# Patient Record
Sex: Male | Born: 1965 | Race: Black or African American | Hispanic: No | Marital: Married | Smoking: Former smoker
Health system: Southern US, Community
[De-identification: ages and names within clinical notes are randomized; demographics above are authoritative.]

## PROBLEM LIST (undated history)

## (undated) DIAGNOSIS — R739 Hyperglycemia, unspecified: Secondary | ICD-10-CM

## (undated) DIAGNOSIS — K219 Gastro-esophageal reflux disease without esophagitis: Secondary | ICD-10-CM

## (undated) DIAGNOSIS — R5383 Other fatigue: Secondary | ICD-10-CM

## (undated) DIAGNOSIS — M1990 Unspecified osteoarthritis, unspecified site: Secondary | ICD-10-CM

## (undated) DIAGNOSIS — E785 Hyperlipidemia, unspecified: Secondary | ICD-10-CM

## (undated) DIAGNOSIS — I10 Essential (primary) hypertension: Secondary | ICD-10-CM

## (undated) DIAGNOSIS — T7840XA Allergy, unspecified, initial encounter: Secondary | ICD-10-CM

## (undated) DIAGNOSIS — E119 Type 2 diabetes mellitus without complications: Secondary | ICD-10-CM

## (undated) DIAGNOSIS — R0602 Shortness of breath: Secondary | ICD-10-CM

## (undated) HISTORY — PX: KNEE ARTHROSCOPY: SUR90

## (undated) HISTORY — DX: Unspecified osteoarthritis, unspecified site: M19.90

## (undated) HISTORY — DX: Other fatigue: R53.83

## (undated) HISTORY — PX: WISDOM TOOTH EXTRACTION: SHX21

## (undated) HISTORY — DX: Type 2 diabetes mellitus without complications: E11.9

## (undated) HISTORY — DX: Hyperlipidemia, unspecified: E78.5

## (undated) HISTORY — DX: Hyperglycemia, unspecified: R73.9

## (undated) HISTORY — DX: Essential (primary) hypertension: I10

## (undated) HISTORY — DX: Allergy, unspecified, initial encounter: T78.40XA

## (undated) HISTORY — DX: Shortness of breath: R06.02

## (undated) HISTORY — PX: COLONOSCOPY: SHX174

## (undated) HISTORY — DX: Gastro-esophageal reflux disease without esophagitis: K21.9

---

## 2004-07-20 ENCOUNTER — Ambulatory Visit: Payer: Self-pay | Admitting: Family Medicine

## 2004-11-24 ENCOUNTER — Ambulatory Visit: Payer: Self-pay | Admitting: Family Medicine

## 2004-11-28 ENCOUNTER — Encounter: Admission: RE | Admit: 2004-11-28 | Discharge: 2004-11-28 | Payer: Self-pay | Admitting: Family Medicine

## 2004-12-08 ENCOUNTER — Ambulatory Visit: Payer: Self-pay | Admitting: Family Medicine

## 2005-03-03 HISTORY — PX: OTHER SURGICAL HISTORY: SHX169

## 2005-03-16 ENCOUNTER — Ambulatory Visit: Payer: Self-pay | Admitting: Family Medicine

## 2005-05-10 ENCOUNTER — Other Ambulatory Visit: Payer: Self-pay

## 2005-05-15 ENCOUNTER — Ambulatory Visit: Payer: Self-pay | Admitting: Orthopedic Surgery

## 2006-05-23 ENCOUNTER — Ambulatory Visit: Payer: Self-pay | Admitting: Family Medicine

## 2006-06-20 ENCOUNTER — Ambulatory Visit: Payer: Self-pay | Admitting: Family Medicine

## 2006-07-02 ENCOUNTER — Ambulatory Visit: Payer: Self-pay | Admitting: Family Medicine

## 2006-07-05 ENCOUNTER — Ambulatory Visit: Payer: Self-pay | Admitting: Family Medicine

## 2006-07-10 ENCOUNTER — Ambulatory Visit: Payer: Self-pay | Admitting: Family Medicine

## 2006-09-11 ENCOUNTER — Ambulatory Visit: Payer: Self-pay | Admitting: Family Medicine

## 2006-11-06 ENCOUNTER — Ambulatory Visit: Payer: Self-pay | Admitting: Family Medicine

## 2007-01-10 ENCOUNTER — Encounter (INDEPENDENT_AMBULATORY_CARE_PROVIDER_SITE_OTHER): Payer: Self-pay | Admitting: *Deleted

## 2007-01-16 ENCOUNTER — Ambulatory Visit: Payer: Self-pay | Admitting: Family Medicine

## 2007-01-16 DIAGNOSIS — E7800 Pure hypercholesterolemia, unspecified: Secondary | ICD-10-CM

## 2007-01-16 DIAGNOSIS — E1169 Type 2 diabetes mellitus with other specified complication: Secondary | ICD-10-CM | POA: Insufficient documentation

## 2007-01-16 DIAGNOSIS — T887XXA Unspecified adverse effect of drug or medicament, initial encounter: Secondary | ICD-10-CM | POA: Insufficient documentation

## 2007-01-17 LAB — CONVERTED CEMR LAB
ALT: 24 units/L (ref 0–40)
AST: 24 units/L (ref 0–37)
Albumin: 4 g/dL (ref 3.5–5.2)
BUN: 12 mg/dL (ref 6–23)
CO2: 30 meq/L (ref 19–32)
Calcium: 9.6 mg/dL (ref 8.4–10.5)
Chloride: 104 meq/L (ref 96–112)
Cholesterol: 201 mg/dL (ref 0–200)
Creatinine, Ser: 1.4 mg/dL (ref 0.4–1.5)
Direct LDL: 121 mg/dL
GFR calc Af Amer: 73 mL/min
GFR calc non Af Amer: 60 mL/min
Glucose, Bld: 106 mg/dL — ABNORMAL HIGH (ref 70–99)
HDL: 39.7 mg/dL (ref >39.0)
Phosphorus: 3.5 mg/dL (ref 2.3–4.6)
Potassium: 3.9 meq/L (ref 3.5–5.1)
Sodium: 139 meq/L (ref 135–145)
Total CHOL/HDL Ratio: 5.1
Triglycerides: 125 mg/dL (ref 0–149)
VLDL: 25 mg/dL (ref 0–40)

## 2007-08-14 DIAGNOSIS — M7120 Synovial cyst of popliteal space [Baker], unspecified knee: Secondary | ICD-10-CM | POA: Insufficient documentation

## 2007-08-14 DIAGNOSIS — I10 Essential (primary) hypertension: Secondary | ICD-10-CM | POA: Insufficient documentation

## 2007-08-14 DIAGNOSIS — J309 Allergic rhinitis, unspecified: Secondary | ICD-10-CM | POA: Insufficient documentation

## 2007-08-14 DIAGNOSIS — G4730 Sleep apnea, unspecified: Secondary | ICD-10-CM | POA: Insufficient documentation

## 2007-08-15 ENCOUNTER — Ambulatory Visit: Payer: Self-pay | Admitting: Family Medicine

## 2007-08-18 LAB — CONVERTED CEMR LAB
ALT: 28 units/L (ref 0–53)
AST: 21 units/L (ref 0–37)
Albumin: 3.7 g/dL (ref 3.5–5.2)
BUN: 15 mg/dL (ref 6–23)
Basophils Absolute: 0 10*3/uL (ref 0.0–0.1)
Basophils Relative: 0.2 % (ref 0.0–1.0)
CO2: 30 meq/L (ref 19–32)
Calcium: 9 mg/dL (ref 8.4–10.5)
Chloride: 105 meq/L (ref 96–112)
Cholesterol: 204 mg/dL (ref 0–200)
Creatinine, Ser: 1.2 mg/dL (ref 0.4–1.5)
Direct LDL: 110 mg/dL
Eosinophils Absolute: 0.2 10*3/uL (ref 0.0–0.6)
Eosinophils Relative: 3.8 % (ref 0.0–5.0)
GFR calc Af Amer: 86 mL/min
GFR calc non Af Amer: 71 mL/min
Glucose, Bld: 101 mg/dL — ABNORMAL HIGH (ref 70–99)
HCT: 42.3 % (ref 39.0–52.0)
HDL: 27 mg/dL — ABNORMAL LOW (ref >39.0)
Hemoglobin: 14.9 g/dL (ref 13.0–17.0)
Lymphocytes Relative: 54.4 % — ABNORMAL HIGH (ref 12.0–46.0)
MCHC: 35.1 g/dL (ref 30.0–36.0)
MCV: 91.6 fL (ref 78.0–100.0)
Monocytes Absolute: 0.7 10*3/uL (ref 0.2–0.7)
Monocytes Relative: 13.8 % — ABNORMAL HIGH (ref 3.0–11.0)
Neutro Abs: 1.5 10*3/uL (ref 1.4–7.7)
Neutrophils Relative %: 27.8 % — ABNORMAL LOW (ref 43.0–77.0)
Phosphorus: 4 mg/dL (ref 2.3–4.6)
Platelets: 217 10*3/uL (ref 150–400)
Potassium: 4 meq/L (ref 3.5–5.1)
RBC: 4.61 M/uL (ref 4.22–5.81)
RDW: 12.7 % (ref 11.5–14.6)
Sodium: 141 meq/L (ref 135–145)
Total CHOL/HDL Ratio: 7.6
Triglycerides: 367 mg/dL (ref 0–149)
VLDL: 73 mg/dL — ABNORMAL HIGH (ref 0–40)
WBC: 5.3 10*3/uL (ref 4.5–10.5)

## 2007-11-17 ENCOUNTER — Ambulatory Visit: Payer: Self-pay | Admitting: Family Medicine

## 2007-11-18 LAB — CONVERTED CEMR LAB
ALT: 26 units/L (ref 0–53)
AST: 21 units/L (ref 0–37)
Cholesterol: 177 mg/dL (ref 0–200)
HDL: 43 mg/dL (ref >39.0)
LDL Cholesterol: 116 mg/dL — ABNORMAL HIGH (ref 0–99)
Total CHOL/HDL Ratio: 4.1
Triglycerides: 88 mg/dL (ref 0–149)
VLDL: 18 mg/dL (ref 0–40)

## 2008-01-09 ENCOUNTER — Telehealth: Payer: Self-pay | Admitting: Family Medicine

## 2008-05-04 ENCOUNTER — Ambulatory Visit: Payer: Self-pay | Admitting: Family Medicine

## 2008-05-06 LAB — CONVERTED CEMR LAB
ALT: 22 units/L (ref 0–53)
AST: 20 units/L (ref 0–37)
Cholesterol: 177 mg/dL (ref 0–200)
HDL: 27.1 mg/dL — ABNORMAL LOW (ref >39.0)
LDL Cholesterol: 129 mg/dL — ABNORMAL HIGH (ref 0–99)
Total CHOL/HDL Ratio: 6.5
Triglycerides: 107 mg/dL (ref 0–149)
VLDL: 21 mg/dL (ref 0–40)

## 2008-10-05 ENCOUNTER — Ambulatory Visit: Payer: Self-pay | Admitting: Family Medicine

## 2008-10-06 LAB — CONVERTED CEMR LAB
ALT: 53 units/L (ref 0–53)
AST: 40 units/L — ABNORMAL HIGH (ref 0–37)
Albumin: 4 g/dL (ref 3.5–5.2)
BUN: 11 mg/dL (ref 6–23)
Basophils Absolute: 0 10*3/uL (ref 0.0–0.1)
Basophils Relative: 0.2 % (ref 0.0–3.0)
CO2: 30 meq/L (ref 19–32)
Calcium: 9.5 mg/dL (ref 8.4–10.5)
Chloride: 109 meq/L (ref 96–112)
Cholesterol: 194 mg/dL (ref 0–200)
Creatinine, Ser: 1.4 mg/dL (ref 0.4–1.5)
Eosinophils Absolute: 0.1 10*3/uL (ref 0.0–0.7)
Eosinophils Relative: 2.7 % (ref 0.0–5.0)
GFR calc Af Amer: 72 mL/min
GFR calc non Af Amer: 59 mL/min
Glucose, Bld: 102 mg/dL — ABNORMAL HIGH (ref 70–99)
HCT: 42.8 % (ref 39.0–52.0)
HDL: 35.8 mg/dL — ABNORMAL LOW (ref >39.0)
Hemoglobin: 15.1 g/dL (ref 13.0–17.0)
LDL Cholesterol: 122 mg/dL — ABNORMAL HIGH (ref 0–99)
Lymphocytes Relative: 42 % (ref 12.0–46.0)
MCHC: 35.2 g/dL (ref 30.0–36.0)
MCV: 93.3 fL (ref 78.0–100.0)
Monocytes Absolute: 0.8 10*3/uL (ref 0.1–1.0)
Monocytes Relative: 16.1 % — ABNORMAL HIGH (ref 3.0–12.0)
Neutro Abs: 1.8 10*3/uL (ref 1.4–7.7)
Neutrophils Relative %: 39 % — ABNORMAL LOW (ref 43.0–77.0)
Phosphorus: 2.8 mg/dL (ref 2.3–4.6)
Platelets: 189 10*3/uL (ref 150–400)
Potassium: 4.1 meq/L (ref 3.5–5.1)
RBC: 4.59 M/uL (ref 4.22–5.81)
RDW: 13.2 % (ref 11.5–14.6)
Sodium: 141 meq/L (ref 135–145)
TSH: 1.25 microintl units/mL (ref 0.35–5.50)
Total CHOL/HDL Ratio: 5.4
Triglycerides: 183 mg/dL — ABNORMAL HIGH (ref 0–149)
VLDL: 37 mg/dL (ref 0–40)
WBC: 4.7 10*3/uL (ref 4.5–10.5)

## 2008-11-04 ENCOUNTER — Encounter: Payer: Self-pay | Admitting: Family Medicine

## 2009-06-21 ENCOUNTER — Ambulatory Visit: Payer: Self-pay | Admitting: Family Medicine

## 2009-07-07 LAB — CONVERTED CEMR LAB
ALT: 43 units/L (ref 0–53)
AST: 32 units/L (ref 0–37)
Albumin: 3.9 g/dL (ref 3.5–5.2)
BUN: 10 mg/dL (ref 6–23)
CO2: 28 meq/L (ref 19–32)
Calcium: 9.1 mg/dL (ref 8.4–10.5)
Chloride: 102 meq/L (ref 96–112)
Cholesterol: 191 mg/dL (ref 0–200)
Creatinine, Ser: 1.3 mg/dL (ref 0.4–1.5)
Direct LDL: 90 mg/dL
GFR calc non Af Amer: 77.76 mL/min (ref >60)
Glucose, Bld: 143 mg/dL — ABNORMAL HIGH (ref 70–99)
HDL: 25.5 mg/dL — ABNORMAL LOW (ref >39.00)
Phosphorus: 2.9 mg/dL (ref 2.3–4.6)
Potassium: 3.9 meq/L (ref 3.5–5.1)
Sodium: 139 meq/L (ref 135–145)
Total CHOL/HDL Ratio: 7
Triglycerides: 588 mg/dL — ABNORMAL HIGH (ref 0.0–149.0)
VLDL: 117.6 mg/dL — ABNORMAL HIGH (ref 0.0–40.0)

## 2009-08-05 ENCOUNTER — Ambulatory Visit: Payer: Self-pay | Admitting: Family Medicine

## 2009-08-05 DIAGNOSIS — R739 Hyperglycemia, unspecified: Secondary | ICD-10-CM | POA: Insufficient documentation

## 2009-08-06 LAB — CONVERTED CEMR LAB
ALT: 27 units/L (ref 0–53)
AST: 21 units/L (ref 0–37)
Cholesterol: 177 mg/dL (ref 0–200)
HDL: 34 mg/dL — ABNORMAL LOW (ref >39.00)
Hgb A1c MFr Bld: 6 % (ref 4.6–6.5)
LDL Cholesterol: 107 mg/dL — ABNORMAL HIGH (ref 0–99)
Total CHOL/HDL Ratio: 5
Triglycerides: 181 mg/dL — ABNORMAL HIGH (ref 0.0–149.0)
VLDL: 36.2 mg/dL (ref 0.0–40.0)

## 2009-08-12 ENCOUNTER — Ambulatory Visit: Payer: Self-pay | Admitting: Family Medicine

## 2009-08-12 LAB — HM DIABETES FOOT EXAM

## 2010-03-07 ENCOUNTER — Ambulatory Visit: Payer: Self-pay | Admitting: Family Medicine

## 2010-03-07 LAB — CONVERTED CEMR LAB
ALT: 31 units/L (ref 0–53)
AST: 23 units/L (ref 0–37)
Albumin: 4.1 g/dL (ref 3.5–5.2)
BUN: 21 mg/dL (ref 6–23)
CO2: 29 meq/L (ref 19–32)
Calcium: 9.1 mg/dL (ref 8.4–10.5)
Chloride: 108 meq/L (ref 96–112)
Cholesterol: 202 mg/dL — ABNORMAL HIGH (ref 0–200)
Creatinine, Ser: 1.4 mg/dL (ref 0.4–1.5)
Direct LDL: 88.2 mg/dL
GFR calc non Af Amer: 72.95 mL/min (ref >60)
Glucose, Bld: 103 mg/dL — ABNORMAL HIGH (ref 70–99)
HDL: 35 mg/dL — ABNORMAL LOW (ref >39.00)
Hgb A1c MFr Bld: 6.1 % (ref 4.6–6.5)
Phosphorus: 4 mg/dL (ref 2.3–4.6)
Potassium: 4 meq/L (ref 3.5–5.1)
Sodium: 140 meq/L (ref 135–145)
Total CHOL/HDL Ratio: 6
Triglycerides: 534 mg/dL — ABNORMAL HIGH (ref 0.0–149.0)
VLDL: 106.8 mg/dL — ABNORMAL HIGH (ref 0.0–40.0)

## 2010-03-14 ENCOUNTER — Ambulatory Visit: Payer: Self-pay | Admitting: Family Medicine

## 2010-03-20 ENCOUNTER — Encounter: Payer: Self-pay | Admitting: Family Medicine

## 2010-04-26 ENCOUNTER — Encounter
Admission: RE | Admit: 2010-04-26 | Discharge: 2010-06-01 | Payer: Self-pay | Source: Home / Self Care | Admitting: Family Medicine

## 2010-04-26 ENCOUNTER — Encounter: Payer: Self-pay | Admitting: Family Medicine

## 2010-06-19 ENCOUNTER — Ambulatory Visit: Payer: Self-pay | Admitting: Family Medicine

## 2010-06-19 LAB — CONVERTED CEMR LAB
ALT: 26 units/L (ref 0–53)
AST: 21 units/L (ref 0–37)
Albumin: 3.8 g/dL (ref 3.5–5.2)
BUN: 18 mg/dL (ref 6–23)
CO2: 27 meq/L (ref 19–32)
Calcium: 9.5 mg/dL (ref 8.4–10.5)
Chloride: 106 meq/L (ref 96–112)
Creatinine, Ser: 1.3 mg/dL (ref 0.4–1.5)
GFR calc non Af Amer: 76.04 mL/min (ref >60)
Glucose, Bld: 92 mg/dL (ref 70–99)
Hgb A1c MFr Bld: 6.3 % (ref 4.6–6.5)
Phosphorus: 4.5 mg/dL (ref 2.3–4.6)
Potassium: 3.8 meq/L (ref 3.5–5.1)
Sodium: 140 meq/L (ref 135–145)

## 2010-06-26 ENCOUNTER — Ambulatory Visit: Payer: Self-pay | Admitting: Family Medicine

## 2010-07-06 ENCOUNTER — Ambulatory Visit: Payer: Self-pay | Admitting: Family Medicine

## 2010-07-06 DIAGNOSIS — M25569 Pain in unspecified knee: Secondary | ICD-10-CM | POA: Insufficient documentation

## 2010-07-07 LAB — CONVERTED CEMR LAB
Cholesterol: 200 mg/dL (ref 0–200)
Direct LDL: 112.3 mg/dL
HDL: 38 mg/dL — ABNORMAL LOW (ref >39.00)
Total CHOL/HDL Ratio: 5
Triglycerides: 289 mg/dL — ABNORMAL HIGH (ref 0.0–149.0)
VLDL: 57.8 mg/dL — ABNORMAL HIGH (ref 0.0–40.0)

## 2010-09-06 ENCOUNTER — Ambulatory Visit
Admission: RE | Admit: 2010-09-06 | Discharge: 2010-09-06 | Payer: Self-pay | Source: Home / Self Care | Attending: Family Medicine | Admitting: Family Medicine

## 2010-10-03 NOTE — Miscellaneous (Signed)
Summary: change tricor to generic fenoibrate due to cost  Clinical Lists Changes  Medications: Removed medication of TRICOR 48 MG  TABS (FENOFIBRATE) take one by mouth once daily Added new medication of LOFIBRA 54 MG TABS (FENOFIBRATE) 1 by mouth once daily - Signed Rx of LOFIBRA 54 MG TABS (FENOFIBRATE) 1 by mouth once daily;  #90 x 1;  Signed;  Entered by: Judith Part MD;  Authorized by: Colon Flattery Darrian Goodwill MD;  Method used: Printed then faxed to    Prescriptions: LOFIBRA 54 MG TABS (FENOFIBRATE) 1 by mouth once daily  #90 x 1   Entered and Authorized by:   Judith Part MD   Signed by:   Judith Part MD on 11/04/2008   Method used:   Printed then faxed to ...         RxID:   [id number]

## 2010-10-03 NOTE — Assessment & Plan Note (Signed)
Summary: FOLLOW UP AFTER LABS/RI   Vital Signs:  Patient profile:   45 year old male Weight:      262 pounds BMI:     35.66 Temp:     98 degrees F oral Pulse rate:   80 / minute Pulse rhythm:   regular BP sitting:   124 / 100  (left arm) Cuff size:   large  Vitals Entered By: Lowella Petties CMA (August 12, 2009 9:09 AM) CC: follow-up visit   History of Present Illness: here for f/u of HTN and lipids and new hyperglycemia   has been feeling good in general   124/100- bp is up today -- but has not taken his med yet-, that is why  does not think it has been high no headaches or flushing or chest pain no chane in his stress level   lipids much imp with trig 181 (down from 588 ) on med, HDL 34 adn LDL 107 diet -- was good until thanksgiving -- now better  is trying to stay away from fatty foods  more salad lately  no trouble with the lofibra at all   exercise--more in general than he was but needs to do better for wt loss  AIC 6.0 no DM in family  is a big sweet eater -- eats snickers bars -- occ up to 2-3 per day  does drink a fair amt of soda -- not diet (lately almost every day )   no numbness in feet or hands no thirst or inc urination      Allergies (verified): No Known Drug Allergies  Past History:  Past Surgical History: Last updated: 08/14/2007 Sleep study- mild apnea (11/2004) Baker's cyst- aspirated (03/2005)  Family History: Last updated: 08/14/2007 Father: ETOH Mother: heart problems, thyroid problems, HTN, brain tumor- cancer, died Siblings: none  Social History: Last updated: 05/04/2008 Marital Status:single Children: 2 Occupation: [employer] non smoker  Risk Factors: Smoking Status: current (08/14/2007)  Past Medical History: Allergic rhinitis Hypertension hyperlipidemia hyperglycemia/ mild DM  Review of Systems General:  Denies chills, fatigue, fever, loss of appetite, and malaise. Eyes:  Denies blurring and eye  irritation. CV:  Denies chest pain or discomfort, lightheadness, palpitations, and shortness of breath with exertion. Resp:  Denies cough and wheezing. GI:  Denies abdominal pain, bloody stools, and change in bowel habits. MS:  Denies joint pain and muscle aches. Derm:  Denies lesion(s), poor wound healing, and rash. Neuro:  Denies numbness and tingling. Endo:  Denies cold intolerance, excessive thirst, excessive urination, and heat intolerance. Heme:  Denies abnormal bruising and bleeding.  Physical Exam  General:  Well-developed,well-nourished,in no acute distress; alert,appropriate and cooperative throughout examination Head:  normocephalic, atraumatic, and no abnormalities observed.   Eyes:  vision grossly intact, pupils equal, pupils round, and pupils reactive to light.   Mouth:  pharynx pink and moist.   Neck:  supple with full rom and no masses or thyromegally, no JVD or carotid bruit  Lungs:  Normal respiratory effort, chest expands symmetrically. Lungs are clear to auscultation, no crackles or wheezes. Heart:  Normal rate and regular rhythm. S1 and S2 normal without gallop, murmur, click, rub or other extra sounds. Msk:  No deformity or scoliosis noted of thoracic or lumbar spine.   Pulses:  R and L carotid,radial,femoral,dorsalis pedis and posterior tibial pulses are full and equal bilaterally Extremities:  No clubbing, cyanosis, edema, or deformity noted with normal full range of motion of all joints.   Neurologic:  sensation intact to light touch, gait normal, and DTRs symmetrical and normal.   Skin:  Intact without suspicious lesions or rashes Cervical Nodes:  No lymphadenopathy noted Psych:  normal affect, talkative and pleasant   Diabetes Management Exam:    Foot Exam (with socks and/or shoes not present):       Sensory-Pinprick/Light touch:          Left medial foot (L-4): normal          Left dorsal foot (L-5): normal          Left lateral foot (S-1): normal           Right medial foot (L-4): normal          Right dorsal foot (L-5): normal          Right lateral foot (S-1): normal       Sensory-Monofilament:          Left foot: normal          Right foot: normal       Inspection:          Left foot: normal          Right foot: normal       Nails:          Left foot: normal          Right foot: normal   Impression & Recommendations:  Problem # 1:  HYPERGLYCEMIA (ICD-790.29) Assessment Deteriorated new hyperglycemia -- with AIC 6.0 disc poss of borderline DM and what to do for that disc healthy diet (low simple sugar/ choose complex carbs/ low sat fat) diet and exercise in detail  pt is sure he can cut out sweets and soda and cut down carbs  plan to recheck in 6 mo and f/u  will work on 10-20 lb wt loss in that time with exercise   Problem # 2:  HYPERTENSION (ICD-401.9) Assessment: Deteriorated  bp up today because he forgot his med -- counseled on compliance disc low sodium diet and exercise lab and f/u 6 mo planned  His updated medication list for this problem includes:    Triamterene-hctz 37.5-25 Mg Tabs (Triamterene-hctz) .Marland Kitchen... Take one by mouth once daily  BP today: 124/100 Prior BP: 136/84 (06/21/2009)  Labs Reviewed: K+: 3.9 (06/21/2009) Creat: : 1.3 (06/21/2009)   Chol: 177 (08/05/2009)   HDL: 34.00 (08/05/2009)   LDL: 107 (08/05/2009)   TG: 181.0 (08/05/2009)  Problem # 3:  HYPERCHOLESTEROLEMIA (ICD-272.0) Assessment: Improved  much improved with lofibra -- trig are much better rev low fat diet will continue this and then re check 6 mo and f/u His updated medication list for this problem includes:    Lofibra 134 Mg Caps (Fenofibrate micronized) .Marland Kitchen... 1 by mouth once daily  Labs Reviewed: SGOT: 21 (08/05/2009)   SGPT: 27 (08/05/2009)   HDL:34.00 (08/05/2009), 25.50 (06/21/2009)  LDL:107 (08/05/2009), 122 (10/05/2008)  Chol:177 (08/05/2009), 191 (06/21/2009)  Trig:181.0 (08/05/2009), 588.0 (06/21/2009)  Complete  Medication List: 1)  Triamterene-hctz 37.5-25 Mg Tabs (Triamterene-hctz) .... Take one by mouth once daily 2)  Allegra 180 Mg Tabs (Fexofenadine hcl) .... Take one by mouth once daily 3)  Aspirin Coated 81mg   .... 1 by mouth once daily 4)  Lofibra 134 Mg Caps (Fenofibrate micronized) .Marland Kitchen.. 1 by mouth once daily 5)  Multivitamins Tabs (Multiple vitamin) .... One daily  Patient Instructions: 1)  avoid sweets and sweet drinks entirely (artifical sweetner is ok)  2)  keep watching fats in diet  too  3)  schedule fasting lab and then f/u in 6 mo lipid/ast/alt/renalt/AIC 792.9, 272  4)  for your follow up make sure to take your medication that day 5)  get back to regular exercise   Prior Medications (reviewed today): TRIAMTERENE-HCTZ 37.5-25 MG  TABS (TRIAMTERENE-HCTZ) take one by mouth once daily ALLEGRA 180 MG  TABS (FEXOFENADINE HCL) take one by mouth once daily ASPIRIN COATED 81MG  () 1 by mouth once daily LOFIBRA 134 MG CAPS (FENOFIBRATE MICRONIZED) 1 by mouth once daily MULTIVITAMINS   TABS (MULTIPLE VITAMIN) one daily Current Allergies (reviewed today): No known allergies

## 2010-10-03 NOTE — Assessment & Plan Note (Signed)
Summary: REFILL MEDICATION/CLE   Vital Signs:  Patient Profile:   45 Years Old Male Weight:      247 pounds Temp:     98 degrees F oral Pulse rate:   84 / minute Pulse rhythm:   regular BP sitting:   130 / 92  (left arm) Cuff size:   large  Vitals Entered By: Lowella Petties (August 15, 2007 8:56 AM)                 Chief Complaint:  Refill meds.  History of Present Illness: has been doing ok, no complaints has gained 10 lb with eating more, and not exercising does want to change his lifestyle at all  some headaches lately- not generally in the ams, and not severe (? if bp could be up)  did not end up getting cpap for sleep apnea does not know if he would consider ENT eval or sx in future   Serial Vital Signs/Assessments:  Time      Position  BP       Pulse  Resp  Temp     By                     132/85                         Judith Part MD  Current Allergies: No known allergies      Review of Systems      See HPI  General      Denies chills, fatigue, and weight loss.  Eyes      Denies blurring.  CV      Denies chest pain or discomfort.  Resp      Denies cough and shortness of breath.  Derm      Denies rash.  Psych      mood has been fine   Physical Exam  General:     overweight but generally well appearing  Head:     normocephalic, atraumatic, and no abnormalities observed.   Eyes:     vision grossly intact, pupils equal, pupils round, and pupils reactive to light.  no sinus tenderness Nose:     no nasal discharge.   Mouth:     pharynx pink and moist.   Neck:     supple with full rom and no masses or thyromegally, no JVD or carotid bruit  Lungs:     Normal respiratory effort, chest expands symmetrically. Lungs are clear to auscultation, no crackles or wheezes. Heart:     Normal rate and regular rhythm. S1 and S2 normal without gallop, murmur, click, rub or other extra sounds. Abdomen:     soft, nt no renal  bruits Pulses:     R and L carotid,radial,femoral,dorsalis pedis and posterior tibial pulses are full and equal bilaterally Extremities:     No clubbing, cyanosis, edema, or deformity noted with normal full range of motion of all joints.   Neurologic:     sensation intact to light touch, gait normal, and DTRs symmetrical and normal.  no tremor Skin:     Intact without suspicious lesions or rashes Cervical Nodes:     No lymphadenopathy noted Psych:     nl affect, pleasant    Impression & Recommendations:  Problem # 1:  HYPERTENSION (ICD-401.9) blood pressure is borderline and pt has been getting ha will start checking at home if able will start better lifestyle habits  and avoid sodium and caffiene update if no imp in ha- or other changes med refil, labs ordered flu shot His updated medication list for this problem includes:    Triamterene-hctz 37.5-25 Mg Tabs (Triamterene-hctz) .Marland Kitchen... Take one by mouth qd  Orders: Venipuncture (64332) TLB-Renal Function Panel (80069-RENAL) TLB-CBC Platelet - w/Differential (85025-CBCD)   Problem # 2:  HYPERCHOLESTEROLEMIA (ICD-272.0) will check labs- diet may not be as good counseled on low fat diet for chol and wt loss tricor refilled His updated medication list for this problem includes:    Tricor 48 Mg Tabs (Fenofibrate) .Marland Kitchen... Take one by mouth qd  Orders: Venipuncture (95188) TLB-Lipid Panel (80061-LIPID) TLB-ALT (SGPT) (84460-ALT) TLB-AST (SGOT) (84450-SGOT)   Complete Medication List: 1)  Triamterene-hctz 37.5-25 Mg Tabs (Triamterene-hctz) .... Take one by mouth qd 2)  Tricor 48 Mg Tabs (Fenofibrate) .... Take one by mouth qd 3)  Allegra 180 Mg Tabs (Fexofenadine hcl) .... Take one by mouth qd  Other Orders: Flu Vaccine 72yrs + (41660) Admin 1st Vaccine (63016)   Patient Instructions: 1)  if you want to start checking your blood pressure at home- the cuff to get is called OMRON - arm cuff you can buy at pharmacy 2)  let  me know if blood pressure is staying above 140/90 3)  let me know if headache does not get better with better blood pressure and diet and exercise and caffiene avoidance 4)  we will check labs today 5)  follow up in 3months- or earlier if headaches persist    Prescriptions: ALLEGRA 180 MG  TABS (FEXOFENADINE HCL) take one by mouth qd  #90 x 3   Entered and Authorized by:   Judith Part MD   Signed by:   Judith Part MD on 08/15/2007   Method used:   Print then Give to Patient   RxID:   [id number] TRICOR 48 MG  TABS (FENOFIBRATE) take one by mouth qd  #90 x 3   Entered and Authorized by:   Judith Part MD   Signed by:   Judith Part MD on 08/15/2007   Method used:   Print then Give to Patient   RxID:   [id number] TRIAMTERENE-HCTZ 37.5-25 MG  TABS (TRIAMTERENE-HCTZ) take one by mouth qd  #90 x 3   Entered and Authorized by:   Judith Part MD   Signed by:   Judith Part MD on 08/15/2007   Method used:   Print then Give to Patient   RxID:   [id number]  ] Prior Medications (reviewed today): TRIAMTERENE-HCTZ 37.5-25 MG  TABS (TRIAMTERENE-HCTZ) take one by mouth qd TRICOR 48 MG  TABS (FENOFIBRATE) take one by mouth qd ALLEGRA 180 MG  TABS (FEXOFENADINE HCL) take one by mouth qd Current Allergies: No known allergies     Influenza Vaccine    Vaccine Type: Fluvax 3+    Site: left deltoid    Mfr: Sanofi Pasteur    Dose: 0.5 ml    Route: IM    Given by: Lowella Petties    Exp. Date: 03/02/2008    Lot #: [id number]    VIS given: 03/27/07 version given August 15, 2007.  Flu Vaccine Consent Questions    Do you have a history of severe allergic reactions to this vaccine? no    Any prior history of allergic reactions to egg and/or gelatin? no    Do you have a sensitivity to the preservative Thimersol? no    Do you have a  past history of Guillan-Barre Syndrome? no    Do you currently have an acute febrile illness? no    Have you ever had a severe  reaction to latex? no    Vaccine information given and explained to patient? yes

## 2010-10-03 NOTE — Assessment & Plan Note (Signed)
Summary: F/U BLOOD PRESSURE/CLE   Vital Signs:  Patient Profile:   45 Years Old Male Weight:      249 pounds Temp:     97.9 degrees F oral Pulse rate:   72 / minute Pulse rhythm:   regular BP sitting:   122 / 90  (left arm) Cuff size:   large  Vitals Entered By: Liane Comber (October 05, 2008 10:17 AM)                 Chief Complaint:  f/u blood pressure.  History of Present Illness: weight is up significantly  is eating worse - everything - especially over holidays   needs chol check - has tried to cut down on fried foods - not a lot of eggs  some steak occasionally  last LDL 120s , HDL 27  has been taking fish oil pills - they do give hime side eff (GI fish taste) takes one per day   no change in meds  no headaches or chest pain (except this weekend - had headache- better now) no edema    not much exercise lately has plan to change - is going to start exercise - ride bicycle and stair master - wants to start after work for 45 min daily-- this helped him loose wt in the past   has a knot under his chin - hurt weeks ago- but not now ? if drained  did have a cold sore on lip - this probably caused it     Current Allergies (reviewed today): No known allergies   Past Medical History:    Reviewed history from 11/17/2007 and no changes required:       Allergic rhinitis       Hypertension       hyperlipidemia  Past Surgical History:    Reviewed history from 08/14/2007 and no changes required:       Sleep study- mild apnea (11/2004)       Baker's cyst- aspirated (03/2005)   Family History:    Reviewed history from 08/14/2007 and no changes required:       Father: ETOH       Mother: heart problems, thyroid problems, HTN, brain tumor- cancer, died       Siblings: none  Social History:    Reviewed history from 05/04/2008 and no changes required:       Marital Status:single       Children: 2       Occupation: [employer]       non  smoker    Review of Systems  General      Denies fatigue, loss of appetite, and malaise.  Eyes      Denies blurring and eye irritation.  ENT      Denies nasal congestion and sore throat.  CV      Denies chest pain or discomfort, lightheadness, palpitations, and shortness of breath with exertion.  Resp      Denies cough, pleuritic, and wheezing.  GI      Denies change in bowel habits.  Derm      Denies itching and rash.  Neuro      Denies numbness and tingling.  Psych      mood is ok   Endo      Denies excessive thirst and excessive urination.   Physical Exam  General:     overweight but generally well appearing  Head:     normocephalic, atraumatic, and  no abnormalities observed.   Eyes:     vision grossly intact, pupils equal, pupils round, and pupils reactive to light.   Neck:     supple with full rom and no masses or thyromegally, no JVD or carotid bruit  Chest Wall:     No deformities, masses, tenderness or gynecomastia noted. Lungs:     Normal respiratory effort, chest expands symmetrically. Lungs are clear to auscultation, no crackles or wheezes. Heart:     Normal rate and regular rhythm. S1 and S2 normal without gallop, murmur, click, rub or other extra sounds. Abdomen:     soft, non-tender, and normal bowel sounds.  no renal bruits  Msk:     No deformity or scoliosis noted of thoracic or lumbar spine.   Pulses:     R and L carotid,radial,femoral,dorsalis pedis and posterior tibial pulses are full and equal bilaterally Extremities:     No clubbing, cyanosis, edema, or deformity noted with normal full range of motion of all joints.   Neurologic:     sensation intact to light touch, gait normal, and DTRs symmetrical and normal.   Skin:     Intact without suspicious lesions or rashes Cervical Nodes:     No lymphadenopathy noted Psych:     normal affect, talkative and pleasant     Impression & Recommendations:  Problem # 1:  HYPERTENSION  (ICD-401.9) Assessment: Unchanged continues to be in good control wtih triam/ hct labs today talked in length about diet and exercise plan - for wt loss also  f/u 6 mo  His updated medication list for this problem includes:    Triamterene-hctz 37.5-25 Mg Tabs (Triamterene-hctz) .Marland Kitchen... Take one by mouth once daily  BP today: 122/90-- re check 120/85 Prior BP: 128/80 (05/04/2008)  Labs Reviewed: Creat: 1.2 (08/15/2007) Chol: 177 (05/04/2008)   HDL: 27.1 (05/04/2008)   LDL: 129 (05/04/2008)   TG: 107 (05/04/2008)  Orders: Venipuncture (16109) TLB-Lipid Panel (80061-LIPID) TLB-Renal Function Panel (80069-RENAL) TLB-CBC Platelet - w/Differential (85025-CBCD) TLB-TSH (Thyroid Stimulating Hormone) (84443-TSH) TLB-ALT (SGPT) (84460-ALT) TLB-AST (SGOT) (84450-SGOT)   Problem # 2:  HYPERCHOLESTEROLEMIA (ICD-272.0) Assessment: Deteriorated tricor and diet are controlling trig well- but LDL too high and HDL low consider niaspan in future  will switch to flax seed oil for omega 3 since fish oil is not well tolerated  exercise /diet disc labs today and adv  His updated medication list for this problem includes:    Tricor 48 Mg Tabs (Fenofibrate) .Marland Kitchen... Take one by mouth once daily  Orders: Venipuncture (60454) TLB-Lipid Panel (80061-LIPID) TLB-Renal Function Panel (80069-RENAL) TLB-CBC Platelet - w/Differential (85025-CBCD) TLB-TSH (Thyroid Stimulating Hormone) (84443-TSH) TLB-ALT (SGPT) (84460-ALT) TLB-AST (SGOT) (84450-SGOT)   Problem # 3:  ALLERGIC RHINITIS (ICD-477.9) Assessment: Unchanged refil allegra- works well for him nl exam- no lump palp in submandibular area- adv to update if this returns (suspect was swollen LN from cold sore)  His updated medication list for this problem includes:    Allegra 180 Mg Tabs (Fexofenadine hcl) .Marland Kitchen... Take one by mouth once daily   Complete Medication List: 1)  Triamterene-hctz 37.5-25 Mg Tabs (Triamterene-hctz) .... Take one by mouth  once daily 2)  Tricor 48 Mg Tabs (Fenofibrate) .... Take one by mouth once daily 3)  Allegra 180 Mg Tabs (Fexofenadine hcl) .... Take one by mouth once daily 4)  Aspirin Coated 81mg   .... 1 by mouth once daily   Serial Vital Signs/Assessments:  Time      Position  BP  Pulse  Resp  Temp     By                     120/85                         Judith Part MD   Patient Instructions: 1)  start regular exercise for good health and weight loss  2)  eat healthy low fat diet 3)  try flax seed oil supplement instead of fish oil - to raise HDL/ good cholesterol (since fish oil gives you side effects) 4)  start coated baby aspirin 81 mg 1 pill daily with food  5)  labs today including cholesterol / kidney and liver function 6)  follow up in about 6 months   Prescriptions: ALLEGRA 180 MG  TABS (FEXOFENADINE HCL) take one by mouth once daily  #90 x 3   Entered and Authorized by:   Judith Part MD   Signed by:   Judith Part MD on 10/05/2008   Method used:   Print then Give to Patient   RxID:   [id number] TRICOR 48 MG  TABS (FENOFIBRATE) take one by mouth once daily  #90 x 3   Entered and Authorized by:   Judith Part MD   Signed by:   Judith Part MD on 10/05/2008   Method used:   Print then Give to Patient   RxID:   [id number] TRIAMTERENE-HCTZ 37.5-25 MG  TABS (TRIAMTERENE-HCTZ) take one by mouth once daily  #90 x 3   Entered and Authorized by:   Judith Part MD   Signed by:   Judith Part MD on 10/05/2008   Method used:   Print then Give to Patient   RxID:   [id number]

## 2010-10-03 NOTE — Consult Note (Signed)
Summary: [facility]  [facility]   Imported By: Lanelle Bal 05/16/2010 14:18:09  _____________________________________________________________________  External Attachment:    Type:   Image     Comment:   External Document

## 2010-10-03 NOTE — Progress Notes (Signed)
Summary: rx chantix  Phone Note Refill Request Message from:  [employer] on Jan 09, 2008 2:01 PM  Refills Requested: Medication #1:  CHANTIX 1 MG  TABS take as directed.   Last Refilled: 05/01/2007 refill at [facility] [phone]  Initial call taken by: Providence Crosby,  Jan 09, 2008 2:03 PM  Follow-up for Phone Call        px written on EMR for call in  Follow-up by: Judith Part MD,  Jan 09, 2008 2:08 PM  Additional Follow-up for Phone Call Additional follow up Details #1::        called to [facility] Additional Follow-up by: Lowella Petties,  Jan 09, 2008 2:29 PM    New/Updated Medications: CHANTIX 1 MG  TABS (VARENICLINE TARTRATE) take 1 by mouth two times a day as directed   Prescriptions: CHANTIX 1 MG  TABS (VARENICLINE TARTRATE) take 1 by mouth two times a day as directed  #60 x 3   Entered by:   Judith Part MD   Authorized by:   Providence Crosby   Signed by:   Judith Part MD on 01/09/2008   Method used:   Telephoned to ...         RxID:   [id number]

## 2010-10-03 NOTE — Assessment & Plan Note (Signed)
Summary: fu BP   Vital Signs:  Patient profile:   45 year old male Height:      72 inches Weight:      261 pounds BMI:     35.53 Temp:     98.7 degrees F oral Pulse rate:   92 / minute Pulse rhythm:   regular BP sitting:   136 / 84  (left arm) Cuff size:   large  Vitals Entered By: Lewanda Rife LPN (June 21, 2009 10:14 AM)  Serial Vital Signs/Assessments:  Time      Position  BP       Pulse  Resp  Temp     By                     130/82                         Judith Part MD  CC: f/u blood pressure   History of Present Illness: here for f/u of HTN and chol  wt is up from 249 to 261 since last visit  has not been exercising as much  thinks he eats the same to less   bp is up a little 136/84   is feeling ok   chaged his tricor to generic - with no problems is avoiding fatty foods   flu shot today       Allergies (verified): No Known Drug Allergies  Past History:  Past Medical History: Last updated: 11/17/2007 Allergic rhinitis Hypertension hyperlipidemia  Past Surgical History: Last updated: 08/14/2007 Sleep study- mild apnea (11/2004) Baker's cyst- aspirated (03/2005)  Family History: Last updated: 08/14/2007 Father: ETOH Mother: heart problems, thyroid problems, HTN, brain tumor- cancer, died Siblings: none  Social History: Last updated: 05/04/2008 Marital Status:single Children: 2 Occupation: [employer] non smoker  Risk Factors: Smoking Status: current (08/14/2007)  Review of Systems General:  Denies fatigue, fever, loss of appetite, and malaise. Eyes:  Denies blurring and eye pain. CV:  Denies chest pain or discomfort, palpitations, shortness of breath with exertion, and swelling of feet. Resp:  Denies cough and wheezing. GI:  Denies abdominal pain and change in bowel habits. MS:  Denies joint pain and stiffness. Derm:  Denies poor wound healing and rash. Neuro:  Denies headaches, numbness, and tingling. Psych:  mood is  good . Endo:  Denies excessive thirst and excessive urination.  Physical Exam  General:  overweight but generally well appearing  Head:  normocephalic, atraumatic, and no abnormalities observed.   Eyes:  vision grossly intact, pupils equal, pupils round, and pupils reactive to light.   Mouth:  pharynx pink and moist, no erythema, and no exudates.   Neck:  supple with full rom and no masses or thyromegally, no JVD or carotid bruit  Lungs:  Normal respiratory effort, chest expands symmetrically. Lungs are clear to auscultation, no crackles or wheezes. Heart:  Normal rate and regular rhythm. S1 and S2 normal without gallop, murmur, click, rub or other extra sounds. Msk:  No deformity or scoliosis noted of thoracic or lumbar spine.   Extremities:  No clubbing, cyanosis, edema, or deformity noted with normal full range of motion of all joints.   Neurologic:  sensation intact to light touch, gait normal, and DTRs symmetrical and normal.   Skin:  Intact without suspicious lesions or rashes Cervical Nodes:  No lymphadenopathy noted Psych:  normal affect, talkative and pleasant    Impression & Recommendations:  Problem # 1:  HYPERTENSION (ICD-401.9) Assessment Unchanged  with stable control on current med lab today  disc imp of regular exercise and wt loss back to baseline His updated medication list for this problem includes:    Triamterene-hctz 37.5-25 Mg Tabs (Triamterene-hctz) .Marland Kitchen... Take one by mouth once daily  Orders: Venipuncture (04540) TLB-Lipid Panel (80061-LIPID) TLB-Renal Function Panel (80069-RENAL) TLB-ALT (SGPT) (84460-ALT) TLB-AST (SGOT) (84450-SGOT)  BP today: 136/84--130/82 re check Prior BP: 122/90 (10/05/2008)  Labs Reviewed: K+: 4.1 (10/05/2008) Creat: : 1.4 (10/05/2008)   Chol: 194 (10/05/2008)   HDL: 35.8 (10/05/2008)   LDL: 122 (10/05/2008)   TG: 183 (10/05/2008)  Problem # 2:  HYPERCHOLESTEROLEMIA (ICD-272.0) Assessment: Unchanged  fairly controlled -  now changed to generic tricor  lab today may need to work on HDL and LDL  disc low sat fat diet disc imp of exercise  His updated medication list for this problem includes:    Lofibra 54 Mg Tabs (Fenofibrate) .Marland Kitchen... 1 by mouth once daily  Orders: Venipuncture (98119) TLB-Lipid Panel (80061-LIPID) TLB-Renal Function Panel (80069-RENAL) TLB-ALT (SGPT) (84460-ALT) TLB-AST (SGOT) (84450-SGOT)  Labs Reviewed: SGOT: 40 (10/05/2008)   SGPT: 53 (10/05/2008)   HDL:35.8 (10/05/2008), 27.1 (05/04/2008)  LDL:122 (10/05/2008), 129 (05/04/2008)  Chol:194 (10/05/2008), 177 (05/04/2008)  Trig:183 (10/05/2008), 107 (05/04/2008)  Complete Medication List: 1)  Triamterene-hctz 37.5-25 Mg Tabs (Triamterene-hctz) .... Take one by mouth once daily 2)  Allegra 180 Mg Tabs (Fexofenadine hcl) .... Take one by mouth once daily 3)  Aspirin Coated 81mg   .... 1 by mouth once daily 4)  Lofibra 54 Mg Tabs (Fenofibrate) .Marland Kitchen.. 1 by mouth once daily 5)  Multivitamins Tabs (Multiple vitamin) .... One daily  Other Orders: Flu Vaccine 14yrs + (14782) Admin 1st Vaccine (95621)  Patient Instructions: 1)  keep watching diet for fats and sugar 2)  get back into regular exercise - 30 minutes or more 5 days per week  3)  flu shot today 4)  labs today  Prescriptions: ALLEGRA 180 MG  TABS (FEXOFENADINE HCL) take one by mouth once daily  #90 x 3   Entered and Authorized by:   Judith Part MD   Signed by:   Judith Part MD on 06/21/2009   Method used:   Print then Give to Patient   RxID:   [id number] LOFIBRA 54 MG TABS (FENOFIBRATE) 1 by mouth once daily  #90 x 3   Entered and Authorized by:   Judith Part MD   Signed by:   Judith Part MD on 06/21/2009   Method used:   Print then Give to Patient   RxID:   [id number] TRIAMTERENE-HCTZ 37.5-25 MG  TABS (TRIAMTERENE-HCTZ) take one by mouth once daily  #90 x 3   Entered and Authorized by:   Judith Part MD   Signed by:   Judith Part MD on  06/21/2009   Method used:   Print then Give to Patient   RxID:   [id number]   Current Allergies (reviewed today): No known allergies    Immunizations Administered:  Influenza Vaccine # 1:    Vaccine Type: Fluvax 3+    Site: left deltoid    Mfr: GlaxoSmithKline    Dose: 0.5 ml    Route: IM    Given by: Lewanda Rife LPN    Exp. Date: 03/02/2010    Lot #: [id number]    VIS given: 03/27/07 version given June 21, 2009.  Flu Vaccine Consent Questions:    Do you  have a history of severe allergic reactions to this vaccine? no    Any prior history of allergic reactions to egg and/or gelatin? no    Do you have a sensitivity to the preservative Thimersol? no    Do you have a past history of Guillan-Barre Syndrome? no    Do you currently have an acute febrile illness? no    Have you ever had a severe reaction to latex? no    Vaccine information given and explained to patient? yes

## 2010-10-03 NOTE — Assessment & Plan Note (Signed)
Summary: F/U AFTER LABS / [facility]  R/S 6/20   Vital Signs:  Patient profile:   45 year old male Height:      72 inches Weight:      271.75 pounds BMI:     36.99 Temp:     98 degrees F oral Pulse rate:   76 / minute Pulse rhythm:   regular BP sitting:   128 / 92  (left arm) Cuff size:   large  Vitals Entered By: Lewanda Rife LPN (March 14, 2010 8:21 AM) CC: six month f/u after labs   History of Present Illness: here for f/u of HTN and lipids and hyperglycemia   wt is up 9 lb  bp is 128/92 today- much imp   trig up again 534 with lofibra -- LDL 88 has not missed any doses of medicine   has made some effort for diet - but not entirely dedicated to it  has cut out fried food as a rule  cut back the sugar and carbs   ? why he gained the weight  is exercising -- on the elliptical machine 20 minutes on work days  no weight training  more salads  ? if possibly eats too much   AIC 6.1 fairly stable with gluc 103  Allergies (verified): No Known Drug Allergies  Past History:  Past Surgical History: Last updated: 08/14/2007 Sleep study- mild apnea (11/2004) Baker's cyst- aspirated (03/2005)  Family History: Last updated: 08/14/2007 Father: ETOH Mother: heart problems, thyroid problems, HTN, brain tumor- cancer, died Siblings: none  Social History: Last updated: 05/04/2008 Marital Status:single Children: 2 Occupation: [employer] non smoker  Risk Factors: Smoking Status: current (08/14/2007)  Past Medical History: Allergic rhinitis Hypertension hyperlipidemia hyperglycemia  Review of Systems General:  Denies fatigue, loss of appetite, and malaise. Eyes:  Denies blurring and eye irritation. CV:  Denies chest pain or discomfort, lightheadness, palpitations, and shortness of breath with exertion. Resp:  Denies cough and wheezing. GI:  Denies abdominal pain, change in bowel habits, and indigestion. GU:  Denies dysuria and urinary frequency. MS:  Denies  joint pain, joint redness, and joint swelling. Derm:  Denies itching, lesion(s), poor wound healing, and rash. Neuro:  Denies numbness and tingling. Psych:  Denies anxiety and depression. Endo:  Denies excessive thirst and excessive urination. Heme:  Denies abnormal bruising and bleeding.  Physical Exam  General:  overweight but generally well appearing -- wt gain noted  Head:  normocephalic, atraumatic, and no abnormalities observed.   Eyes:  vision grossly intact, pupils equal, pupils round, and pupils reactive to light.   Mouth:  pharynx pink and moist.   Neck:  supple with full rom and no masses or thyromegally, no JVD or carotid bruit  Lungs:  Normal respiratory effort, chest expands symmetrically. Lungs are clear to auscultation, no crackles or wheezes. Heart:  Normal rate and regular rhythm. S1 and S2 normal without gallop, murmur, click, rub or other extra sounds. Abdomen:  no renal bruits  soft and nt  Msk:  No deformity or scoliosis noted of thoracic or lumbar spine.   Pulses:  R and L carotid,radial,femoral,dorsalis pedis and posterior tibial pulses are full and equal bilaterally Extremities:  No clubbing, cyanosis, edema, or deformity noted with normal full range of motion of all joints.   Neurologic:  sensation intact to light touch, gait normal, and DTRs symmetrical and normal.   Skin:  Intact without suspicious lesions or rashes Cervical Nodes:  No lymphadenopathy noted  Psych:  normal affect, talkative and pleasant    Impression & Recommendations:  Problem # 1:  HYPERGLYCEMIA (ICD-790.29) Assessment Unchanged  this is fairly stable with AIC 6.1 disc healthy diet (low simple sugar/ choose complex carbs/ low sat fat) diet and exercise in detail  may need to cut portion sizes commended on exercise  urged to cut out sweet tea with sugar  ref to nutritionist if ins covers  lab and f/u in 3 mo  Orders: Nutrition Referral (Nutrition)  Labs Reviewed: Creat: 1.4  (03/07/2010)     Problem # 2:  HYPERTENSION (ICD-401.9)  this is imp with compliant use of med rev labs  commended on exercise lab and f/u 3 mo  His updated medication list for this problem includes:    Triamterene-hctz 37.5-25 Mg Tabs (Triamterene-hctz) .Marland Kitchen... Take one by mouth once daily  Orders: Nutrition Referral (Nutrition)  BP today: 128/92 Prior BP: 124/100 (08/12/2009)  Labs Reviewed: K+: 4.0 (03/07/2010) Creat: : 1.4 (03/07/2010)   Chol: 202 (03/07/2010)   HDL: 35.00 (03/07/2010)   LDL: 107 (08/05/2009)   TG: 534.0 (03/07/2010)  Problem # 3:  HYPERCHOLESTEROLEMIA (ICD-272.0) Assessment: Deteriorated  for some reason- trig are up again- despite compliance with med- per pt (puzzling)  disc diet in detail and need to watch all fats  ref to nutritionist  lab and f/u in 3 mo  med refilled for mail order His updated medication list for this problem includes:    Lofibra 134 Mg Caps (Fenofibrate micronized) .Marland Kitchen... 1 by mouth once daily  Orders: Nutrition Referral (Nutrition)  Labs Reviewed: SGOT: 23 (03/07/2010)   SGPT: 31 (03/07/2010)   HDL:35.00 (03/07/2010), 34.00 (08/05/2009)  LDL:107 (08/05/2009), 122 (10/05/2008)  Chol:202 (03/07/2010), 177 (08/05/2009)  Trig:534.0 (03/07/2010), 181.0 (08/05/2009)  Complete Medication List: 1)  Triamterene-hctz 37.5-25 Mg Tabs (Triamterene-hctz) .... Take one by mouth once daily 2)  Aspirin Coated 81mg   .... 1 by mouth once daily 3)  Lofibra 134 Mg Caps (Fenofibrate micronized) .Marland Kitchen.. 1 by mouth once daily 4)  Multivitamins Tabs (Multiple vitamin) .... One daily 5)  Zyrtec Allergy 10 Mg Tabs (Cetirizine hcl) .... Otc as directed.  Patient Instructions: 1)  cut portions of food by 1/3  2)  continue exercise 3)  avoid fatty foods/ avoid sweets  4)  no change in medicines 5)  we will do a nutritional consult at check out  6)  schedule fasting labs in 3 months lipid/ast/alt/renal/ AIC for 272 and 401.1 and hyperglycemia    Prescriptions: LOFIBRA 134 MG CAPS (FENOFIBRATE MICRONIZED) 1 by mouth once daily  #90 x 3   Entered and Authorized by:   Judith Part MD   Signed by:   Judith Part MD on 03/14/2010   Method used:   Print then Give to Patient   RxID:   [id number]   Current Allergies (reviewed today): No known allergies

## 2010-10-03 NOTE — Letter (Signed)
Summary: [facility]  [facility]/[place]   Imported By: Maryln Gottron 03/23/2010 15:57:01  _____________________________________________________________________  External Attachment:    Type:   Image     Comment:   External Document

## 2010-10-03 NOTE — Assessment & Plan Note (Signed)
Summary: 6 m f/u  dlo   Vital Signs:  Patient Profile:   45 Years Old Male Weight:      236 pounds Temp:     97.6 degrees F oral Pulse rate:   72 / minute Pulse rhythm:   regular BP sitting:   128 / 80  (right arm) Cuff size:   large  Vitals Entered By: Liane Comber (May 04, 2008 9:23 AM)                 Chief Complaint:  6 month f/u.  History of Present Illness: is loosing weight with much better diet is active and cutting grass - and job has become a little more active  bp is better today  does not check outside the office   is generally feeling good -- ? getting a little cold or allergies runny/stuffy nose  eyes are not bothering him had mild ha 2 days   no change in medicines chol was imp last check in march expect it to be further improved today avoids eggs and fried foods  eats steak occas-- but more fish , and avoiding sweets  on tricor -- no side eff or problems       Current Allergies (reviewed today): No known allergies   Past Medical History:    Reviewed history from 11/17/2007 and no changes required:       Allergic rhinitis       Hypertension       hyperlipidemia  Past Surgical History:    Reviewed history from 08/14/2007 and no changes required:       Sleep study- mild apnea (11/2004)       Baker's cyst- aspirated (03/2005)   Family History:    Reviewed history from 08/14/2007 and no changes required:       Father: ETOH       Mother: heart problems, thyroid problems, HTN, brain tumor- cancer, died       Siblings: none  Social History:    Reviewed history from 08/14/2007 and no changes required:       Marital Status:single       Children: 2       Occupation: [employer]       non smoker    Review of Systems  General      Denies fatigue, fever, loss of appetite, and malaise.  Eyes      Denies blurring, eye irritation, and eye pain.  ENT      Complains of postnasal drainage.      Denies sinus pressure and sore  throat.  CV      Denies chest pain or discomfort, lightheadness, palpitations, shortness of breath with exertion, and swelling of feet.  Resp      Denies cough and wheezing.  GI      Denies abdominal pain and change in bowel habits.  MS      L knee is occ sore with stairs  Derm      Complains of lesion(s).      Denies rash.      has a fatty mole R hip-- no pain  Neuro      Denies numbness and tingling.  Psych      mood is good   Endo      Denies excessive thirst and excessive urination.   Physical Exam  General:     Well-developed,well-nourished,in no acute distress; alert,appropriate and cooperative throughout examination Head:     normocephalic,  atraumatic, and no abnormalities observed.   Eyes:     vision grossly intact, pupils equal, pupils round, and pupils reactive to light.   Ears:     R ear normal and L ear normal.   Nose:     nares are boggy and slt congested  Mouth:     pharynx pink and moist, no erythema, and no exudates.   Neck:     No deformities, masses, or tenderness noted. Lungs:     Normal respiratory effort, chest expands symmetrically. Lungs are clear to auscultation, no crackles or wheezes. Heart:     Normal rate and regular rhythm. S1 and S2 normal without gallop, murmur, click, rub or other extra sounds. Abdomen:     soft and non-tender.  no renal bruits  Msk:     No deformity or scoliosis noted of thoracic or lumbar spine.   Pulses:     R and L carotid,radial,femoral,dorsalis pedis and posterior tibial pulses are full and equal bilaterally Extremities:     No clubbing, cyanosis, edema, or deformity noted with normal full range of motion of all joints.   Neurologic:     sensation intact to light touch, gait normal, and DTRs symmetrical and normal.   Skin:     Intact without suspicious lesions or rashes Cervical Nodes:     No lymphadenopathy noted Psych:     normal affect, talkative and pleasant     Impression &  Recommendations:  Problem # 1:  HYPERTENSION (ICD-401.9) Assessment: Improved bp improved today with better diet/exercise and wt loss urged to keep up good effort and no change in triamterine/hct His updated medication list for this problem includes:    Triamterene-hctz 37.5-25 Mg Tabs (Triamterene-hctz) .Marland Kitchen... Take one by mouth qd  Orders: Venipuncture (04540) TLB-Lipid Panel (80061-LIPID) TLB-ALT (SGPT) (84460-ALT) TLB-AST (SGOT) (84450-SGOT)  BP today: 128/80 Prior BP: 146/94 (11/17/2007)  Labs Reviewed: Creat: 1.2 (08/15/2007) Chol: 177 (11/17/2007)   HDL: 43.0 (11/17/2007)   LDL: 116 (11/17/2007)   TG: 88 (11/17/2007)   Problem # 2:  HYPERCHOLESTEROLEMIA (ICD-272.0) Assessment: Unchanged with tricor and recently imp diet labs today-- exp improvement will decide f/u appt based on labs urged to keep up good habits  His updated medication list for this problem includes:    Tricor 48 Mg Tabs (Fenofibrate) .Marland Kitchen... Take one by mouth qd  Orders: Venipuncture (98119) TLB-Lipid Panel (80061-LIPID) TLB-ALT (SGPT) (84460-ALT) TLB-AST (SGOT) (84450-SGOT)  Labs Reviewed: Chol: 177 (11/17/2007)   HDL: 43.0 (11/17/2007)   LDL: 116 (11/17/2007)   TG: 88 (11/17/2007) SGOT: 21 (11/17/2007)   SGPT: 26 (11/17/2007)   Problem # 3:  ALLERGIC RHINITIS (ICD-477.9) Assessment: Deteriorated will start using allegra for the season His updated medication list for this problem includes:    Allegra 180 Mg Tabs (Fexofenadine hcl) .Marland Kitchen... Take one by mouth qd   Complete Medication List: 1)  Triamterene-hctz 37.5-25 Mg Tabs (Triamterene-hctz) .... Take one by mouth qd 2)  Tricor 48 Mg Tabs (Fenofibrate) .... Take one by mouth qd 3)  Allegra 180 Mg Tabs (Fexofenadine hcl) .... Take one by mouth qd   Patient Instructions: 1)  keep up the good job with diet and exercise and weight loss 2)  we are checking cholesterol today and will update you  3)  no change in medicines    ]

## 2010-10-03 NOTE — Assessment & Plan Note (Signed)
Summary: 3 M F/U  DLO   Vital Signs:  Patient Profile:   45 Years Old Male Weight:      243 pounds Temp:     99.5 degrees F oral Pulse rate:   84 / minute Pulse rhythm:   regular BP sitting:   146 / 94  (left arm) Cuff size:   large  Vitals Entered By: Lowella Petties (November 17, 2007 9:21 AM)                 Chief Complaint:  3 month follow up.  History of Present Illness: wt is down 5 lbs- has really been working on it  by his scale lost 8lb-  has been eating right   eyes have been tearing - stuck together in ams some runny nose/ stopped up no cough - thinks he may have allegies ? if allegra works very well anymore   last labs trig 367 HDL 27, and LDL 110   checked bp- 120/85 with PE at home for life insurance - but it is higher here today he wants to work harder on exercise before increasing or adding medication for blood pressure     Current Allergies: No known allergies   Past Medical History:    Allergic rhinitis    Hypertension    hyperlipidemia     Review of Systems  General      Denies chills, fatigue, and fever.  Eyes      Complains of discharge and eye irritation.      Denies eye pain.  ENT      Complains of nasal congestion and postnasal drainage.      Denies sore throat.  CV      Denies chest pain or discomfort, palpitations, and swelling of feet.  Resp      Denies cough and shortness of breath.  GU      Denies urinary frequency.  Derm      Denies rash.  Neuro      Denies numbness.  Psych      mood is ok  Endo      Denies excessive thirst and excessive urination.   Physical Exam  General:     Well-developed,well-nourished,in no acute distress; alert,appropriate and cooperative throughout examination Head:     normocephalic, atraumatic, and no abnormalities observed.  no sinus tenderness Eyes:     vision grossly intact, pupils equal, pupils round, pupils reactive to light, and no injection.   Ears:     R ear  normal and L ear normal.   Nose:     nasal dischargemucosal pallor and mucosal edema.  limited airflow in nares  Mouth:     pharynx pink and moist, no erythema, no exudates, and postnasal drip.   Neck:     supple with full rom and no masses or thyromegally, no JVD or carotid bruit  Lungs:     Normal respiratory effort, chest expands symmetrically. Lungs are clear to auscultation, no crackles or wheezes. Heart:     Normal rate and regular rhythm. S1 and S2 normal without gallop, murmur, click, rub or other extra sounds. Pulses:     R and L carotid,radial,femoral,dorsalis pedis and posterior tibial pulses are full and equal bilaterally Extremities:     No clubbing, cyanosis, edema, or deformity noted with normal full range of motion of all joints.   Neurologic:     sensation intact to light touch, gait normal, and DTRs symmetrical and normal.   Skin:  Intact without suspicious lesions or rashes- no urticatria or eczema Cervical Nodes:     No lymphadenopathy noted Psych:     normal affect, talkative and pleasant     Impression & Recommendations:  Problem # 1:  HYPERTENSION (ICD-401.9) Assessment: Deteriorated bp still not optimal- but pt states it was fine with life ins check at home ? if some white coat component will continue current med and really work on exercise f/u 6 mo- if not imp- may need to add tx His updated medication list for this problem includes:    Triamterene-hctz 37.5-25 Mg Tabs (Triamterene-hctz) .Marland Kitchen... Take one by mouth qd  BP today: 146/94 Prior BP: 130/92 (08/15/2007)  Labs Reviewed: Creat: 1.2 (08/15/2007) Chol: 204 (08/15/2007)   HDL: 27.0 (08/15/2007)   LDL: DEL (08/15/2007)   TG: 367 (08/15/2007)   Problem # 2:  HYPERCHOLESTEROLEMIA (ICD-272.0) Assessment: Unchanged expect imp with better diet  His updated medication list for this problem includes:    Tricor 48 Mg Tabs (Fenofibrate) .Marland Kitchen... Take one by mouth qd  Orders: Venipuncture  (16109) TLB-Lipid Panel (80061-LIPID) TLB-ALT (SGPT) (84460-ALT) TLB-AST (SGOT) (84450-SGOT)  Labs Reviewed: Chol: 204 (08/15/2007)   HDL: 27.0 (08/15/2007)   LDL: DEL (08/15/2007)   TG: 367 (08/15/2007) SGOT: 21 (08/15/2007)   SGPT: 28 (08/15/2007)   Problem # 3:  ALLERGIC RHINITIS (ICD-477.9) Assessment: Deteriorated worse in spring- also poss getting over uri will try claritin or zyrtec otc as alt to allegra and report back if still congested will add nasal steroid spray His updated medication list for this problem includes:    Allegra 180 Mg Tabs (Fexofenadine hcl) .Marland Kitchen... Take one by mouth qd   Complete Medication List: 1)  Triamterene-hctz 37.5-25 Mg Tabs (Triamterene-hctz) .... Take one by mouth qd 2)  Tricor 48 Mg Tabs (Fenofibrate) .... Take one by mouth qd 3)  Allegra 180 Mg Tabs (Fexofenadine hcl) .... Take one by mouth qd   Patient Instructions: 1)  you can raise your HDL (good cholesterol) by increasing exercise and eating omega 3 fatty acid supplement like fish oil or flax seed oil over the counter 2)  you can lower LDL (bad cholesterol) by limiting saturated fats in diet like red meat, fried foods, egg yolks, fatty breakfast meats, high fat dairy products and shellfish 3)  start some regular exercise- this should help your good cholesterol (HDL)- and also help with blood pressure and weight- optimally 20 or more minutes of cardio 5 days week 4)  keep working on healthy diet 5)  for allergies- try claritin or zyrtec over the counter (each are 10 mg daily)- to see if either of these work better than allegra 6)  if you get increased cough or fever- let me know 7)  if your congestion does not improve- call - and I will px a nasal spray as add on therapy  8)  follow up in 6 months - hopefully blood pressure will be improved    ]

## 2010-10-03 NOTE — Letter (Signed)
Summary: [place] No Show Letter  [place] [facility]  [address] [place], [place] [address]   Phone: [phone]  Fax: [phone]    01/10/2007   Dear Charles Watkins,   Our records indicate that you missed your scheduled appointment with __the Laboratory_________________ on ___4-8-2008_________.  Please contact this office to reschedule your appointment as soon as possible.  It is important that you keep your scheduled appointments with your physician, so we can provide you the best care possible.  Please be advised that there may be a charge for "no show" appointments.    Sincerely,   [place] [facility]

## 2010-10-03 NOTE — Assessment & Plan Note (Signed)
Summary: TO DISCUSS LABS  CYD   Vital Signs:  Patient profile:   45 year old male Height:      72 inches Weight:      273.75 pounds BMI:     37.26 Temp:     97.9 degrees F oral Pulse rate:   80 / minute Pulse rhythm:   regular BP sitting:   134 / 92  (left arm) Cuff size:   large  Vitals Entered By: Lewanda Rife LPN (July 06, 2010 9:27 AM)  Serial Vital Signs/Assessments:  Time      Position  BP       Pulse  Resp  Temp     By                     128/80                         Judith Part MD  CC: to discuss labs   History of Present Illness: here for f/u of HTN and hyperglycemia and chol  wt is up 2 lb  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 and generally not eating as much these days  more high fiber foods  staying away from sugars and fats   some exercise- not enough  doing wome walking and cardio    AIC is 6.3- up from 6.1  nl renal and hepatic  lipids -- in lab error were not done and need to be ordered   bp 134/92 first check-- better on second check  little cough and congestion- no fever and does not feel sick  needs flu shot   Allergies (verified): No Known Drug Allergies  Past History:  Past Medical History: Last updated: 03/14/2010 Allergic rhinitis Hypertension hyperlipidemia hyperglycemia  Past Surgical History: Last updated: 08/14/2007 Sleep study- mild apnea (11/2004) Baker's cyst- aspirated (03/2005)  Family History: Last updated: 08/14/2007 Father: ETOH Mother: heart problems, thyroid problems, HTN, brain tumor- cancer, died Siblings: none  Social History: Last updated: 05/04/2008 Marital Status:single Children: 2 Occupation: [employer] non smoker  Risk Factors: Smoking Status: current (08/14/2007)  Review of Systems General:  Denies fatigue, loss of appetite, and malaise. Eyes:  Denies blurring and eye irritation. CV:  Denies chest pain or discomfort, fatigue, shortness of breath with exertion, and swelling of  feet. Resp:  Denies cough, shortness of breath, and wheezing. GI:  Denies abdominal pain, bloody stools, change in bowel habits, indigestion, and nausea. GU:  Denies urinary frequency. MS:  Complains of joint pain; denies joint redness and muscle aches. Derm:  Denies itching, lesion(s), poor wound healing, and rash. Neuro:  Denies numbness and tingling. Psych:  Denies anxiety and depression. Endo:  Denies cold intolerance, excessive thirst, excessive urination, and heat intolerance. Heme:  Denies abnormal bruising and bleeding.  Physical Exam  General:  overweight but generally well appearing  Head:  normocephalic, atraumatic, and no abnormalities observed.   Eyes:  vision grossly intact, pupils equal, pupils round, and pupils reactive to light.  no conjunctival pallor, injection or icterus  Ears:  R ear normal and L ear normal.   Neck:  supple with full rom and no masses or thyromegally, no JVD or carotid bruit  Lungs:  Normal respiratory effort, chest expands symmetrically. Lungs are clear to auscultation, no crackles or wheezes. Heart:  Normal rate and regular rhythm. S1 and S2 normal without gallop, murmur, click, rub or other extra sounds.  Abdomen:  no renal bruits  soft, non-tender, and normal bowel sounds.   Msk:  No deformity or scoliosis noted of thoracic or lumbar spine.   Pulses:  R and L carotid,radial,femoral,dorsalis pedis and posterior tibial pulses are full and equal bilaterally Extremities:  L knee hurts on full flex no crepatice or instability non swollen  Neurologic:  sensation intact to light touch, gait normal, and DTRs symmetrical and normal.   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 Take one by mouth once daily  BP today: 134/92 Prior BP: 128/92 (03/14/2010)  Labs Reviewed: K+: 3.8 (06/19/2010) Creat: : 1.3 (06/19/2010)   Chol: 202 (03/07/2010)   HDL: 35.00 (03/07/2010)   LDL: 107 (08/05/2009)   TG: 534.0 (03/07/2010)  Problem # 3:  HYPERCHOLESTEROLEMIA (ICD-272.0) Assessment: Unchanged  check today with better diet  was omitted from last labs - lab error / apology made  His updated medication list for this problem includes:    Lofibra 134 Mg Caps (Fenofibrate micronized) .Marland Kitchen... 1 by mouth once daily  Orders: Venipuncture (16109) TLB-Lipid Panel (80061-LIPID)  Labs Reviewed: SGOT: 21 (06/19/2010)   SGPT: 26 (06/19/2010)   HDL:35.00 (03/07/2010), 34.00 (08/05/2009)  LDL:107 (08/05/2009), 122 (10/05/2008)  Chol:202 (03/07/2010), 177 (08/05/2009)  Trig:534.0 (03/07/2010), 181.0 (08/05/2009)  Problem # 4:  KNEE PAIN, LEFT (ICD-719.46) bothersome at work and with exercise no swelling  ref to sport med for eval  Complete Medication List: 1)  Triamterene-hctz 37.5-25 Mg Tabs (Triamterene-hctz) .... Take one by mouth once daily 2)  Aspirin Coated 81mg   .... 1 by mouth once daily 3)  Lofibra 134 Mg Caps (Fenofibrate micronized) .Marland Kitchen.. 1 by mouth once daily 4)  Multivitamins Tabs (Multiple vitamin) .... One daily 5)  Zyrtec Allergy 10 Mg Tabs (Cetirizine hcl) .... Otc as directed.  Other Orders: Admin 1st Vaccine (60454) Flu Vaccine 64yrs + [phone])  Patient Instructions: 1)  please make appt with Dr Patsy Lager after jan 1 for knee pain eval 2)  flu shot  today 3)  stick with diabetic diet/ smaller portions  4)  try to aim for 5 days of exercise per week eventually  5)  avoid sugars and fats in diet  6)  lab today for cholesterol  7)  update me if your cold or allergies get worse -- continue zyrtec  8)  schedule fasting labs in 6 months lipid/ast/alt/ AIC / renal for hyperglycemia and 272  and then follow up  Prescriptions: LOFIBRA 134 MG CAPS (FENOFIBRATE MICRONIZED) 1 by mouth once daily  #90 x 3   Entered and Authorized by:   Judith Part MD   Signed by:   Judith Part MD on 07/06/2010   Method used:   Print then Give to Patient   RxID:   [id number] TRIAMTERENE-HCTZ 37.5-25 MG  TABS (TRIAMTERENE-HCTZ) take one by mouth once daily  #90 x 3   Entered and Authorized by:   Judith Part MD   Signed by:   Judith Part MD on 07/06/2010   Method used:   Print then Give to Patient   RxID:   [id number]    Orders Added: 1)  Venipuncture [08657] 2)  TLB-Lipid Panel [80061-LIPID] 3)  Admin 1st Vaccine [90471] 4)  Flu Vaccine 47yrs + [84696] 5)  Est. Patient Level IV [29528]    Current Allergies (reviewed today): No known allergies Flu Vaccine Consent Questions     Do you have a history of severe allergic reactions to this vaccine? no    Any prior history of allergic reactions to egg and/or gelatin? no    Do you have a sensitivity to the preservative Thimersol? no    Do you have a past history of Guillan-Barre Syndrome? no    Do you currently have an acute febrile illness? no    Have you ever had a severe reaction to latex? no    Vaccine information given and explained to patient? yes    Are you currently pregnant? no    Lot Number:[id number]   Exp Date:03/03/2011   Site Given  Left Deltoid IM Lewanda Rife LPN  July 06, 2010 3:58 PM .lbflu1

## 2010-10-05 NOTE — Assessment & Plan Note (Signed)
Summary: KNEE PAIN EVAL/DLO   Vital Signs:  Patient profile:   45 year old male Height:      72 inches Weight:      273.75 pounds BMI:     37.26 Temp:     98.6 degrees F oral Pulse rate:   80 / minute Pulse rhythm:   regular BP sitting:   140 / 88  (left arm) Cuff size:   large  Vitals Entered By: Benny Lennert CMA Duncan Dull) (September 06, 2010 12:13 PM)  History of Present Illness: 45 year old male: Left knee pain  6 months  Just the left.   [employer] - mostly light, occ sometimes heavy.   Had a cyst in the back of right knee a few years ago - had arthroscopy  Patient presents with 6 mo h/o L sided knee pain after no specific injry.. No audible pop was heard. The patient has not had an effusion. Rare symptomatic giving-way. No mechanical clicking. Joint has not locked up. Patient has been able to walk but rare limping. The patient does have pain going up and down stairs or rising from a seated position.   Pain location: interior and anterior Current physical activity: active at work Prior Knee Surgery: R arthroscopy Current pain meds: none Bracing: none Occupation or school level: works at [employer]     Allergies: No Known Drug Allergies  Past History:  Past medical, surgical, family and social histories (including risk factors) reviewed, and no changes noted (except as noted below).  Past Medical History: Reviewed history from 03/14/2010 and no changes required. Allergic rhinitis Hypertension hyperlipidemia hyperglycemia  Past Surgical History: Reviewed history from 08/14/2007 and no changes required. Sleep study- mild apnea (11/2004) Baker's cyst- aspirated (03/2005)  Family History: Reviewed history from 08/14/2007 and no changes required. Father: ETOH Mother: heart problems, thyroid problems, HTN, brain tumor- cancer, died Siblings: none  Social History: Reviewed history from 05/04/2008 and no changes required. 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 Otherwise the pertinent positives of the ROS are noted above.    Physical Exam  General:  GEN: Well-developed,well-nourished,in no acute distress; alert,appropriate and cooperative throughout examination HEENT: Normocephalic and atraumatic without obvious abnormalities. No apparent alopecia or balding. Ears, externally no deformities PULM: Breathing comfortably in no respiratory distress EXT: No clubbing, cyanosis, or edema PSYCH: Normally interactive. Cooperative during the interview. Pleasant. Friendly and conversant. Not anxious or depressed appearing. Normal, full affect.  Msk:  Gait: Normal heel toe pattern ROM: WNL Effusion: neg Echymosis or edema: none Patellar tendon NT Painful PLICA: neg Patellar grind: negative Medial and lateral patellar facet loading: negative medial and lateral joint lines:NT Mcmurray's neg Flexion-pinch some pain, but not true positive Varus and valgus stress: stable Lachman: neg Ant and Post drawer: neg Hip abduction, IR, ER: WNL Hip flexion str: 5/5 Hip abd: 5/5 Quad: 5/5 VMO atrophy:No Hamstring concentric and eccentric: 5/5    Impression & Recommendations:  Problem # 1:  KNEE PAIN, LEFT (ICD-719.46) Assessment New synovitis knee, cannot rule out small meniscal tear. Would expect he will do well conservatively. Some PF irritation.  Voltaren two times a day   Knee Injection, LEFT Patient verbally consented to procedure. Risks, benefits,  and alternatives explained. Sterilely prepped with betadine. Ethyl cholride used for anesthesia. 9 cc Lidocaine 1% mixed with 1 cc of Kenalog 40 mg injected using the anterolateral approach without difficulty. No  complications with procedure and tolerated well. Patient had decreased pain post-injection.   X-rays: AP Bilateral Weight-bearing, Weightbearing Lateral, Sunrise views Indication: knee pain Findings:  effectively no OA on exam or film  His updated medication list for this problem includes:    Diclofenac Sodium 75 Mg Tbec (Diclofenac sodium) .Marland Kitchen... 1 by mouth bid. take with food  Orders: T-Knee Left 2 view (73560TC) T-DG Knee Bilateral Standing AP (16109) Joint Aspirate / Injection, Large (20610) Kenalog 10mg  (4units) (J3301)  Complete Medication List: 1)  Triamterene-hctz 37.5-25 Mg Tabs (Triamterene-hctz) .... Take one by mouth once daily 2)  Aspirin Coated 81mg   .... 1 by mouth once daily 3)  Lofibra 134 Mg Caps (Fenofibrate micronized) .Marland Kitchen.. 1 by mouth once daily 4)  Multivitamins Tabs (Multiple vitamin) .... One daily 5)  Zyrtec Allergy 10 Mg Tabs (Cetirizine hcl) .... Otc as directed. 6)  Diclofenac Sodium 75 Mg Tbec (Diclofenac sodium) .Marland Kitchen.. 1 by mouth bid. take with food  Patient Instructions: 1)  After you have an injection of any joint, the numbing medicine will make it feel better for a few hours. Later tonight, it is common for the joint to feel worse. The steroid will take 48-72 hours to start working - it is the thing that will likely provide the most relief. 2)  Ice the joint where you had the injection at least 2-3 times a day for 20 minutes for 3 days. If have had swelling, pain in the joint itself, ice for 1 week. 3)  You can use an ice bag, frozen peas or corn, an ice pack - all work  Prescriptions: DICLOFENAC SODIUM 75 MG TBEC (DICLOFENAC SODIUM) 1 by mouth bid. take with food  #60 x 1   Entered and Authorized by:   Hannah Beat MD   Signed by:   Hannah Beat MD on 09/06/2010   Method used:   Electronically to        [facility]  [address] [phone]* (retail)       [address]       [place], [place]  [address]       Ph: [phone] or [phone]       Fax:  [phone]   RxID:   [id number]    Orders Added: 1)  T-Knee Left 2 view [73560TC] 2)  T-DG Knee Bilateral Standing AP [73565] 3)  Est. Patient Level IV [03474] 4)  Joint Aspirate / Injection, Large [20610] 5)  Kenalog 10mg  (4units) [J3301]

## 2010-10-25 ENCOUNTER — Encounter: Payer: Self-pay | Admitting: Family Medicine

## 2010-12-04 ENCOUNTER — Other Ambulatory Visit: Payer: Self-pay | Admitting: Family Medicine

## 2010-12-04 DIAGNOSIS — E7800 Pure hypercholesterolemia, unspecified: Secondary | ICD-10-CM

## 2010-12-05 ENCOUNTER — Other Ambulatory Visit: Payer: Self-pay

## 2010-12-12 ENCOUNTER — Ambulatory Visit: Payer: Self-pay | Admitting: Family Medicine

## 2010-12-15 ENCOUNTER — Ambulatory Visit: Payer: Self-pay | Admitting: Family Medicine

## 2010-12-18 ENCOUNTER — Ambulatory Visit: Payer: Self-pay | Admitting: Family Medicine

## 2010-12-25 ENCOUNTER — Encounter: Payer: Self-pay | Admitting: Family Medicine

## 2010-12-25 ENCOUNTER — Ambulatory Visit (INDEPENDENT_AMBULATORY_CARE_PROVIDER_SITE_OTHER): Payer: BC Managed Care – PPO | Admitting: Family Medicine

## 2010-12-25 DIAGNOSIS — I10 Essential (primary) hypertension: Secondary | ICD-10-CM

## 2010-12-25 DIAGNOSIS — M25569 Pain in unspecified knee: Secondary | ICD-10-CM

## 2010-12-25 DIAGNOSIS — R7309 Other abnormal glucose: Secondary | ICD-10-CM

## 2010-12-25 DIAGNOSIS — E7800 Pure hypercholesterolemia, unspecified: Secondary | ICD-10-CM

## 2010-12-25 DIAGNOSIS — G4730 Sleep apnea, unspecified: Secondary | ICD-10-CM

## 2010-12-25 DIAGNOSIS — T887XXA Unspecified adverse effect of drug or medicament, initial encounter: Secondary | ICD-10-CM

## 2010-12-25 LAB — RENAL FUNCTION PANEL
Albumin: 4 g/dL (ref 3.5–5.2)
BUN: 15 mg/dL (ref 6–23)
CO2: 29 mEq/L (ref 19–32)
Calcium: 9.2 mg/dL (ref 8.4–10.5)
Chloride: 97 mEq/L (ref 96–112)
Creatinine, Ser: 1.2 mg/dL (ref 0.4–1.5)
GFR: 83.08 mL/min (ref >60.00)
Glucose, Bld: 101 mg/dL — ABNORMAL HIGH (ref 70–99)
Phosphorus: 3.1 mg/dL (ref 2.3–4.6)
Potassium: 4.1 mEq/L (ref 3.5–5.1)
Sodium: 138 mEq/L (ref 135–145)

## 2010-12-25 LAB — HEMOGLOBIN A1C: Hgb A1c MFr Bld: 6.4 % (ref 4.6–6.5)

## 2010-12-25 NOTE — Assessment & Plan Note (Signed)
This is currently well controlled with maxzide Disc lifestyle changes  Renal panel today

## 2010-12-25 NOTE — Assessment & Plan Note (Signed)
Check this today as pt missed his lab appt  Last trig down but still not at goal Long talk about wt loss and low sat fat diet  Adv with results

## 2010-12-25 NOTE — Patient Instructions (Signed)
Labs today  We will decide follow up based on your labs We will schedule appt with Dr Patsy Lager in sports med - at check out  Keep working on weight loss- keep cutting portions  Goal is to work up to 30 minutes of exercise at least 5 days per week

## 2010-12-25 NOTE — Assessment & Plan Note (Addendum)
On off/ both knees/ worse L Hx of bakers cyst  Also wt gain Ref to sports med

## 2010-12-25 NOTE — Assessment & Plan Note (Signed)
Intermittent and keeping him from exercising  ? From bakers cyst or other No swelling  Ref to sports med

## 2010-12-25 NOTE — Assessment & Plan Note (Signed)
Doing well with low sugar diet - puzzled by wt gain a1c today and adv  Rev low glycemic diet again  Enc exercise when he can

## 2010-12-25 NOTE — Progress Notes (Signed)
Subjective:    Patient ID: Charles Watkins, male    DOB: 04-Dec-1965, 45 y.o.   MRN: [id number]  HPI Here for f/u of HTN and lipids and hyperglycemia  Is feeling really good - no new medical issues   Wt is up 9 lb with bmi of 38 Is trying to loose and having a really hard time  Is trying to "diet" -- no sweets at all  Eating smaller amounts more frequently-- thinks net intake is more  No fast or fried foods  Is "trying " to exercise-- some aches and pains-- R knee is hurts  The one with the baker's cyst is hurting him more  Not a lot of exercise now  Treadmill every night  Is interested in sports med consult   HTN in good control with 126/88 today  On maxzide for that  No headache/ cp or sob No swelling of legs or feet  Lab Results  Component Value Date   CREATININE 1.3 06/19/2010   BUN 18 06/19/2010   NA 140 06/19/2010   K 3.8 06/19/2010   CL 106 06/19/2010   CO2 27 06/19/2010    Lipids on lofibra Diet Lab Results  Component Value Date   CHOL 200 07/06/2010   CHOL 202* 03/07/2010   CHOL 177 08/05/2009   Lab Results  Component Value Date   HDL 38.00* 07/06/2010   HDL 35.00* 03/07/2010   HDL 40.98* 08/05/2009   Lab Results  Component Value Date   LDLCALC 107* 08/05/2009   LDLCALC 122* 10/05/2008   LDLCALC 129* 05/04/2008   Lab Results  Component Value Date   TRIG 289.0* 07/06/2010   TRIG 534.0* 03/07/2010   TRIG 181.0* 08/05/2009   Lab Results  Component Value Date   CHOLHDL 5 07/06/2010   CHOLHDL 6 03/07/2010   CHOLHDL 5 08/05/2009   improved trig but still high Diet is better now   Hyperglycemia Lab Results  Component Value Date   HGBA1C 6.3 06/19/2010  not eating sweets Smaller portions  Not too much fruit But did gain wt  Knows exercise would help  Is overall sleeping better  Past Medical History  Diagnosis Date  . Allergic rhinitis   . Hypertension   . Hyperlipidemia   . Hyperglycemia    Past Surgical History  Procedure Date  . Baker's  cyst - aspirated 03/2005    reports that he has been smoking Cigarettes.  He has been smoking about .1 packs per day. He does not have any smokeless tobacco history on file. His alcohol and drug histories not on file. family history includes Brain cancer in his mother; Heart disease in his mother; Hypertension in his mother; and Thyroid disease in his mother. No Known Allergies      Review of Systems    Review of Systems  Constitutional: Negative for fever, appetite change, fatigue and unexpected weight change.  Eyes: Negative for pain and visual disturbance.  Respiratory: Negative for cough and shortness of breath.   Cardiovascular: Negative.   Gastrointestinal: Negative for nausea, diarrhea and constipation.  Genitourinary: Negative for urgency and frequency.  Skin: Negative for pallor.  Neurological: Negative for weakness, light-headedness, numbness and headaches.  Hematological: Negative for adenopathy. Does not bruise/bleed easily.  Psychiatric/Behavioral: Negative for dysphoric mood. The patient is not nervous/anxious.      Objective:   Physical Exam  Constitutional: He appears well-developed and well-nourished.       overwt and well appearing   HENT:  Head:  Normocephalic and atraumatic.  Mouth/Throat: Oropharynx is clear and moist.       Mild baseline speech impediment   Eyes: EOM are normal. Pupils are equal, round, and reactive to light.  Neck: Normal range of motion. Neck supple. No JVD present. Carotid bruit is not present. No thyromegaly present.  Cardiovascular: Normal rate, regular rhythm and normal heart sounds.   Pulmonary/Chest: Effort normal and breath sounds normal. He has no wheezes. He has no rales.  Abdominal: Soft. Bowel sounds are normal. He exhibits no distension, no abdominal bruit and no mass. There is no tenderness.  Musculoskeletal: He exhibits tenderness. He exhibits no edema.       Limited rom L knee due to pain  Lymphadenopathy:    He has no  cervical adenopathy.  Neurological: He is alert. He has normal reflexes. Coordination normal.  Skin: Skin is warm and dry. No rash noted. No erythema. No pallor.  Psychiatric: He has a normal mood and affect.          Assessment & Plan:

## 2010-12-28 ENCOUNTER — Encounter: Payer: Self-pay | Admitting: Family Medicine

## 2010-12-28 ENCOUNTER — Ambulatory Visit (INDEPENDENT_AMBULATORY_CARE_PROVIDER_SITE_OTHER): Payer: BC Managed Care – PPO | Admitting: Family Medicine

## 2010-12-28 VITALS — BP 120/88 | HR 98 | Temp 98.5°F | Ht 74.0 in | Wt 278.8 lb

## 2010-12-28 DIAGNOSIS — M25569 Pain in unspecified knee: Secondary | ICD-10-CM

## 2010-12-28 MED ORDER — TRIAMCINOLONE ACETONIDE 40 MG/ML IJ SUSP: 40.0000 mg | Freq: Once | INTRAMUSCULAR | Status: DC

## 2010-12-28 MED ORDER — DICLOFENAC SODIUM 75 MG PO TBEC: 75.0000 mg | DELAYED_RELEASE_TABLET | Freq: Two times a day (BID) | ORAL | Status: AC

## 2010-12-28 NOTE — Progress Notes (Signed)
45 year old male:  Almost fell in the shower three or four weeks ago Pain for about a month, R knee, mostly lateral  Patient presents with 3-4 week h/o lateral sided knee pain after possible fall. He is not clear if this is associated with his knee pain. No audible pop was heard. The patient has had a mild effusion. No symptomatic giving-way. No mechanical clicking. Joint has not locked up. Patient has been able to walk. The patient does not have pain going up and down stairs or rising from a seated position.   Pain location: lateral Current physical activity: very active Prior Knee Surgery: popliteal cyst removal, several years ago Current pain meds: none Bracing: none Occupation or school level: [employer]  The PMH, PSH, Social History, Family History, Medications, and allergies have been reviewed in [facility], and have been updated if relevant.  GEN: Well-developed,well-nourished,in no acute distress; alert,appropriate and cooperative throughout examination HEENT: Normocephalic and atraumatic without obvious abnormalities. Ears, externally no deformities PULM: Breathing comfortably in no respiratory distress EXT: No clubbing, cyanosis, or edema PSYCH: Normally interactive. Cooperative during the interview. Pleasant. Friendly and conversant. Not anxious or depressed appearing. Normal, full affect.  Gait: Normal heel toe pattern ROM: WNL Effusion: mild Echymosis or edema: none Patellar tendon NT Painful PLICA: neg Patellar grind: negative Medial and lateral patellar facet loading: negative medial and lateral joint lines: TTP LATERAL JOINT line Mcmurray's POS Flexion-pinch POS Varus and valgus stress: stable Lachman: neg Ant and Post drawer: neg Hip abduction, IR, ER: WNL Hip flexion str: 5/5 Hip abd: 5/5 Quad: 5/5 VMO atrophy:No Hamstring concentric and eccentric: 5/5  A/P: Knee Pain, Internal derangement  R knee, lateral knee pain. Probable meniscal contusion vs. Small  tear.  Treat conservatively. NSAIDS, inject with Kenalog F/u 1 mo if still symptomatic  Knee Injection, Right Patient verbally consented to procedure. Risks, benefits, and alternatives explained. Sterilely prepped with betadine. Ethyl cholride used for anesthesia. 9 cc Lidocaine 1% mixed with 1 cc of Kenalog 40 mg injected using the anterolateral approach without difficulty. No complications with procedure and tolerated well. Patient had decreased pain post-injection.

## 2011-10-01 ENCOUNTER — Other Ambulatory Visit: Payer: Self-pay | Admitting: Family Medicine

## 2011-10-01 NOTE — Telephone Encounter (Signed)
[facility] request refill Lofibra 134 mg #90 x 1 and Triamterene HCTZ 37.5-25 mg #90 x 1.

## 2011-12-18 IMAGING — CR DG KNEE 1-2V*L*
2 series · 2 of 2 positions shown · non-contrast
Comparison: Same day

CLINICAL DATA: Pain

LEFT KNEE - 1-2 VIEW

[view not recorded (1 of 2)]
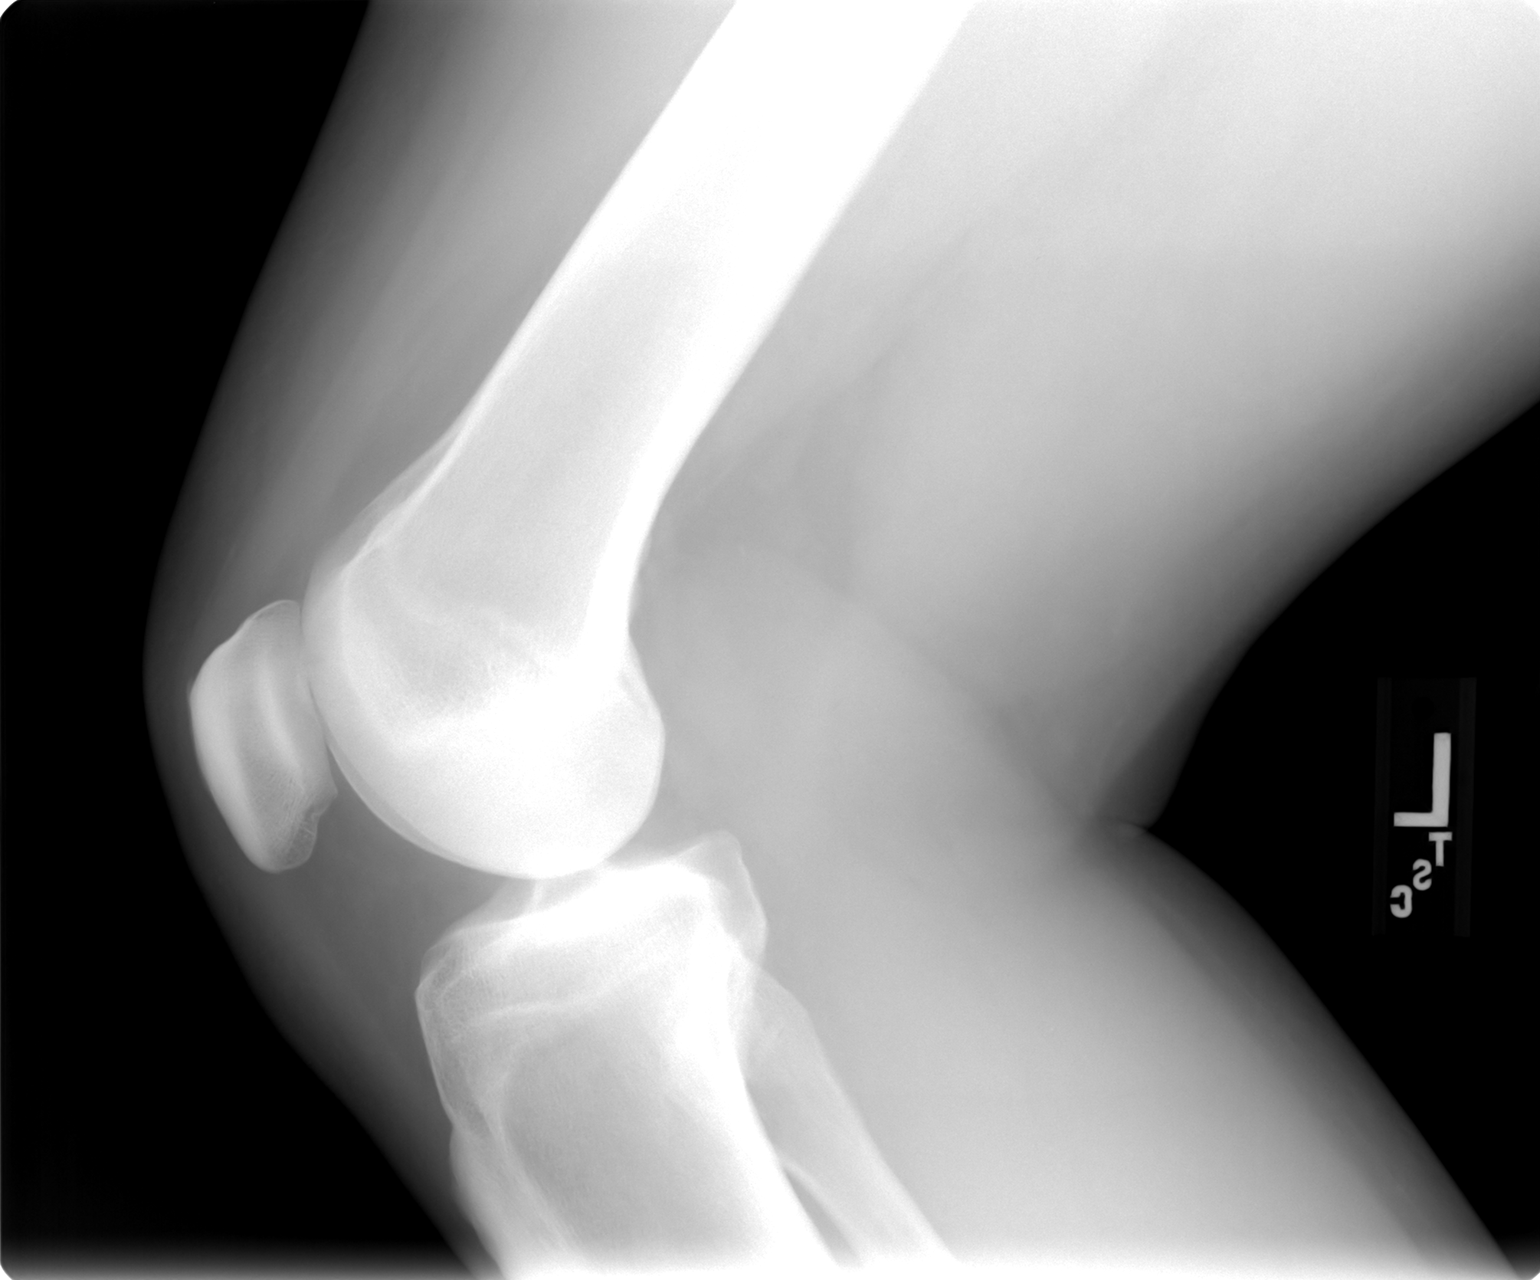

[view not recorded (2 of 2)]
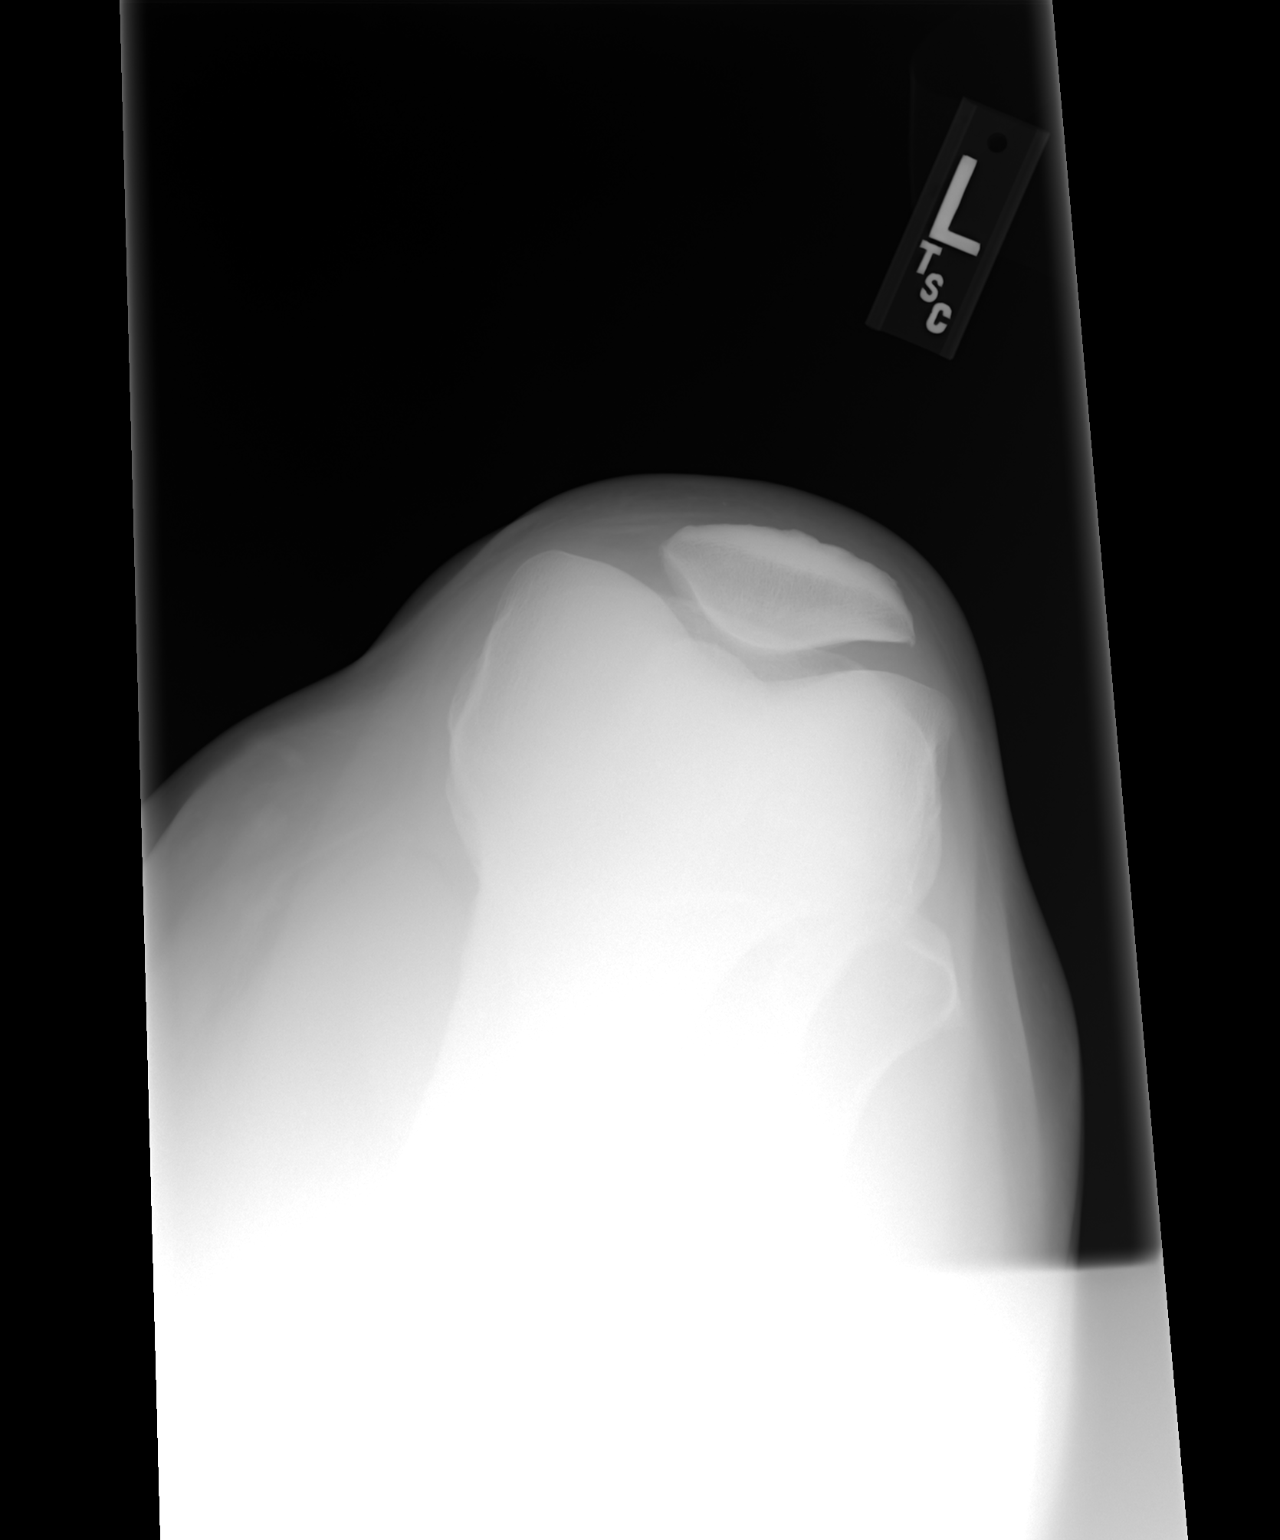

[2 of 2 positions shown; findings below may reference images not displayed]

FINDINGS: No joint effusion.  Findings suggest mild patellofemoral
osteoarthritis.  No other focal lesion.
IMPRESSION: Suspicion of  mild patellofemoral osteoarthritis.

## 2012-01-05 ENCOUNTER — Other Ambulatory Visit: Payer: Self-pay | Admitting: Family Medicine

## 2012-03-06 ENCOUNTER — Other Ambulatory Visit: Payer: Self-pay | Admitting: Family Medicine

## 2012-03-07 NOTE — Telephone Encounter (Signed)
[facility] request Edwina Barth; pt last seen 12/28/10, no appt scheduled. Last refilled 12/07/11. Please advise.

## 2012-03-07 NOTE — Telephone Encounter (Signed)
Appointment scheduled for patient

## 2012-03-07 NOTE — Telephone Encounter (Signed)
Please make appt  Short supply sent in electronically

## 2012-03-12 ENCOUNTER — Encounter: Payer: Self-pay | Admitting: Family Medicine

## 2012-03-12 ENCOUNTER — Ambulatory Visit (INDEPENDENT_AMBULATORY_CARE_PROVIDER_SITE_OTHER): Payer: BC Managed Care – PPO | Admitting: Family Medicine

## 2012-03-12 VITALS — BP 148/90 | HR 89 | Temp 98.2°F | Ht 74.0 in | Wt 293.8 lb

## 2012-03-12 DIAGNOSIS — I10 Essential (primary) hypertension: Secondary | ICD-10-CM

## 2012-03-12 DIAGNOSIS — E7800 Pure hypercholesterolemia, unspecified: Secondary | ICD-10-CM

## 2012-03-12 DIAGNOSIS — R7309 Other abnormal glucose: Secondary | ICD-10-CM

## 2012-03-12 LAB — COMPREHENSIVE METABOLIC PANEL
ALT: 31 U/L (ref 0–53)
AST: 29 U/L (ref 0–37)
Albumin: 4.3 g/dL (ref 3.5–5.2)
Alkaline Phosphatase: 45 U/L (ref 39–117)
BUN: 14 mg/dL (ref 6–23)
CO2: 27 mEq/L (ref 19–32)
Calcium: 9.6 mg/dL (ref 8.4–10.5)
Chloride: 102 mEq/L (ref 96–112)
Creatinine, Ser: 1.4 mg/dL (ref 0.4–1.5)
GFR: 73.51 mL/min (ref >60.00)
Glucose, Bld: 130 mg/dL — ABNORMAL HIGH (ref 70–99)
Potassium: 3.8 mEq/L (ref 3.5–5.1)
Sodium: 137 mEq/L (ref 135–145)
Total Bilirubin: 0.7 mg/dL (ref 0.3–1.2)
Total Protein: 8.3 g/dL (ref 6.0–8.3)

## 2012-03-12 LAB — CBC WITH DIFFERENTIAL/PLATELET
Basophils Absolute: 0 10*3/uL (ref 0.0–0.1)
Basophils Relative: 0.4 % (ref 0.0–3.0)
Eosinophils Absolute: 0.2 10*3/uL (ref 0.0–0.7)
Eosinophils Relative: 4.1 % (ref 0.0–5.0)
HCT: 42.3 % (ref 39.0–52.0)
Hemoglobin: 14.3 g/dL (ref 13.0–17.0)
Lymphocytes Relative: 51 % — ABNORMAL HIGH (ref 12.0–46.0)
Lymphs Abs: 2.3 10*3/uL (ref 0.7–4.0)
MCHC: 33.8 g/dL (ref 30.0–36.0)
MCV: 90.3 fl (ref 78.0–100.0)
Monocytes Absolute: 0.6 10*3/uL (ref 0.1–1.0)
Monocytes Relative: 13.1 % — ABNORMAL HIGH (ref 3.0–12.0)
Neutro Abs: 1.4 10*3/uL (ref 1.4–7.7)
Neutrophils Relative %: 31.4 % — ABNORMAL LOW (ref 43.0–77.0)
Platelets: 274 10*3/uL (ref 150.0–400.0)
RBC: 4.69 Mil/uL (ref 4.22–5.81)
RDW: 13.7 % (ref 11.5–14.6)
WBC: 4.4 10*3/uL — ABNORMAL LOW (ref 4.5–10.5)

## 2012-03-12 LAB — LIPID PANEL
Cholesterol: 237 mg/dL — ABNORMAL HIGH (ref 0–200)
HDL: 43 mg/dL (ref >39.00)
Total CHOL/HDL Ratio: 6
Triglycerides: 244 mg/dL — ABNORMAL HIGH (ref 0.0–149.0)
VLDL: 48.8 mg/dL — ABNORMAL HIGH (ref 0.0–40.0)

## 2012-03-12 LAB — HEMOGLOBIN A1C: Hgb A1c MFr Bld: 6.4 % (ref 4.6–6.5)

## 2012-03-12 LAB — LDL CHOLESTEROL, DIRECT: Direct LDL: 136.5 mg/dL

## 2012-03-12 LAB — TSH: TSH: 1.84 u[IU]/mL (ref 0.35–5.50)

## 2012-03-12 NOTE — Progress Notes (Signed)
Subjective:    Patient ID: Charles Watkins, male    DOB: 08-02-1966, 46 y.o.   MRN: [id number]  HPI Here for f/u of chronic problems   Is doing ok overall  Having some nasal allergies   bp is  up   Today BP Readings from Last 3 Encounters:  03/12/12 148/93  12/28/10 120/88  12/25/10 126/88  did take his medicine this am   No cp or palpitations or headaches or edema  No side effects to medicines   Is on triamterene hctz  Wt is up 15lb Diet - overall good until this month -- just got re married July 2nd - going well , got off track  Exercise - walks the dogs , but no longer going to gym -- is thinking about going back  bmi is 37  Knees are a lot better   Need to check cholesterol On lofibra  Trying away from fatty foods most of the time   Also has hyperglycemia  Is good about avoiding sugar , but some sweet tea   2-3 glasses per week  Patient Active Problem List  Diagnosis  . HYPERCHOLESTEROLEMIA  . HYPERTENSION  . ALLERGIC RHINITIS  . KNEE PAIN, LEFT  . BAKER'S CYST  . SLEEP APNEA  . HYPERGLYCEMIA  . ADVEF, DRUG/MEDICINAL/BIOLOGICAL SUBST NOS  . Knee pain   Past Medical History  Diagnosis Date  . Allergic rhinitis   . Hypertension   . Hyperlipidemia   . Hyperglycemia    Past Surgical History  Procedure Date  . Baker's cyst - aspirated 03/2005   History  Substance Use Topics  . Smoking status: Former Smoker -- 0.1 packs/day    Types: Cigarettes    Quit date: 08/06/2011  . Smokeless tobacco: Not on file   Comment: non smoker  . Alcohol Use: Yes   Family History  Problem Relation Age of Onset  . Heart disease Mother   . Thyroid disease Mother   . Hypertension Mother   . Brain cancer Mother    No Known Allergies Current Outpatient Prescriptions on File Prior to Visit  Medication Sig Dispense Refill  . aspirin 81 MG tablet Take 81 mg by mouth daily.        . cetirizine (ZYRTEC) 10 MG tablet Take 10 mg by mouth as directed.        . fenofibrate  micronized (LOFIBRA) 134 MG capsule TAKE 1 CAPSULE DAILY  90 capsule  0  . Multiple Vitamin (MULTIVITAMIN) tablet Take 1 tablet by mouth daily.        Marland Kitchen triamterene-hydrochlorothiazide (DYAZIDE) 37.5-25 MG per capsule TAKE 1 CAPSULE DAILY  90 capsule  0  . calcium carbonate (TUMS - DOSED IN MG ELEMENTAL CALCIUM) 500 MG chewable tablet Chew 1 tablet by mouth daily.         Current Facility-Administered Medications on File Prior to Visit  Medication Dose Route Frequency Provider Last Rate Last Dose  . DISCONTD: triamcinolone acetonide (KENALOG-40) injection 40 mg  40 mg Intra-articular Once Hannah Beat, MD          Review of Systems Review of Systems  Constitutional: Negative for fever, appetite change, fatigue and unexpected weight change.  Eyes: Negative for pain and visual disturbance.  Respiratory: Negative for cough and shortness of breath.   Cardiovascular: Negative for cp or palpitations    Gastrointestinal: Negative for nausea, diarrhea and constipation.  Genitourinary: Negative for urgency and frequency.  Skin: Negative for pallor or rash   Neurological:  Negative for weakness, light-headedness, numbness and headaches.  Hematological: Negative for adenopathy. Does not bruise/bleed easily.  Psychiatric/Behavioral: Negative for dysphoric mood. The patient is not nervous/anxious.         Objective:   Physical Exam  Constitutional: He appears well-developed and well-nourished. No distress.       overwt and well appearing   HENT:  Head: Normocephalic and atraumatic.  Right Ear: External ear normal.  Left Ear: External ear normal.  Nose: Nose normal.  Mouth/Throat: Oropharynx is clear and moist.  Eyes: Conjunctivae and EOM are normal. Pupils are equal, round, and reactive to light. No scleral icterus.  Neck: Normal range of motion. Neck supple. No JVD present. Carotid bruit is not present. No thyromegaly present.  Cardiovascular: Normal rate, regular rhythm, normal heart  sounds and intact distal pulses.  Exam reveals no gallop.   Pulmonary/Chest: Breath sounds normal. No respiratory distress. He has no wheezes.  Abdominal: Soft. Bowel sounds are normal. He exhibits no distension, no abdominal bruit and no mass. There is no tenderness.  Musculoskeletal: Normal range of motion. He exhibits no edema and no tenderness.       Nl rom knees today  Lymphadenopathy:    He has no cervical adenopathy.  Neurological: He is alert. He has normal reflexes. No cranial nerve deficit. He exhibits normal muscle tone. Coordination normal.  Skin: Skin is warm and dry. No rash noted. No erythema. No pallor.  Psychiatric: He has a normal mood and affect.          Assessment & Plan:

## 2012-03-12 NOTE — Assessment & Plan Note (Signed)
bp not in good control today  No changes needed  Disc lifstyle change with low sodium diet and exercise   Lab today  Will f/u for another reading in 1 mo- if not improved will be more aggressive with tx  This could be from 15 lb wt gain - disc strategy for wt loss

## 2012-03-12 NOTE — Assessment & Plan Note (Signed)
a1c today  Worse diet and wt gain- this may be up  No sympt Disc risk for DM and need for wt loss

## 2012-03-12 NOTE — Assessment & Plan Note (Signed)
Due for labs  Rev low sat fat diet- is fair Needs more exercise F/u 1 mo to rev

## 2012-03-12 NOTE — Patient Instructions (Addendum)
Labs today  Work hard to loose weight - cut portions/ fats /sugars  Use artificial sweetner in tea Work towards 5 days of exercise per week Blood pressure is high today - will need to re check that  No change in medicine  Follow up in 4-6 weeks - to review labs and blood pressure

## 2012-04-23 ENCOUNTER — Encounter: Payer: Self-pay | Admitting: Family Medicine

## 2012-04-23 ENCOUNTER — Ambulatory Visit (INDEPENDENT_AMBULATORY_CARE_PROVIDER_SITE_OTHER): Payer: BC Managed Care – PPO | Admitting: Family Medicine

## 2012-04-23 VITALS — BP 122/64 | HR 87 | Temp 98.3°F | Ht 74.0 in | Wt 291.8 lb

## 2012-04-23 DIAGNOSIS — I10 Essential (primary) hypertension: Secondary | ICD-10-CM

## 2012-04-23 DIAGNOSIS — E7800 Pure hypercholesterolemia, unspecified: Secondary | ICD-10-CM

## 2012-04-23 DIAGNOSIS — R7309 Other abnormal glucose: Secondary | ICD-10-CM

## 2012-04-23 DIAGNOSIS — N644 Mastodynia: Secondary | ICD-10-CM

## 2012-04-23 NOTE — Assessment & Plan Note (Signed)
Improved but still aim for trig under 200 and LDL under 130 Disc goals for lipids and reasons to control them Rev labs with pt Rev low sat fat diet in detail  Will continue med and diet and re check fasting lab in 3 mo

## 2012-04-23 NOTE — Progress Notes (Signed)
Subjective:    Patient ID: Charles Watkins, male    DOB: 10-Oct-1965, 46 y.o.   MRN: [id number]  HPI  Here for f/u of HTN and chronic conditions  Also his R nipple has been sore lately He does lift a lot   bp is stable today  No cp or palpitations or headaches or edema  No side effects to medicines  BP Readings from Last 3 Encounters:  04/23/12 122/64  03/12/12 148/90  12/28/10 120/88    Much better   Diet - is trying to eat right now , avoids fried foods and pork and beef, also not a lot of salt  Trying to reduce portions - better now but can do better  Exercise- started back on that - walks the dog and walks on treadmill  Wt down 2 lb bmi is 37-- is muscular also  Hyperglycemia Lab Results  Component Value Date   HGBA1C 6.4 03/12/2012   did not go up with wt gain  He avoids sweet  Has cut out the sweet tea   Lipids-on lofibra 134 mg Lab Results  Component Value Date   CHOL 237* 03/12/2012   CHOL 200 07/06/2010   CHOL 202* 03/07/2010   Lab Results  Component Value Date   HDL 43.00 03/12/2012   HDL 40.98* 07/06/2010   HDL 35.00* 03/07/2010   Lab Results  Component Value Date   LDLCALC 107* 08/05/2009   LDLCALC 122* 10/05/2008   LDLCALC 129* 05/04/2008   Lab Results  Component Value Date   TRIG 244.0* 03/12/2012   TRIG 289.0* 07/06/2010   TRIG 534.0* 03/07/2010   Lab Results  Component Value Date   CHOLHDL 6 03/12/2012   CHOLHDL 5 07/06/2010   CHOLHDL 6 03/07/2010   Lab Results  Component Value Date   LDLDIRECT 136.5 03/12/2012   LDLDIRECT 112.3 07/06/2010   LDLDIRECT 88.2 03/07/2010   avoid fried food and red meat  Loves eggs and shellfish- staying away from now  Overall doing a lot better   Review of Systems Review of Systems  Constitutional: Negative for fever, appetite change, fatigue and unexpected weight change.  Eyes: Negative for pain and visual disturbance.  Respiratory: Negative for cough and shortness of breath.   Cardiovascular: Negative for cp or  palpitations    Gastrointestinal: Negative for nausea, diarrhea and constipation.  Genitourinary: Negative for urgency and frequency. pos for some tenderness in L breast area  Skin: Negative for pallor or rash   Neurological: Negative for weakness, light-headedness, numbness and headaches.  Hematological: Negative for adenopathy. Does not bruise/bleed easily.  Psychiatric/Behavioral: Negative for dysphoric mood. The patient is not nervous/anxious.         Objective:   Physical Exam  Constitutional: He appears well-developed and well-nourished. No distress.       obese and well appearing   HENT:  Head: Normocephalic and atraumatic.  Mouth/Throat: Oropharynx is clear and moist.  Eyes: Conjunctivae and EOM are normal. Pupils are equal, round, and reactive to light. No scleral icterus.  Neck: Normal range of motion. Neck supple. No JVD present. Carotid bruit is not present. No thyromegaly present.  Cardiovascular: Normal rate, regular rhythm, normal heart sounds and intact distal pulses.  Exam reveals no gallop.   Pulmonary/Chest: Effort normal and breath sounds normal. No respiratory distress. He has no wheezes. He exhibits no tenderness.  Abdominal: Soft. Bowel sounds are normal. He exhibits no distension, no abdominal bruit and no mass. There is no tenderness.  Genitourinary:  R breast is slt tender around the nipple laterally No lump felt No skin change or nipple discharge  Musculoskeletal: Normal range of motion. He exhibits no edema.  Lymphadenopathy:    He has no cervical adenopathy.  Neurological: He is alert. He has normal reflexes. No cranial nerve deficit. He exhibits normal muscle tone. Coordination normal.  Skin: Skin is warm. No rash noted. No erythema. No pallor.  Psychiatric: He has a normal mood and affect.          Assessment & Plan:

## 2012-04-23 NOTE — Patient Instructions (Signed)
Keep up the good work with diet and exercise and weight loss  Schedule fasting labs in 3 months  Avoid red meat/ fried foods/ egg yolks/ fatty breakfast meats/ butter, cheese and high fat dairy/ and shellfish   Also watch sugar in diet  If your breast/ nipple soreness does not improve in 2 weeks please call and let me know (for now use a warm compress) We would order an ultrasound of the area  No change in medicines

## 2012-04-23 NOTE — Assessment & Plan Note (Signed)
bp in fair control at this time - much improved with lifestyle change! Will keep working on wt loss  No changes needed  Disc lifstyle change with low sodium diet and exercise  Reviewed labs in detail

## 2012-04-23 NOTE — Assessment & Plan Note (Signed)
R nipple pain - may be from lifting and trauma Will try warm comp Nl exam If no imp in 2 weeks will call for eval-consider [place]

## 2012-04-23 NOTE — Assessment & Plan Note (Signed)
This is stable Expect imp in a1c in 3 mo with change in diet/exercise and wt

## 2012-06-04 ENCOUNTER — Other Ambulatory Visit: Payer: Self-pay | Admitting: Family Medicine

## 2012-07-24 ENCOUNTER — Other Ambulatory Visit (INDEPENDENT_AMBULATORY_CARE_PROVIDER_SITE_OTHER): Payer: BC Managed Care – PPO

## 2012-07-24 DIAGNOSIS — E7800 Pure hypercholesterolemia, unspecified: Secondary | ICD-10-CM

## 2012-07-24 DIAGNOSIS — R7309 Other abnormal glucose: Secondary | ICD-10-CM

## 2012-07-24 DIAGNOSIS — I10 Essential (primary) hypertension: Secondary | ICD-10-CM

## 2012-07-24 LAB — COMPREHENSIVE METABOLIC PANEL
ALT: 33 U/L (ref 0–53)
AST: 29 U/L (ref 0–37)
Albumin: 4.3 g/dL (ref 3.5–5.2)
Alkaline Phosphatase: 45 U/L (ref 39–117)
BUN: 18 mg/dL (ref 6–23)
CO2: 30 mEq/L (ref 19–32)
Calcium: 9.5 mg/dL (ref 8.4–10.5)
Chloride: 103 mEq/L (ref 96–112)
Creatinine, Ser: 1.6 mg/dL — ABNORMAL HIGH (ref 0.4–1.5)
GFR: 60.76 mL/min (ref >60.00)
Glucose, Bld: 105 mg/dL — ABNORMAL HIGH (ref 70–99)
Potassium: 4.1 mEq/L (ref 3.5–5.1)
Sodium: 139 mEq/L (ref 135–145)
Total Bilirubin: 0.8 mg/dL (ref 0.3–1.2)
Total Protein: 8.4 g/dL — ABNORMAL HIGH (ref 6.0–8.3)

## 2012-07-24 LAB — LIPID PANEL
Cholesterol: 191 mg/dL (ref 0–200)
HDL: 36.5 mg/dL — ABNORMAL LOW (ref >39.00)
LDL Cholesterol: 126 mg/dL — ABNORMAL HIGH (ref 0–99)
Total CHOL/HDL Ratio: 5
Triglycerides: 141 mg/dL (ref 0.0–149.0)
VLDL: 28.2 mg/dL (ref 0.0–40.0)

## 2012-07-24 LAB — HEMOGLOBIN A1C: Hgb A1c MFr Bld: 6.2 % (ref 4.6–6.5)

## 2012-07-29 ENCOUNTER — Encounter: Payer: Self-pay | Admitting: Family Medicine

## 2012-07-29 ENCOUNTER — Ambulatory Visit (INDEPENDENT_AMBULATORY_CARE_PROVIDER_SITE_OTHER): Payer: BC Managed Care – PPO | Admitting: Family Medicine

## 2012-07-29 VITALS — BP 142/86 | HR 82 | Temp 97.9°F | Ht 74.0 in | Wt 298.8 lb

## 2012-07-29 DIAGNOSIS — R748 Abnormal levels of other serum enzymes: Secondary | ICD-10-CM

## 2012-07-29 DIAGNOSIS — N289 Disorder of kidney and ureter, unspecified: Secondary | ICD-10-CM | POA: Insufficient documentation

## 2012-07-29 DIAGNOSIS — I10 Essential (primary) hypertension: Secondary | ICD-10-CM

## 2012-07-29 LAB — POCT URINALYSIS DIPSTICK
Bilirubin, UA: NEGATIVE
Blood, UA: NEGATIVE
Glucose, UA: NEGATIVE
Ketones, UA: NEGATIVE
Leukocytes, UA: NEGATIVE
Nitrite, UA: NEGATIVE
Spec Grav, UA: 1.01
Urobilinogen, UA: 0.2
pH, UA: 7.5

## 2012-07-29 LAB — POCT UA - MICROSCOPIC ONLY
Bacteria, U Microscopic: 0
Casts, Ur, LPF, POC: 0
RBC, urine, microscopic: 0
WBC, Ur, HPF, POC: 0
Yeast, UA: 0

## 2012-07-29 MED ORDER — AMLODIPINE BESYLATE 5 MG PO TABS: 5.0000 mg | ORAL_TABLET | Freq: Every day | ORAL | Status: DC

## 2012-07-29 NOTE — Patient Instructions (Addendum)
Stop your dyazide (triamterine-hctz) Start amlodipine 5 mg once daily instead- this is for your blood pressure and is not as hard on the kidney If you have any side effects or problems let me know  Eat a healthy diet and try to get some exercise  Drink more water -less coffee  Follow up with me in 2-3 weeks - we will see if the new blood pressure medicine is working and re check your labs

## 2012-07-29 NOTE — Progress Notes (Signed)
Subjective:    Patient ID: Charles Watkins, male    DOB: 07/21/1966, 46 y.o.   MRN: [id number]  HPI Here for f/u of renal insuff on latest labs, also chronic medical problems    Chemistry      Component Value Date/Time   NA 139 07/24/2012 0803   K 4.1 07/24/2012 0803   CL 103 07/24/2012 0803   CO2 30 07/24/2012 0803   BUN 18 07/24/2012 0803   CREATININE 1.6* 07/24/2012 0803      Component Value Date/Time   CALCIUM 9.5 07/24/2012 0803   ALKPHOS 45 07/24/2012 0803   AST 29 07/24/2012 0803   ALT 33 07/24/2012 0803   BILITOT 0.8 07/24/2012 0803     cr has never been this high  Uncle was on dialysis - does not know what caused his kidney failure  Before his test - he just did not drink enough water -- usually drinks a lot more , drank a lot of coffee that week  tx for HTN with maxzide (never been on any other meds for HTN)  No swelling   Urinary symptoms - no dysuria or blood  Only gets frequency when he drinks coffee  No hx of kidney stones  Other meds - no otc meds except mvi  bp is stable today  No cp or palpitations or headaches or edema  No side effects to medicines  BP Readings from Last 3 Encounters:  07/29/12 142/86  04/23/12 122/64  03/12/12 148/90    Outside the office his bp has been very good  Is walking every day for exercise now   Lipids Lab Results  Component Value Date   CHOL 191 07/24/2012   CHOL 237* 03/12/2012   CHOL 200 07/06/2010   Lab Results  Component Value Date   HDL 36.50* 07/24/2012   HDL 43.00 03/12/2012   HDL 78.29* 07/06/2010   Lab Results  Component Value Date   LDLCALC 126* 07/24/2012   LDLCALC 107* 08/05/2009   LDLCALC 122* 10/05/2008   Lab Results  Component Value Date   TRIG 141.0 07/24/2012   TRIG 244.0* 03/12/2012   TRIG 289.0* 07/06/2010   Lab Results  Component Value Date   CHOLHDL 5 07/24/2012   CHOLHDL 6 03/12/2012   CHOLHDL 5 07/06/2010   Lab Results  Component Value Date   LDLDIRECT 136.5 03/12/2012   LDLDIRECT  112.3 07/06/2010   LDLDIRECT 88.2 03/07/2010   overall improved Is cutting out fried foods and eating better   Hyperglycemia  Lab Results  Component Value Date   HGBA1C 6.2 07/24/2012     Wt is up 7lb - he has been eating better , but thinks he needs to change exercise -more cardio  Is on asa- 81 mg daily   Patient Active Problem List  Diagnosis  . HYPERCHOLESTEROLEMIA  . HYPERTENSION  . ALLERGIC RHINITIS  . KNEE PAIN, LEFT  . BAKER'S CYST  . SLEEP APNEA  . HYPERGLYCEMIA  . ADVEF, DRUG/MEDICINAL/BIOLOGICAL SUBST NOS  . Knee pain  . Breast pain in male  . Renal insufficiency   Past Medical History  Diagnosis Date  . Allergic rhinitis   . Hypertension   . Hyperlipidemia   . Hyperglycemia    Past Surgical History  Procedure Date  . Baker's cyst - aspirated 03/2005   History  Substance Use Topics  . Smoking status: Former Smoker -- 0.1 packs/day    Types: Cigarettes    Quit date: 08/06/2011  . Smokeless tobacco:  Not on file     Comment: non smoker  . Alcohol Use: Yes     Comment: drinks on weekend   Family History  Problem Relation Age of Onset  . Heart disease Mother   . Thyroid disease Mother   . Hypertension Mother   . Brain cancer Mother    No Known Allergies Current Outpatient Prescriptions on File Prior to Visit  Medication Sig Dispense Refill  . aspirin 81 MG tablet Take 81 mg by mouth daily.        . calcium carbonate (TUMS - DOSED IN MG ELEMENTAL CALCIUM) 500 MG chewable tablet Chew 1 tablet by mouth daily.        . cetirizine (ZYRTEC) 10 MG tablet Take 10 mg by mouth as directed.        . fenofibrate micronized (LOFIBRA) 134 MG capsule TAKE 1 CAPSULE DAILY  90 capsule  0  . Multiple Vitamin (MULTIVITAMIN) tablet Take 1 tablet by mouth daily.        Marland Kitchen amLODipine (NORVASC) 5 MG tablet Take 1 tablet (5 mg total) by mouth daily.  30 tablet  2      Review of Systems Review of Systems  Constitutional: Negative for fever, appetite change, fatigue and  unexpected weight change.  Eyes: Negative for pain and visual disturbance.  Respiratory: Negative for cough and shortness of breath.   Cardiovascular: Negative for cp or palpitations    Gastrointestinal: Negative for nausea, diarrhea and constipation.  Genitourinary: Negative for urgency and frequency.  Skin: Negative for pallor or rash   Neurological: Negative for weakness, light-headedness, numbness and headaches.  Hematological: Negative for adenopathy. Does not bruise/bleed easily.  Psychiatric/Behavioral: Negative for dysphoric mood. The patient is not nervous/anxious.         Objective:   Physical Exam  Constitutional: He appears well-developed and well-nourished. No distress.       obese and well appearing   HENT:  Head: Normocephalic and atraumatic.  Mouth/Throat: Oropharynx is clear and moist.  Eyes: Conjunctivae normal and EOM are normal. Pupils are equal, round, and reactive to light. Right eye exhibits no discharge. Left eye exhibits no discharge. No scleral icterus.  Neck: Normal range of motion. Neck supple. No JVD present. Carotid bruit is not present. No thyromegaly present.  Cardiovascular: Normal rate, regular rhythm, normal heart sounds and intact distal pulses.  Exam reveals no gallop.   Pulmonary/Chest: Effort normal and breath sounds normal. No respiratory distress. He has no wheezes.  Abdominal: Soft. Bowel sounds are normal. He exhibits no distension and no mass. There is no tenderness.       No suprapubic tenderness or fullness    Musculoskeletal: He exhibits no edema.       No cva tenderness   Lymphadenopathy:    He has no cervical adenopathy.  Neurological: He is alert. He has normal reflexes. No cranial nerve deficit. He exhibits normal muscle tone. Coordination normal.  Skin: Skin is warm and dry. No rash noted. No erythema. No pallor.  Psychiatric: He has a normal mood and affect.          Assessment & Plan:

## 2012-07-29 NOTE — Assessment & Plan Note (Signed)
In light of increased cr of 1.6- will d/c maxzide and start amlodipine 5 mg  Will update if side eff  Will watch bp at home  bp in fair control at this time  Disc lifstyle change with low sodium diet and exercise F/u 2-3 weeks for lab and visit

## 2012-07-29 NOTE — Assessment & Plan Note (Signed)
New cr 1.6 Pt confesses to low fluid intake the week before Neg ua dip and spin  Sugar controlled bp controlled  No nsaids or otc meds  Will change maxzide to norvasc and f/u 2-3 wk for visit and labs If diuretic was needed may consider lasix  Stressed imp of inc fluid/water intake

## 2012-08-11 ENCOUNTER — Other Ambulatory Visit: Payer: Self-pay | Admitting: Family Medicine

## 2012-08-12 ENCOUNTER — Encounter: Payer: Self-pay | Admitting: Family Medicine

## 2012-08-12 ENCOUNTER — Ambulatory Visit (INDEPENDENT_AMBULATORY_CARE_PROVIDER_SITE_OTHER): Payer: BC Managed Care – PPO | Admitting: Family Medicine

## 2012-08-12 VITALS — BP 146/94 | HR 85 | Temp 98.2°F | Ht 74.0 in | Wt 298.8 lb

## 2012-08-12 DIAGNOSIS — I10 Essential (primary) hypertension: Secondary | ICD-10-CM

## 2012-08-12 DIAGNOSIS — Z23 Encounter for immunization: Secondary | ICD-10-CM

## 2012-08-12 DIAGNOSIS — N289 Disorder of kidney and ureter, unspecified: Secondary | ICD-10-CM

## 2012-08-12 LAB — RENAL FUNCTION PANEL
Albumin: 4.1 g/dL (ref 3.5–5.2)
BUN: 16 mg/dL (ref 6–23)
CO2: 26 mEq/L (ref 19–32)
Calcium: 9.6 mg/dL (ref 8.4–10.5)
Chloride: 103 mEq/L (ref 96–112)
Creatinine, Ser: 1.5 mg/dL (ref 0.4–1.5)
GFR: 64.48 mL/min (ref >60.00)
Glucose, Bld: 115 mg/dL — ABNORMAL HIGH (ref 70–99)
Phosphorus: 4 mg/dL (ref 2.3–4.6)
Potassium: 4.3 mEq/L (ref 3.5–5.1)
Sodium: 136 mEq/L (ref 135–145)

## 2012-08-12 MED ORDER — AMLODIPINE BESYLATE 10 MG PO TABS: 10.0000 mg | ORAL_TABLET | Freq: Every day | ORAL | Status: DC

## 2012-08-12 NOTE — Assessment & Plan Note (Signed)
In hypertensive pt with nl ua  Changed from maxzide to amlodipine and also drinking much more water Renal prof today and adv

## 2012-08-12 NOTE — Patient Instructions (Addendum)
Flu shot today Labs today  Keep drinking lots of water  Increase your amlodipine from 5 to 10 mg once daily  Follow up in about 6-8 weeks please

## 2012-08-12 NOTE — Assessment & Plan Note (Signed)
Not quite at goal with amlodipine 5 so will inc to 10 mg once daily  Pt tolerates it well Hope for imp in renal profile today Disc lifestyle change

## 2012-08-12 NOTE — Progress Notes (Signed)
Subjective:    Patient ID: Charles Watkins, male    DOB: 05-16-1966, 46 y.o.   MRN: [id number]  HPI Here for f/u of HTN and new renal insuff    Chemistry      Component Value Date/Time   NA 139 07/24/2012 0803   K 4.1 07/24/2012 0803   CL 103 07/24/2012 0803   CO2 30 07/24/2012 0803   BUN 18 07/24/2012 0803   CREATININE 1.6* 07/24/2012 0803      Component Value Date/Time   CALCIUM 9.5 07/24/2012 0803   ALKPHOS 45 07/24/2012 0803   AST 29 07/24/2012 0803   ALT 33 07/24/2012 0803   BILITOT 0.8 07/24/2012 0803      bp is stable today  No cp or palpitations or headaches or edema  No side effects to medicines -now on amlodipine changed from maxzide   (no problems at all)  Has only checked once at drug store 130/90  BP Readings from Last 3 Encounters:  08/12/12 146/94  07/29/12 142/86  04/23/12 122/64    Wt is stable - still overwt   Is drinking a lot more water  No exercise yet   Will get flu shot today  Patient Active Problem List  Diagnosis  . HYPERCHOLESTEROLEMIA  . HYPERTENSION  . ALLERGIC RHINITIS  . KNEE PAIN, LEFT  . BAKER'S CYST  . SLEEP APNEA  . HYPERGLYCEMIA  . ADVEF, DRUG/MEDICINAL/BIOLOGICAL SUBST NOS  . Knee pain  . Breast pain in male  . Renal insufficiency   Past Medical History  Diagnosis Date  . Allergic rhinitis   . Hypertension   . Hyperlipidemia   . Hyperglycemia    Past Surgical History  Procedure Date  . Baker's cyst - aspirated 03/2005   History  Substance Use Topics  . Smoking status: Former Smoker -- 0.1 packs/day    Types: Cigarettes    Quit date: 08/06/2011  . Smokeless tobacco: Not on file     Comment: non smoker  . Alcohol Use: Yes     Comment: drinks on weekend   Family History  Problem Relation Age of Onset  . Heart disease Mother   . Thyroid disease Mother   . Hypertension Mother   . Brain cancer Mother    No Known Allergies Current Outpatient Prescriptions on File Prior to Visit  Medication Sig Dispense  Refill  . amLODipine (NORVASC) 5 MG tablet Take 1 tablet (5 mg total) by mouth daily.  30 tablet  2  . aspirin 81 MG tablet Take 81 mg by mouth daily.        . calcium carbonate (TUMS - DOSED IN MG ELEMENTAL CALCIUM) 500 MG chewable tablet Chew 1 tablet by mouth daily.        . cetirizine (ZYRTEC) 10 MG tablet Take 10 mg by mouth as directed.        . fenofibrate micronized (LOFIBRA) 134 MG capsule TAKE 1 CAPSULE DAILY  90 capsule  1  . Multiple Vitamin (MULTIVITAMIN) tablet Take 1 tablet by mouth daily.          Review of Systems Review of Systems  Constitutional: Negative for fever, appetite change, fatigue and unexpected weight change.  Eyes: Negative for pain and visual disturbance.  Respiratory: Negative for cough and shortness of breath.   Cardiovascular: Negative for cp or palpitations    Gastrointestinal: Negative for nausea, diarrhea and constipation.  Genitourinary: Negative for urgency and frequency. neg for blood in urine or flank pain  Skin:  Negative for pallor or rash   Neurological: Negative for weakness, light-headedness, numbness and headaches.  Hematological: Negative for adenopathy. Does not bruise/bleed easily.  Psychiatric/Behavioral: Negative for dysphoric mood. The patient is not nervous/anxious.         Objective:   Physical Exam  Constitutional: He appears well-developed and well-nourished. No distress.       overwt and well app  HENT:  Head: Normocephalic and atraumatic.  Eyes: Conjunctivae normal and EOM are normal. Pupils are equal, round, and reactive to light. Right eye exhibits no discharge. Left eye exhibits no discharge. No scleral icterus.  Neck: Normal range of motion. Neck supple. No JVD present. Carotid bruit is not present. No thyromegaly present.  Cardiovascular: Normal rate, regular rhythm, normal heart sounds and intact distal pulses.  Exam reveals no gallop.   Pulmonary/Chest: Effort normal and breath sounds normal. No respiratory distress. He  has no wheezes. He exhibits no tenderness.  Abdominal: Soft. Bowel sounds are normal. He exhibits no distension, no abdominal bruit and no mass. There is no tenderness.  Musculoskeletal: He exhibits no edema.       No cva tenderness   Lymphadenopathy:    He has no cervical adenopathy.  Neurological: He is alert. He has normal reflexes. No cranial nerve deficit. He exhibits normal muscle tone. Coordination normal.  Skin: Skin is warm and dry. No rash noted. No erythema. No pallor.  Psychiatric: He has a normal mood and affect.          Assessment & Plan:

## 2012-08-18 ENCOUNTER — Encounter: Payer: Self-pay | Admitting: *Deleted

## 2012-09-02 ENCOUNTER — Other Ambulatory Visit: Payer: Self-pay | Admitting: Family Medicine

## 2012-09-23 ENCOUNTER — Encounter: Payer: Self-pay | Admitting: Family Medicine

## 2012-09-23 ENCOUNTER — Encounter: Payer: Self-pay | Admitting: *Deleted

## 2012-09-23 ENCOUNTER — Ambulatory Visit (INDEPENDENT_AMBULATORY_CARE_PROVIDER_SITE_OTHER): Payer: BC Managed Care – PPO | Admitting: Family Medicine

## 2012-09-23 VITALS — BP 138/86 | HR 82 | Temp 98.3°F | Ht 74.0 in | Wt 298.8 lb

## 2012-09-23 DIAGNOSIS — E669 Obesity, unspecified: Secondary | ICD-10-CM

## 2012-09-23 DIAGNOSIS — N289 Disorder of kidney and ureter, unspecified: Secondary | ICD-10-CM

## 2012-09-23 DIAGNOSIS — I10 Essential (primary) hypertension: Secondary | ICD-10-CM

## 2012-09-23 LAB — RENAL FUNCTION PANEL
Albumin: 4 g/dL (ref 3.5–5.2)
BUN: 12 mg/dL (ref 6–23)
CO2: 27 mEq/L (ref 19–32)
Calcium: 9.1 mg/dL (ref 8.4–10.5)
Chloride: 104 mEq/L (ref 96–112)
Creatinine, Ser: 1.3 mg/dL (ref 0.4–1.5)
GFR: 80.15 mL/min (ref >60.00)
Glucose, Bld: 120 mg/dL — ABNORMAL HIGH (ref 70–99)
Phosphorus: 3.3 mg/dL (ref 2.3–4.6)
Potassium: 3.6 mEq/L (ref 3.5–5.1)
Sodium: 136 mEq/L (ref 135–145)

## 2012-09-23 MED ORDER — FENOFIBRATE MICRONIZED 134 MG PO CAPS: 134.0000 mg | ORAL_CAPSULE | Freq: Every day | ORAL | Status: DC

## 2012-09-23 MED ORDER — AMLODIPINE BESYLATE 10 MG PO TABS: 10.0000 mg | ORAL_TABLET | Freq: Every day | ORAL | Status: DC

## 2012-09-23 NOTE — Assessment & Plan Note (Signed)
bp in fair control at this time  No changes needed  Disc lifstyle change with low sodium diet and exercise  Will continue amlodipine  F/u 6 mo  Renal panel today

## 2012-09-23 NOTE — Progress Notes (Signed)
Subjective:    Patient ID: Charles Watkins, male    DOB: 10-30-1965, 47 y.o.   MRN: [id number]  HPI Here for f/u of chronic medical problems   bp is stable today  No cp or palpitations or headaches or edema  No side effects to medicines - doing well with amlodipine now  BP Readings from Last 3 Encounters:  09/23/12 138/86  08/12/12 146/94  07/29/12 142/86    On amlodipine now - off diuretic  Stable    Has hx of renal insuff Adv to drink more water Now he is drinking 6-7 of 16.5 oz servings (a bit less on the weekends) He also avoids nsaids as a rule - very rare  Lab Results  Component Value Date   CREATININE 1.5 08/12/2012   BUN 16 08/12/2012   NA 136 08/12/2012   K 4.3 08/12/2012   CL 103 08/12/2012   CO2 26 08/12/2012    Wt is the same with bmi of 38  Is trying to eat healthier- just eats all the time   (? Due to not smoking) Needs to get rid of the snacks - but he does avoid junk food  Has started some exercise - 10 min on treadmill every am   Patient Active Problem List  Diagnosis  . HYPERCHOLESTEROLEMIA  . HYPERTENSION  . ALLERGIC RHINITIS  . KNEE PAIN, LEFT  . BAKER'S CYST  . SLEEP APNEA  . HYPERGLYCEMIA  . ADVEF, DRUG/MEDICINAL/BIOLOGICAL SUBST NOS  . Knee pain  . Breast pain in male  . Renal insufficiency   Past Medical History  Diagnosis Date  . Allergic rhinitis   . Hypertension   . Hyperlipidemia   . Hyperglycemia    Past Surgical History  Procedure Date  . Baker's cyst - aspirated 03/2005   History  Substance Use Topics  . Smoking status: Former Smoker -- 0.1 packs/day    Types: Cigarettes    Quit date: 08/06/2011  . Smokeless tobacco: Not on file     Comment: non smoker  . Alcohol Use: Yes     Comment: drinks on weekend   Family History  Problem Relation Age of Onset  . Heart disease Mother   . Thyroid disease Mother   . Hypertension Mother   . Brain cancer Mother    No Known Allergies Current Outpatient Prescriptions on  File Prior to Visit  Medication Sig Dispense Refill  . amLODipine (NORVASC) 10 MG tablet Take 1 tablet (10 mg total) by mouth daily.  30 tablet  3  . aspirin 81 MG tablet Take 81 mg by mouth daily.        . calcium carbonate (TUMS - DOSED IN MG ELEMENTAL CALCIUM) 500 MG chewable tablet Chew 1 tablet by mouth daily.        . cetirizine (ZYRTEC) 10 MG tablet Take 10 mg by mouth every other day.       . fenofibrate micronized (LOFIBRA) 134 MG capsule TAKE 1 CAPSULE DAILY  90 capsule  1  . Multiple Vitamin (MULTIVITAMIN) tablet Take 1 tablet by mouth daily.                Review of Systems Review of Systems  Constitutional: Negative for fever, appetite change, fatigue and unexpected weight change.  Eyes: Negative for pain and visual disturbance.  Respiratory: Negative for cough and shortness of breath.   Cardiovascular: Negative for cp or palpitations    Gastrointestinal: Negative for nausea, diarrhea and constipation.  Genitourinary: Negative  for urgency and frequency.  Skin: Negative for pallor or rash   Neurological: Negative for weakness, light-headedness, numbness and headaches.  Hematological: Negative for adenopathy. Does not bruise/bleed easily.  Psychiatric/Behavioral: Negative for dysphoric mood. The patient is not nervous/anxious.         Objective:   Physical Exam  Constitutional: He appears well-developed and well-nourished. No distress.       obese and well appearing   HENT:  Head: Normocephalic and atraumatic.  Mouth/Throat: Oropharynx is clear and moist.  Eyes: Conjunctivae normal and EOM are normal. Pupils are equal, round, and reactive to light. Right eye exhibits no discharge. Left eye exhibits no discharge.  Neck: Normal range of motion. Neck supple. No JVD present. Carotid bruit is not present. No thyromegaly present.  Cardiovascular: Normal rate, regular rhythm, normal heart sounds and intact distal pulses.  Exam reveals no gallop.   Pulmonary/Chest: Effort  normal and breath sounds normal. No respiratory distress. He has no wheezes.  Abdominal: He exhibits no abdominal bruit.  Musculoskeletal: He exhibits no edema.  Lymphadenopathy:    He has no cervical adenopathy.  Neurological: He is alert. He has normal reflexes. No cranial nerve deficit. He exhibits normal muscle tone. Coordination normal.  Skin: Skin is warm and dry. No rash noted. No erythema. No pallor.  Psychiatric: He has a normal mood and affect.          Assessment & Plan:

## 2012-09-23 NOTE — Assessment & Plan Note (Signed)
With bmi consistently over 30 (somewhat muscular build) Discussed how this problem influences overall health and the risks it imposes  Reviewed plan for weight loss with lower calorie diet (via better food choices and also portion control or program like weight watchers) and exercise building up to or more than 30 minutes 5 days per week including some aerobic activity    Will work up to 30 min on treadmill in am - then he wants to begin jogging a bit Disc diet - will stop snacking all day and watch out for high calorie foods

## 2012-09-23 NOTE — Patient Instructions (Addendum)
No change in medicines Keep drinking a lot of water Follow up in about 6 months for an annual exam with labs prior Lab today for kidney check

## 2012-09-23 NOTE — Assessment & Plan Note (Signed)
Renal panel today Off diuretic and drinking much more water On amlodipine for bp and well controlled

## 2012-10-01 ENCOUNTER — Encounter: Payer: Self-pay | Admitting: Family Medicine

## 2012-10-01 ENCOUNTER — Ambulatory Visit (INDEPENDENT_AMBULATORY_CARE_PROVIDER_SITE_OTHER): Payer: BC Managed Care – PPO | Admitting: Family Medicine

## 2012-10-01 VITALS — BP 132/74 | HR 106 | Temp 99.6°F | Ht 74.0 in | Wt 291.2 lb

## 2012-10-01 DIAGNOSIS — J029 Acute pharyngitis, unspecified: Secondary | ICD-10-CM

## 2012-10-01 DIAGNOSIS — J020 Streptococcal pharyngitis: Secondary | ICD-10-CM

## 2012-10-01 LAB — POCT RAPID STREP A (OFFICE): Rapid Strep A Screen: POSITIVE — AB

## 2012-10-01 MED ORDER — AMOXICILLIN 250 MG/5ML PO SUSR: ORAL | Status: DC

## 2012-10-01 NOTE — Patient Instructions (Addendum)
You have strep throat  Get rest  Take the amoxicillin liquid as directed Drink lots of fluids  Tylenol will help fever and sore throat  If your symptoms worsen or you are unable to swallow please let me know

## 2012-10-01 NOTE — Assessment & Plan Note (Signed)
With ST and fever and ha tx with amox 500 tid (liquid will be easier to swallow) Update if not starting to improve in a week or if worsening   Given handout on sympt care- can try tylenol for pain and fever

## 2012-10-01 NOTE — Progress Notes (Signed)
Subjective:    Patient ID: Charles Watkins, male    DOB: May 15, 1966, 47 y.o.   MRN: [id number]  HPI Here with strep throat (rapid strep pos)  ST last night - did not have a fever  Fever today- has had chills and aches No uri symptoms  Headache today   No n/v/d   Patient Active Problem List  Diagnosis  . HYPERCHOLESTEROLEMIA  . HYPERTENSION  . ALLERGIC RHINITIS  . BAKER'S CYST  . SLEEP APNEA  . HYPERGLYCEMIA  . ADVEF, DRUG/MEDICINAL/BIOLOGICAL SUBST NOS  . Breast pain in male  . Renal insufficiency  . Obesity  . Strep throat   Past Medical History  Diagnosis Date  . Allergic rhinitis   . Hypertension   . Hyperlipidemia   . Hyperglycemia    Past Surgical History  Procedure Date  . Baker's cyst - aspirated 03/2005   History  Substance Use Topics  . Smoking status: Former Smoker -- 0.1 packs/day    Types: Cigarettes    Quit date: 08/06/2011  . Smokeless tobacco: Not on file     Comment: non smoker  . Alcohol Use: Yes     Comment: drinks on weekend   Family History  Problem Relation Age of Onset  . Heart disease Mother   . Thyroid disease Mother   . Hypertension Mother   . Brain cancer Mother    No Known Allergies Current Outpatient Prescriptions on File Prior to Visit  Medication Sig Dispense Refill  . amLODipine (NORVASC) 10 MG tablet Take 1 tablet (10 mg total) by mouth daily.  90 tablet  3  . aspirin 81 MG tablet Take 81 mg by mouth daily.        . calcium carbonate (TUMS - DOSED IN MG ELEMENTAL CALCIUM) 500 MG chewable tablet Chew 1 tablet by mouth daily.        . cetirizine (ZYRTEC) 10 MG tablet Take 10 mg by mouth every other day.       . fenofibrate micronized (LOFIBRA) 134 MG capsule Take 1 capsule (134 mg total) by mouth daily.  90 capsule  3  . Multiple Vitamin (MULTIVITAMIN) tablet Take 1 tablet by mouth daily.            Review of Systems Review of Systems  Constitutional: Negative for unexpected weight change. pos for fever/ malaise ENT  pos for ST, neg for rhinorrhea or sinus pain  Eyes: Negative for pain and visual disturbance.  Respiratory: Negative for cough and shortness of breath.   Cardiovascular: Negative for cp or palpitations    Gastrointestinal: Negative for nausea, diarrhea and constipation.  Genitourinary: Negative for urgency and frequency.  Skin: Negative for pallor or rash   MSK neg for stiff neck Neurological: Negative for weakness, light-headedness, numbness and pos for headache Hematological: Negative for adenopathy. Does not bruise/bleed easily.  Psychiatric/Behavioral: Negative for dysphoric mood. The patient is not nervous/anxious.         Objective:   Physical Exam  Constitutional: He appears well-developed and well-nourished. No distress.  HENT:  Head: Normocephalic and atraumatic.  Right Ear: External ear normal.  Left Ear: External ear normal.  Nose: Nose normal.  Mouth/Throat: No oropharyngeal exudate.       Diffuse erythema of throat-no exudate  Eyes: Conjunctivae normal and EOM are normal. Pupils are equal, round, and reactive to light. Right eye exhibits no discharge. Left eye exhibits no discharge.  Neck: Normal range of motion. Neck supple.  Cardiovascular: Regular rhythm and normal  heart sounds.        Tachycardic from fever   Pulmonary/Chest: Effort normal and breath sounds normal. No respiratory distress. He has no wheezes. He has no rales.  Lymphadenopathy:    He has no cervical adenopathy.  Neurological: He is alert.  Skin: Skin is warm and dry. No rash noted.  Psychiatric: He has a normal mood and affect.          Assessment & Plan:

## 2013-03-22 ENCOUNTER — Telehealth: Payer: Self-pay | Admitting: Family Medicine

## 2013-03-22 DIAGNOSIS — R7309 Other abnormal glucose: Secondary | ICD-10-CM

## 2013-03-22 DIAGNOSIS — Z0000 Encounter for general adult medical examination without abnormal findings: Secondary | ICD-10-CM | POA: Insufficient documentation

## 2013-03-22 DIAGNOSIS — E7800 Pure hypercholesterolemia, unspecified: Secondary | ICD-10-CM

## 2013-03-22 NOTE — Telephone Encounter (Signed)
Message copied by Judy Pimple on Sun Mar 22, 2013  2:23 PM ------      Message from: Alvina Chou      Created: Tue Mar 17, 2013 10:50 AM      Regarding: Lab orders for 7.21.14       Patient is scheduled for CPX labs, please order future labs, Thanks , Terri       ------

## 2013-03-23 ENCOUNTER — Other Ambulatory Visit (INDEPENDENT_AMBULATORY_CARE_PROVIDER_SITE_OTHER): Payer: BC Managed Care – PPO

## 2013-03-23 DIAGNOSIS — R7309 Other abnormal glucose: Secondary | ICD-10-CM

## 2013-03-23 DIAGNOSIS — Z0000 Encounter for general adult medical examination without abnormal findings: Secondary | ICD-10-CM

## 2013-03-23 DIAGNOSIS — N289 Disorder of kidney and ureter, unspecified: Secondary | ICD-10-CM

## 2013-03-23 DIAGNOSIS — E7800 Pure hypercholesterolemia, unspecified: Secondary | ICD-10-CM

## 2013-03-23 DIAGNOSIS — I10 Essential (primary) hypertension: Secondary | ICD-10-CM

## 2013-03-23 LAB — CBC WITH DIFFERENTIAL/PLATELET
Basophils Absolute: 0 10*3/uL (ref 0.0–0.1)
Basophils Relative: 0.3 % (ref 0.0–3.0)
Eosinophils Absolute: 0.3 10*3/uL (ref 0.0–0.7)
Eosinophils Relative: 5 % (ref 0.0–5.0)
HCT: 40.6 % (ref 39.0–52.0)
Hemoglobin: 13.7 g/dL (ref 13.0–17.0)
Lymphocytes Relative: 57.1 % — ABNORMAL HIGH (ref 12.0–46.0)
Lymphs Abs: 3.3 10*3/uL (ref 0.7–4.0)
MCHC: 33.9 g/dL (ref 30.0–36.0)
MCV: 90.7 fl (ref 78.0–100.0)
Monocytes Absolute: 0.5 10*3/uL (ref 0.1–1.0)
Monocytes Relative: 9.4 % (ref 3.0–12.0)
Neutro Abs: 1.6 10*3/uL (ref 1.4–7.7)
Neutrophils Relative %: 28.2 % — ABNORMAL LOW (ref 43.0–77.0)
Platelets: 276 10*3/uL (ref 150.0–400.0)
RBC: 4.47 Mil/uL (ref 4.22–5.81)
RDW: 14.1 % (ref 11.5–14.6)
WBC: 5.8 10*3/uL (ref 4.5–10.5)

## 2013-03-23 LAB — LIPID PANEL
Cholesterol: 185 mg/dL (ref 0–200)
HDL: 39.7 mg/dL (ref >39.00)
LDL Cholesterol: 106 mg/dL — ABNORMAL HIGH (ref 0–99)
Total CHOL/HDL Ratio: 5
Triglycerides: 197 mg/dL — ABNORMAL HIGH (ref 0.0–149.0)
VLDL: 39.4 mg/dL (ref 0.0–40.0)

## 2013-03-23 LAB — HEMOGLOBIN A1C: Hgb A1c MFr Bld: 6.4 % (ref 4.6–6.5)

## 2013-03-23 LAB — COMPREHENSIVE METABOLIC PANEL
ALT: 24 U/L (ref 0–53)
AST: 19 U/L (ref 0–37)
Albumin: 3.8 g/dL (ref 3.5–5.2)
Alkaline Phosphatase: 52 U/L (ref 39–117)
BUN: 14 mg/dL (ref 6–23)
CO2: 28 mEq/L (ref 19–32)
Calcium: 9.4 mg/dL (ref 8.4–10.5)
Chloride: 106 mEq/L (ref 96–112)
Creatinine, Ser: 1.3 mg/dL (ref 0.4–1.5)
GFR: 79.97 mL/min (ref >60.00)
Glucose, Bld: 110 mg/dL — ABNORMAL HIGH (ref 70–99)
Potassium: 4 mEq/L (ref 3.5–5.1)
Sodium: 138 mEq/L (ref 135–145)
Total Bilirubin: 0.1 mg/dL — ABNORMAL LOW (ref 0.3–1.2)
Total Protein: 7.7 g/dL (ref 6.0–8.3)

## 2013-03-23 LAB — TSH: TSH: 4.21 u[IU]/mL (ref 0.35–5.50)

## 2013-03-23 NOTE — Addendum Note (Signed)
Addended by: Baldomero Lamy on: 03/23/2013 08:12 AM   Modules accepted: Orders

## 2013-03-23 NOTE — Addendum Note (Signed)
Addended by: Baldomero Lamy on: 03/23/2013 09:25 AM   Modules accepted: Orders

## 2013-03-27 ENCOUNTER — Encounter: Payer: Self-pay | Admitting: Family Medicine

## 2013-03-27 ENCOUNTER — Ambulatory Visit (INDEPENDENT_AMBULATORY_CARE_PROVIDER_SITE_OTHER): Payer: BC Managed Care – PPO | Admitting: Family Medicine

## 2013-03-27 VITALS — BP 132/84 | HR 88 | Temp 98.2°F | Ht 73.5 in | Wt 295.2 lb

## 2013-03-27 DIAGNOSIS — Z0000 Encounter for general adult medical examination without abnormal findings: Secondary | ICD-10-CM

## 2013-03-27 DIAGNOSIS — R7309 Other abnormal glucose: Secondary | ICD-10-CM

## 2013-03-27 DIAGNOSIS — E7800 Pure hypercholesterolemia, unspecified: Secondary | ICD-10-CM

## 2013-03-27 DIAGNOSIS — I10 Essential (primary) hypertension: Secondary | ICD-10-CM

## 2013-03-27 NOTE — Progress Notes (Signed)
Subjective:    Patient ID: Charles Watkins, male    DOB: [birth year], 47 y.o.   MRN: [id number]  HPI Here for health maintenance exam and to review chronic medical problems    Busy summer  Just moved - happy where he is   Wt is up 4 lb with bmi of 38 Not taking great care of himself- so busy   Td 07 Flu shot 12/13  Diet-- not as good though he tries to watch sugar / fat not as much  Exercise- getting more now - started doing lots of yard work in new house , and goes to the gym -- elliptical 30 min   Hyperlipidemia Lab Results  Component Value Date   CHOL 185 03/23/2013   CHOL 191 07/24/2012   CHOL 237* 03/12/2012   Lab Results  Component Value Date   HDL 39.70 03/23/2013   HDL 40.98* 07/24/2012   HDL 43.00 03/12/2012   Lab Results  Component Value Date   LDLCALC 106* 03/23/2013   LDLCALC 126* 07/24/2012   LDLCALC 107* 08/05/2009   Lab Results  Component Value Date   TRIG 197.0* 03/23/2013   TRIG 141.0 07/24/2012   TRIG 244.0* 03/12/2012   Lab Results  Component Value Date   CHOLHDL 5 03/23/2013   CHOLHDL 5 07/24/2012   CHOLHDL 6 03/12/2012   Lab Results  Component Value Date   LDLDIRECT 136.5 03/12/2012   LDLDIRECT 112.3 07/06/2010   LDLDIRECT 88.2 03/07/2010  looking better  Has stopped fried food  On anti trig agent as well    Hyperglycemia Lab Results  Component Value Date   HGBA1C 6.4 03/23/2013   this is up from 6.2  No prostate ca in family  No urinary problems or nocturia  Patient Active Problem List   Diagnosis Date Noted  . Routine general medical examination at a health care facility 03/22/2013  . Strep throat 10/01/2012  . Obesity 09/23/2012  . Renal insufficiency 07/29/2012  . Breast pain in male 04/23/2012  . HYPERGLYCEMIA 08/05/2009  . HYPERTENSION 08/14/2007  . ALLERGIC RHINITIS 08/14/2007  . BAKER'S CYST 08/14/2007  . SLEEP APNEA 08/14/2007  . HYPERCHOLESTEROLEMIA 01/16/2007  . ADVEF, DRUG/MEDICINAL/BIOLOGICAL SUBST NOS 01/16/2007    Past Medical History  Diagnosis Date  . Allergic rhinitis   . Hypertension   . Hyperlipidemia   . Hyperglycemia    Past Surgical History  Procedure Laterality Date  . Baker's cyst - aspirated  03/2005   History  Substance Use Topics  . Smoking status: Former Smoker -- 0.10 packs/day    Types: Cigarettes    Quit date: 08/06/2011  . Smokeless tobacco: Not on file     Comment: non smoker  . Alcohol Use: Yes     Comment: drinks on weekend   Family History  Problem Relation Age of Onset  . Heart disease Mother   . Thyroid disease Mother   . Hypertension Mother   . Brain cancer Mother    No Known Allergies Current Outpatient Prescriptions on File Prior to Visit  Medication Sig Dispense Refill  . amLODipine (NORVASC) 10 MG tablet Take 1 tablet (10 mg total) by mouth daily.  90 tablet  3  . aspirin 81 MG tablet Take 81 mg by mouth daily.        . calcium carbonate (TUMS - DOSED IN MG ELEMENTAL CALCIUM) 500 MG chewable tablet Chew 1 tablet by mouth daily.        . cetirizine (ZYRTEC) 10 MG tablet  Take 10 mg by mouth every other day.       . fenofibrate micronized (LOFIBRA) 134 MG capsule Take 1 capsule (134 mg total) by mouth daily.  90 capsule  3  . Multiple Vitamin (MULTIVITAMIN) tablet Take 1 tablet by mouth daily.         No current facility-administered medications on file prior to visit.     Review of Systems Review of Systems  Constitutional: Negative for fever, appetite change, fatigue and unexpected weight change.  Eyes: Negative for pain and visual disturbance.  Respiratory: Negative for cough and shortness of breath.   Cardiovascular: Negative for cp or palpitations    Gastrointestinal: Negative for nausea, diarrhea and constipation.  Genitourinary: Negative for urgency and frequency.  Skin: Negative for pallor or rash   Neurological: Negative for weakness, light-headedness, numbness and headaches.  Hematological: Negative for adenopathy. Does not bruise/bleed  easily.  Psychiatric/Behavioral: Negative for dysphoric mood. The patient is not nervous/anxious.         Objective:   Physical Exam  Constitutional: He appears well-developed and well-nourished. No distress.  obese and well appearing    HENT:  Head: Normocephalic and atraumatic.  Right Ear: External ear normal.  Left Ear: External ear normal.  Mouth/Throat: Oropharynx is clear and moist.  Eyes: Conjunctivae and EOM are normal. Pupils are equal, round, and reactive to light. Right eye exhibits no discharge. Left eye exhibits no discharge. No scleral icterus.  Neck: Normal range of motion. Neck supple. No JVD present. Carotid bruit is not present. No thyromegaly present.  Cardiovascular: Normal rate, regular rhythm, normal heart sounds and intact distal pulses.  Exam reveals no gallop.   Pulmonary/Chest: Effort normal and breath sounds normal. No respiratory distress. He has no wheezes. He has no rales.  Abdominal: Soft. Bowel sounds are normal. He exhibits no distension, no abdominal bruit and no mass. There is no tenderness.  Genitourinary: Rectum normal and prostate normal.  Musculoskeletal: Normal range of motion. He exhibits no edema and no tenderness.  Lymphadenopathy:    He has no cervical adenopathy.  Neurological: He is alert. He has normal reflexes. No cranial nerve deficit. He exhibits normal muscle tone. Coordination normal.  Skin: Skin is warm and dry. No rash noted. No erythema. No pallor.  1-2 cm fatty growth/ superficial lipoma on R hip  Psychiatric: He has a normal mood and affect.          Assessment & Plan:

## 2013-03-27 NOTE — Patient Instructions (Addendum)
Work on weight loss with diet and exercise (especially cut sugar in diet) Take care of yourself  Schedule fasting lab and follow up in 6 months

## 2013-03-29 NOTE — Assessment & Plan Note (Signed)
Lab Results  Component Value Date   HGBA1C 6.4 03/23/2013    Disc borderline status for DM and immediate need for wt loss along with low glycemic diet and lifestyle change He voiced understanding and disc plan  F/u 3 mo

## 2013-03-29 NOTE — Assessment & Plan Note (Signed)
Reviewed health habits including diet and exercise and skin cancer prevention Also reviewed health mt list, fam hx and immunizations   Wellness labs rev in detail 

## 2013-03-29 NOTE — Assessment & Plan Note (Signed)
Disc goals for lipids and reasons to control them Rev labs with pt Rev low sat fat diet in detail  Continue current med

## 2013-03-29 NOTE — Assessment & Plan Note (Signed)
bp in fair control at this time  No changes needed  Disc lifstyle change with low sodium diet and exercise  Labs reviewed  

## 2013-07-09 ENCOUNTER — Other Ambulatory Visit: Payer: Self-pay

## 2013-08-04 ENCOUNTER — Ambulatory Visit: Payer: BC Managed Care – PPO

## 2013-08-28 ENCOUNTER — Other Ambulatory Visit: Payer: Self-pay | Admitting: Family Medicine

## 2013-09-22 ENCOUNTER — Telehealth: Payer: Self-pay | Admitting: Family Medicine

## 2013-09-22 DIAGNOSIS — R7309 Other abnormal glucose: Secondary | ICD-10-CM

## 2013-09-22 NOTE — Telephone Encounter (Signed)
Message copied by Abner Greenspan on Tue Sep 22, 2013  5:57 PM ------      Message from: Ellamae Sia      Created: Thu Sep 10, 2013 12:22 PM      Regarding: Lab orders for Wednesday, 1.21.15       Lab orders for 6 month f/u ------

## 2013-09-23 ENCOUNTER — Other Ambulatory Visit: Payer: BC Managed Care – PPO

## 2013-09-28 ENCOUNTER — Ambulatory Visit (INDEPENDENT_AMBULATORY_CARE_PROVIDER_SITE_OTHER): Payer: BC Managed Care – PPO | Admitting: Family Medicine

## 2013-09-28 ENCOUNTER — Encounter: Payer: Self-pay | Admitting: Family Medicine

## 2013-09-28 VITALS — BP 135/80 | HR 80 | Temp 97.7°F | Ht 73.5 in | Wt 300.5 lb

## 2013-09-28 DIAGNOSIS — E7800 Pure hypercholesterolemia, unspecified: Secondary | ICD-10-CM

## 2013-09-28 DIAGNOSIS — Z23 Encounter for immunization: Secondary | ICD-10-CM

## 2013-09-28 DIAGNOSIS — R7309 Other abnormal glucose: Secondary | ICD-10-CM

## 2013-09-28 DIAGNOSIS — I10 Essential (primary) hypertension: Secondary | ICD-10-CM

## 2013-09-28 DIAGNOSIS — E669 Obesity, unspecified: Secondary | ICD-10-CM

## 2013-09-28 LAB — COMPREHENSIVE METABOLIC PANEL
ALT: 38 U/L (ref 0–53)
AST: 26 U/L (ref 0–37)
Albumin: 3.9 g/dL (ref 3.5–5.2)
Alkaline Phosphatase: 49 U/L (ref 39–117)
BUN: 11 mg/dL (ref 6–23)
CO2: 26 mEq/L (ref 19–32)
Calcium: 9.3 mg/dL (ref 8.4–10.5)
Chloride: 106 mEq/L (ref 96–112)
Creatinine, Ser: 1.3 mg/dL (ref 0.4–1.5)
GFR: 73.64 mL/min (ref >60.00)
Glucose, Bld: 130 mg/dL — ABNORMAL HIGH (ref 70–99)
Potassium: 4.1 mEq/L (ref 3.5–5.1)
Sodium: 140 mEq/L (ref 135–145)
Total Bilirubin: 0.5 mg/dL (ref 0.3–1.2)
Total Protein: 7.9 g/dL (ref 6.0–8.3)

## 2013-09-28 LAB — LDL CHOLESTEROL, DIRECT: Direct LDL: 130.7 mg/dL

## 2013-09-28 LAB — HEMOGLOBIN A1C: Hgb A1c MFr Bld: 6.2 % (ref 4.6–6.5)

## 2013-09-28 LAB — LIPID PANEL
Cholesterol: 202 mg/dL — ABNORMAL HIGH (ref 0–200)
HDL: 43.8 mg/dL (ref >39.00)
Total CHOL/HDL Ratio: 5
Triglycerides: 161 mg/dL — ABNORMAL HIGH (ref 0.0–149.0)
VLDL: 32.2 mg/dL (ref 0.0–40.0)

## 2013-09-28 NOTE — Assessment & Plan Note (Signed)
Disc goals for lipids and reasons to control them Rev labs with pt from last draw  Pt tolerated fenofibrate well  Lab today Rev low sat fat diet in detail

## 2013-09-28 NOTE — Assessment & Plan Note (Signed)
BP: 135/80 mmHg  bp in fair control at this time  No changes needed Disc lifstyle change with low sodium diet and exercise  Labs today Given copy of the DASH diet

## 2013-09-28 NOTE — Progress Notes (Signed)
Subjective:    Patient ID: Charles Watkins, male    DOB: 10-08-1965, 48 y.o.   MRN: [id number]  HPI Here for f/u of chronic medical problems   Has been tired a bit more than usual  Does not sleep that well Nothing for exercise   Wt is up 5 lb with bmi of 39 He tries hard to stay away from sugar and fat    bp is up a little today- systolic (just took med about 5 min ago)  No cp or palpitations or headaches or edema  No side effects to medicines  BP Readings from Last 3 Encounters:  09/28/13 140/86  03/27/13 132/84  10/01/12 132/74      Hyperglycemia Lab Results  Component Value Date   HGBA1C 6.4 03/23/2013   watching out for sweets  Pasta is his weakness - 1 time every 2 weeks Not a big bread eater  He eats vegetables , a lot of green beans   Patient Active Problem List   Diagnosis Date Noted  . Routine general medical examination at a health care facility 03/22/2013  . Obesity 09/23/2012  . Renal insufficiency 07/29/2012  . Breast pain in male 04/23/2012  . HYPERGLYCEMIA 08/05/2009  . HYPERTENSION 08/14/2007  . ALLERGIC RHINITIS 08/14/2007  . BAKER'S CYST 08/14/2007  . SLEEP APNEA 08/14/2007  . HYPERCHOLESTEROLEMIA 01/16/2007  . ADVEF, DRUG/MEDICINAL/BIOLOGICAL SUBST NOS 01/16/2007   Past Medical History  Diagnosis Date  . Allergic rhinitis   . Hypertension   . Hyperlipidemia   . Hyperglycemia    Past Surgical History  Procedure Laterality Date  . Baker's cyst - aspirated  03/2005   History  Substance Use Topics  . Smoking status: Former Smoker -- 0.10 packs/day    Types: Cigarettes    Quit date: 08/06/2011  . Smokeless tobacco: Not on file     Comment: non smoker  . Alcohol Use: Yes     Comment: drinks on weekend   Family History  Problem Relation Age of Onset  . Heart disease Mother   . Thyroid disease Mother   . Hypertension Mother   . Brain cancer Mother    No Known Allergies Current Outpatient Prescriptions on File Prior to Visit    Medication Sig Dispense Refill  . amLODipine (NORVASC) 10 MG tablet Take 1 tablet (10 mg total) by mouth daily.  90 tablet  3  . aspirin 81 MG tablet Take 81 mg by mouth daily.        . cetirizine (ZYRTEC) 10 MG tablet Take 10 mg by mouth every other day.       . esomeprazole (NEXIUM) 20 MG capsule Take 20 mg by mouth as needed.      . fenofibrate micronized (LOFIBRA) 134 MG capsule TAKE 1 CAPSULE DAILY  90 capsule  1  . Multiple Vitamin (MULTIVITAMIN) tablet Take 1 tablet by mouth daily.         No current facility-administered medications on file prior to visit.    Review of Systems Review of Systems  Constitutional: Negative for fever, appetite change,  and unexpected weight change.  Eyes: Negative for pain and visual disturbance.  Respiratory: Negative for cough and shortness of breath.   Cardiovascular: Negative for cp or palpitations    Gastrointestinal: Negative for nausea, diarrhea and constipation.  Genitourinary: Negative for urgency and frequency.  Skin: Negative for pallor or rash   Neurological: Negative for weakness, light-headedness, numbness and headaches.  Hematological: Negative for adenopathy. Does not  bruise/bleed easily.  Psychiatric/Behavioral: Negative for dysphoric mood. The patient is not nervous/anxious.         Objective:   Physical Exam  Constitutional: He appears well-developed and well-nourished. No distress.  obese and well appearing   HENT:  Head: Normocephalic and atraumatic.  Mouth/Throat: Oropharynx is clear and moist.  Eyes: Conjunctivae and EOM are normal. Pupils are equal, round, and reactive to light. Right eye exhibits no discharge. Left eye exhibits no discharge. No scleral icterus.  Neck: Normal range of motion. Neck supple. No JVD present. Carotid bruit is not present. No thyromegaly present.  Cardiovascular: Normal rate, regular rhythm, normal heart sounds and intact distal pulses.  Exam reveals no gallop.   Pulmonary/Chest: Effort  normal and breath sounds normal. No respiratory distress. He has no wheezes. He exhibits no tenderness.  Abdominal: He exhibits no abdominal bruit.  Musculoskeletal: He exhibits no edema.  Lymphadenopathy:    He has no cervical adenopathy.  Neurological: He is alert. He has normal reflexes. No cranial nerve deficit. He exhibits normal muscle tone. Coordination normal.  Skin: Skin is warm and dry. No rash noted. No erythema. No pallor.  Psychiatric: He has a normal mood and affect.          Assessment & Plan:

## 2013-09-28 NOTE — Patient Instructions (Signed)
Labs today  Keep watching diet for starches/ sugar and fats  Also work on starting an exercise program -- work up to 5 days per week (you will feel better and sleep better) Follow up in 6 months for annual exam with labs prior   DASH Diet The DASH diet stands for "Dietary Approaches to Stop Hypertension." It is a healthy eating plan that has been shown to reduce high blood pressure (hypertension) in as little as 14 days, while also possibly providing other significant health benefits. These other health benefits include reducing the risk of breast cancer after menopause and reducing the risk of type 2 diabetes, heart disease, colon cancer, and stroke. Health benefits also include weight loss and slowing kidney failure in patients with chronic kidney disease.  DIET GUIDELINES  Limit salt (sodium). Your diet should contain less than 1500 mg of sodium daily.  Limit refined or processed carbohydrates. Your diet should include mostly whole grains. Desserts and added sugars should be used sparingly.  Include small amounts of heart-healthy fats. These types of fats include nuts, oils, and tub margarine. Limit saturated and trans fats. These fats have been shown to be harmful in the body. CHOOSING FOODS  The following food groups are based on a 2000 calorie diet. See your Registered Dietitian for individual calorie needs. Grains and Grain Products (6 to 8 servings daily)  Eat More Often: Whole-wheat bread, brown rice, whole-grain or wheat pasta, quinoa, popcorn without added fat or salt (air popped).  Eat Less Often: White bread, white pasta, white rice, cornbread. Vegetables (4 to 5 servings daily)  Eat More Often: Fresh, frozen, and canned vegetables. Vegetables may be raw, steamed, roasted, or grilled with a minimal amount of fat.  Eat Less Often/Avoid: Creamed or fried vegetables. Vegetables in a cheese sauce. Fruit (4 to 5 servings daily)  Eat More Often: All fresh, canned (in natural  juice), or frozen fruits. Dried fruits without added sugar. One hundred percent fruit juice ( cup [237 mL] daily).  Eat Less Often: Dried fruits with added sugar. Canned fruit in light or heavy syrup. [employer], Fish, and Poultry (2 servings or less daily. One serving is 3 to 4 oz [85-114 g]).  Eat More Often: Ninety percent or leaner ground beef, tenderloin, sirloin. Round cuts of beef, chicken breast, [place] breast. All fish. Grill, bake, or broil your meat. Nothing should be fried.  Eat Less Often/Avoid: Fatty cuts of meat, [place], or chicken leg, thigh, or wing. Fried cuts of meat or fish. Dairy (2 to 3 servings)  Eat More Often: Low-fat or fat-free milk, low-fat plain or light yogurt, reduced-fat or part-skim cheese.  Eat Less Often/Avoid: Milk (whole, 2%).Whole milk yogurt. Full-fat cheeses. Nuts, Seeds, and Legumes (4 to 5 servings per week)  Eat More Often: All without added salt.  Eat Less Often/Avoid: Salted nuts and seeds, canned beans with added salt. Fats and Sweets (limited)  Eat More Often: Vegetable oils, tub margarines without trans fats, sugar-free gelatin. Mayonnaise and salad dressings.  Eat Less Often/Avoid: Coconut oils, palm oils, butter, stick margarine, cream, half and half, cookies, candy, pie. FOR MORE INFORMATION The Dash Diet Eating Plan: www.dashdiet.org Document Released: 08/09/2011 Document Revised: 11/12/2011 Document Reviewed: 08/09/2011 [facility] Patient Information 2014 [place], [place].

## 2013-09-28 NOTE — Assessment & Plan Note (Signed)
A1C today Rev low glycemic diet and need for wt loss to prevent DM

## 2013-09-28 NOTE — Assessment & Plan Note (Signed)
Discussed how this problem influences overall health and the risks it imposes  Reviewed plan for weight loss with lower calorie diet (via better food choices and also portion control or program like weight watchers) and exercise building up to or more than 30 minutes 5 days per week including some aerobic activity   Unsure if pt is motivated enough- but stressed the importance of this

## 2013-10-06 ENCOUNTER — Other Ambulatory Visit: Payer: Self-pay | Admitting: Family Medicine

## 2013-10-07 ENCOUNTER — Telehealth: Payer: Self-pay | Admitting: Family Medicine

## 2013-10-07 NOTE — Telephone Encounter (Signed)
Relevant patient education assigned to patient using Emmi. ° °

## 2014-02-24 ENCOUNTER — Other Ambulatory Visit: Payer: Self-pay | Admitting: Family Medicine

## 2014-03-12 ENCOUNTER — Other Ambulatory Visit: Payer: Self-pay | Admitting: Family Medicine

## 2014-03-21 ENCOUNTER — Telehealth: Payer: Self-pay | Admitting: Family Medicine

## 2014-03-21 DIAGNOSIS — Z0000 Encounter for general adult medical examination without abnormal findings: Secondary | ICD-10-CM

## 2014-03-21 DIAGNOSIS — R7309 Other abnormal glucose: Secondary | ICD-10-CM

## 2014-03-21 NOTE — Telephone Encounter (Signed)
Message copied by Abner Greenspan on Sun Mar 21, 2014 12:03 PM ------      Message from: Selinda Orion J      Created: Thu Mar 18, 2014 11:41 AM      Regarding: Lab orders for Monday, 7.20.15       Patient is scheduled for CPX labs, please order future labs, Thanks , Terri       ------

## 2014-03-22 ENCOUNTER — Other Ambulatory Visit (INDEPENDENT_AMBULATORY_CARE_PROVIDER_SITE_OTHER): Payer: BC Managed Care – PPO

## 2014-03-22 DIAGNOSIS — Z0000 Encounter for general adult medical examination without abnormal findings: Secondary | ICD-10-CM

## 2014-03-22 DIAGNOSIS — R7309 Other abnormal glucose: Secondary | ICD-10-CM

## 2014-03-22 LAB — COMPREHENSIVE METABOLIC PANEL
ALT: 29 U/L (ref 0–53)
AST: 21 U/L (ref 0–37)
Albumin: 4 g/dL (ref 3.5–5.2)
Alkaline Phosphatase: 50 U/L (ref 39–117)
BUN: 16 mg/dL (ref 6–23)
CO2: 23 mEq/L (ref 19–32)
Calcium: 9 mg/dL (ref 8.4–10.5)
Chloride: 106 mEq/L (ref 96–112)
Creatinine, Ser: 1.1 mg/dL (ref 0.4–1.5)
GFR: 88.55 mL/min (ref >60.00)
Glucose, Bld: 106 mg/dL — ABNORMAL HIGH (ref 70–99)
Potassium: 4 mEq/L (ref 3.5–5.1)
Sodium: 137 mEq/L (ref 135–145)
Total Bilirubin: 0.4 mg/dL (ref 0.2–1.2)
Total Protein: 7.7 g/dL (ref 6.0–8.3)

## 2014-03-22 LAB — CBC WITH DIFFERENTIAL/PLATELET
Basophils Absolute: 0 10*3/uL (ref 0.0–0.1)
Basophils Relative: 0.4 % (ref 0.0–3.0)
Eosinophils Absolute: 0.4 10*3/uL (ref 0.0–0.7)
Eosinophils Relative: 7.6 % — ABNORMAL HIGH (ref 0.0–5.0)
HCT: 41.1 % (ref 39.0–52.0)
Hemoglobin: 13.8 g/dL (ref 13.0–17.0)
Lymphocytes Relative: 54.5 % — ABNORMAL HIGH (ref 12.0–46.0)
Lymphs Abs: 3.1 10*3/uL (ref 0.7–4.0)
MCHC: 33.5 g/dL (ref 30.0–36.0)
MCV: 89.8 fl (ref 78.0–100.0)
Monocytes Absolute: 0.6 10*3/uL (ref 0.1–1.0)
Monocytes Relative: 10.4 % (ref 3.0–12.0)
Neutro Abs: 1.5 10*3/uL (ref 1.4–7.7)
Neutrophils Relative %: 27.1 % — ABNORMAL LOW (ref 43.0–77.0)
Platelets: 304 10*3/uL (ref 150.0–400.0)
RBC: 4.58 Mil/uL (ref 4.22–5.81)
RDW: 13.7 % (ref 11.5–15.5)
WBC: 5.6 10*3/uL (ref 4.0–10.5)

## 2014-03-22 LAB — LIPID PANEL
Cholesterol: 186 mg/dL (ref 0–200)
HDL: 38.5 mg/dL — ABNORMAL LOW (ref >39.00)
LDL Cholesterol: 109 mg/dL — ABNORMAL HIGH (ref 0–99)
NonHDL: 147.5
Total CHOL/HDL Ratio: 5
Triglycerides: 195 mg/dL — ABNORMAL HIGH (ref 0.0–149.0)
VLDL: 39 mg/dL (ref 0.0–40.0)

## 2014-03-22 LAB — TSH: TSH: 2.99 u[IU]/mL (ref 0.35–4.50)

## 2014-03-22 LAB — HEMOGLOBIN A1C: Hgb A1c MFr Bld: 6.4 % (ref 4.6–6.5)

## 2014-03-30 ENCOUNTER — Ambulatory Visit (INDEPENDENT_AMBULATORY_CARE_PROVIDER_SITE_OTHER)
Admission: RE | Admit: 2014-03-30 | Discharge: 2014-03-30 | Disposition: A | Discharge status: Home / Self Care | Payer: BC Managed Care – PPO | Source: Ambulatory Visit | Attending: Family Medicine | Admitting: Family Medicine

## 2014-03-30 ENCOUNTER — Encounter: Payer: Self-pay | Admitting: Family Medicine

## 2014-03-30 ENCOUNTER — Ambulatory Visit (INDEPENDENT_AMBULATORY_CARE_PROVIDER_SITE_OTHER): Payer: BC Managed Care – PPO | Admitting: Family Medicine

## 2014-03-30 VITALS — BP 135/80 | HR 86 | Temp 98.0°F | Ht 73.5 in | Wt 308.5 lb

## 2014-03-30 DIAGNOSIS — M542 Cervicalgia: Secondary | ICD-10-CM

## 2014-03-30 DIAGNOSIS — I10 Essential (primary) hypertension: Secondary | ICD-10-CM

## 2014-03-30 DIAGNOSIS — E669 Obesity, unspecified: Secondary | ICD-10-CM

## 2014-03-30 DIAGNOSIS — E7800 Pure hypercholesterolemia, unspecified: Secondary | ICD-10-CM

## 2014-03-30 DIAGNOSIS — Z0000 Encounter for general adult medical examination without abnormal findings: Secondary | ICD-10-CM

## 2014-03-30 DIAGNOSIS — R7309 Other abnormal glucose: Secondary | ICD-10-CM

## 2014-03-30 MED ORDER — BACLOFEN 10 MG PO TABS: 10.0000 mg | ORAL_TABLET | Freq: Three times a day (TID) | ORAL | Status: DC | PRN

## 2014-03-30 NOTE — Progress Notes (Signed)
Pre visit review using our clinic review tool, if applicable. No additional management support is needed unless otherwise documented below in the visit note. 

## 2014-03-30 NOTE — Assessment & Plan Note (Signed)
Was tx at an UC prev with steroid/nsaid/muscle relaxer Symptoms returned with poss radiculopathy to L hand  xr CS today Baclofen px for prn use -warned of sedation/ pt has taken in the past

## 2014-03-30 NOTE — Assessment & Plan Note (Signed)
Disc goals for lipids and reasons to control them Rev labs with pt Rev low sat fat diet in detail   

## 2014-03-30 NOTE — Assessment & Plan Note (Signed)
Discussed how this problem influences overall health and the risks it imposes  Reviewed plan for weight loss with lower calorie diet (via better food choices and also portion control or program like weight watchers) and exercise building up to or more than 30 minutes 5 days per week including some aerobic activity    

## 2014-03-30 NOTE — Assessment & Plan Note (Signed)
Reviewed health habits including diet and exercise and skin cancer prevention Reviewed appropriate screening tests for age  Also reviewed health mt list, fam hx and immunization status , as well as social and family history   See HPI Labs rev Nl DRE

## 2014-03-30 NOTE — Assessment & Plan Note (Signed)
Lab Results  Component Value Date   HGBA1C 6.4 03/22/2014   Prediabetic Explained imp of wt loss and low sugar diet to prev DM No symptoms

## 2014-03-30 NOTE — Assessment & Plan Note (Signed)
bp in fair control at this time  BP Readings from Last 1 Encounters:  03/30/14 135/80   No changes needed Disc lifstyle change with low sodium diet and exercise   Lab rev  Enc wt loss

## 2014-03-30 NOTE — Progress Notes (Signed)
Subjective:    Patient ID: Charles Watkins, male    DOB: 04/07/1966, 48 y.o.   MRN: [id number]  HPI Here for health maintenance exam and to review chronic medical problems    He complains about left shoulder area /neck ( no injury that he knew of) Saw a doctor when he was on vaca - given prednisone and baclofen  Given ketorolac shot and dexamethasone shot  Got better for 3 weeks and then started back  Now his left 4th and 5th fingers feel a bit numb   Otherwise doing great  Wt is up 8 lb with bmi of 40 He has been "lazy" about exercise and diet  His wife is going to start a low carb diet with him  For exercise - he uses the ellptical at least 20 min 3 days per week   bp is up today-on first check   No cp or palpitations or headaches or edema  No side effects to medicines  BP Readings from Last 3 Encounters:  03/30/14 140/84  09/28/13 135/80  03/27/13 132/84     Chemistry      Component Value Date/Time   NA 137 03/22/2014 0844   K 4.0 03/22/2014 0844   CL 106 03/22/2014 0844   CO2 23 03/22/2014 0844   BUN 16 03/22/2014 0844   CREATININE 1.1 03/22/2014 0844      Component Value Date/Time   CALCIUM 9.0 03/22/2014 0844   ALKPHOS 50 03/22/2014 0844   AST 21 03/22/2014 0844   ALT 29 03/22/2014 0844   BILITOT 0.4 03/22/2014 0844         Had flu vaccine 1/15  Td 10/07  Hyperlipidemia Lab Results  Component Value Date   CHOL 186 03/22/2014   CHOL 202* 09/28/2013   CHOL 185 03/23/2013   Lab Results  Component Value Date   HDL 38.50* 03/22/2014   HDL 43.80 09/28/2013   HDL 39.70 03/23/2013   Lab Results  Component Value Date   LDLCALC 109* 03/22/2014   LDLCALC 106* 03/23/2013   LDLCALC 126* 07/24/2012   Lab Results  Component Value Date   TRIG 195.0* 03/22/2014   TRIG 161.0* 09/28/2013   TRIG 197.0* 03/23/2013   Lab Results  Component Value Date   CHOLHDL 5 03/22/2014   CHOLHDL 5 09/28/2013   CHOLHDL 5 03/23/2013   Lab Results  Component Value Date   LDLDIRECT  130.7 09/28/2013   LDLDIRECT 136.5 03/12/2012   LDLDIRECT 112.3 07/06/2010   LDL went down HDL went down Diet has not been optimal lately- too much eggs and bacon He continues fenofibrate (trig went up to 190s)   Hyperglycemia Lab Results  Component Value Date   HGBA1C 6.4 03/22/2014   this is up from 6.2 He knows he needs to cut sugar and also loose weight   Prostate history  No cancer in the family  No symptoms and no nocturia       Review of Systems Review of Systems  Constitutional: Negative for fever, appetite change, fatigue and unexpected weight change.  Eyes: Negative for pain and visual disturbance.  Respiratory: Negative for cough and shortness of breath.   Cardiovascular: Negative for cp or palpitations    Gastrointestinal: Negative for nausea, diarrhea and constipation.  Genitourinary: Negative for urgency and frequency.  Skin: Negative for pallor or rash   MSK pos for neck pain rad to L shoulder Neurological: Negative for weakness, light-headedness,  and headaches.  Hematological: Negative for adenopathy. Does  not bruise/bleed easily.  Psychiatric/Behavioral: Negative for dysphoric mood. The patient is not nervous/anxious.         Objective:   Physical Exam  Constitutional: He is oriented to person, place, and time. He appears well-developed and well-nourished. No distress.  obese and well appearing   HENT:  Head: Normocephalic and atraumatic.  Right Ear: External ear normal.  Left Ear: External ear normal.  Nose: Nose normal.  Mouth/Throat: Oropharynx is clear and moist.  Eyes: Conjunctivae and EOM are normal. Pupils are equal, round, and reactive to light. Right eye exhibits no discharge. Left eye exhibits no discharge. No scleral icterus.  Neck: Normal range of motion. Neck supple. No JVD present. Carotid bruit is not present. No thyromegaly present.  Cardiovascular: Normal rate, regular rhythm, normal heart sounds and intact distal pulses.  Exam  reveals no gallop.   Pulmonary/Chest: Effort normal and breath sounds normal. No respiratory distress. He has no wheezes. He has no rales.  Abdominal: Soft. Bowel sounds are normal. He exhibits no distension, no abdominal bruit and no mass. There is no tenderness.  Genitourinary: Prostate normal.  Prostate is firm/ nt and symmetric   Musculoskeletal: Normal range of motion. He exhibits tenderness. He exhibits no edema.  Tender in L paracervical musculature . No bony tenderness Nl flex and ext Uncomfortable to rotate and tilt head to the R  Lymphadenopathy:    He has no cervical adenopathy.  Neurological: He is alert and oriented to person, place, and time. He has normal reflexes. He displays no atrophy. No cranial nerve deficit or sensory deficit. He exhibits normal muscle tone. Coordination normal.  Skin: Skin is warm and dry. No rash noted. No erythema. No pallor.  Psychiatric: He has a normal mood and affect.          Assessment & Plan:   Problem List Items Addressed This Visit     Cardiovascular and Mediastinum   HYPERTENSION - Primary      bp in fair control at this time  BP Readings from Last 1 Encounters:  03/30/14 135/80   No changes needed Disc lifstyle change with low sodium diet and exercise   Lab rev  Enc wt loss       Other   HYPERCHOLESTEROLEMIA     Disc goals for lipids and reasons to control them Rev labs with pt Rev low sat fat diet in detail      HYPERGLYCEMIA      Lab Results  Component Value Date   HGBA1C 6.4 03/22/2014   Prediabetic Explained imp of wt loss and low sugar diet to prev DM No symptoms     Obesity     Discussed how this problem influences overall health and the risks it imposes  Reviewed plan for weight loss with lower calorie diet (via better food choices and also portion control or program like weight watchers) and exercise building up to or more than 30 minutes 5 days per week including some aerobic activity         Routine general medical examination at a health care facility     Reviewed health habits including diet and exercise and skin cancer prevention Reviewed appropriate screening tests for age  Also reviewed health mt list, fam hx and immunization status , as well as social and family history   See HPI Labs rev Nl DRE    Neck pain on left side     Was tx at an UC prev with steroid/nsaid/muscle relaxer Symptoms  returned with poss radiculopathy to L hand  xr CS today Baclofen px for prn use -warned of sedation/ pt has taken in the past     Relevant Orders      DG Cervical Spine Complete (Completed)

## 2014-03-30 NOTE — Patient Instructions (Signed)
Have [facility] contact me when you are ready for a refill  Xray of neck today Take the baclofen for neck/shoulder pain when needed  You are close to diabetes-so watch your diet for sugar and carbohydrates - keep exercising  Weight loss will decrease your risk of diabetes

## 2014-04-01 ENCOUNTER — Telehealth: Payer: Self-pay | Admitting: Family Medicine

## 2014-04-01 ENCOUNTER — Encounter: Payer: Self-pay | Admitting: Family Medicine

## 2014-04-01 DIAGNOSIS — M542 Cervicalgia: Secondary | ICD-10-CM

## 2014-04-01 NOTE — Telephone Encounter (Signed)
Ref to ortho for neck pain and deg change on CS xray

## 2014-05-02 ENCOUNTER — Other Ambulatory Visit: Payer: Self-pay | Admitting: Family Medicine

## 2014-06-10 ENCOUNTER — Other Ambulatory Visit: Payer: Self-pay | Admitting: Family Medicine

## 2014-09-24 ENCOUNTER — Other Ambulatory Visit: Payer: BC Managed Care – PPO

## 2014-10-01 ENCOUNTER — Ambulatory Visit: Payer: BC Managed Care – PPO | Admitting: Family Medicine

## 2014-10-29 ENCOUNTER — Other Ambulatory Visit: Payer: Self-pay | Admitting: Family Medicine

## 2014-11-14 ENCOUNTER — Other Ambulatory Visit: Payer: Self-pay | Admitting: Family Medicine

## 2015-01-08 ENCOUNTER — Other Ambulatory Visit: Payer: Self-pay | Admitting: Family Medicine

## 2015-01-10 NOTE — Telephone Encounter (Signed)
appt scheduled, pt had enough medication until his f/u appt., Rx declined and I advise pharmacy we will refill Rx at appt

## 2015-01-10 NOTE — Telephone Encounter (Signed)
Please schedule f/u and refill until then  

## 2015-01-10 NOTE — Telephone Encounter (Signed)
Electronic refill request, last OV was 03/31/15 and no future appt (pt cancelled his 6 month f/u appt that was scheduled in Jan.), please advise

## 2015-01-14 ENCOUNTER — Encounter: Payer: Self-pay | Admitting: Family Medicine

## 2015-01-14 ENCOUNTER — Ambulatory Visit (INDEPENDENT_AMBULATORY_CARE_PROVIDER_SITE_OTHER): Payer: BLUE CROSS/BLUE SHIELD | Admitting: Family Medicine

## 2015-01-14 VITALS — BP 126/82 | HR 88 | Temp 97.9°F | Ht 73.5 in | Wt 310.5 lb

## 2015-01-14 DIAGNOSIS — R739 Hyperglycemia, unspecified: Secondary | ICD-10-CM

## 2015-01-14 DIAGNOSIS — M25561 Pain in right knee: Secondary | ICD-10-CM

## 2015-01-14 DIAGNOSIS — I10 Essential (primary) hypertension: Secondary | ICD-10-CM

## 2015-01-14 DIAGNOSIS — E7800 Pure hypercholesterolemia, unspecified: Secondary | ICD-10-CM

## 2015-01-14 LAB — LIPID PANEL
Cholesterol: 187 mg/dL (ref 0–200)
HDL: 41.6 mg/dL (ref >39.00)
LDL Cholesterol: 123 mg/dL — ABNORMAL HIGH (ref 0–99)
NonHDL: 145.4
Total CHOL/HDL Ratio: 4
Triglycerides: 112 mg/dL (ref 0.0–149.0)
VLDL: 22.4 mg/dL (ref 0.0–40.0)

## 2015-01-14 LAB — HEMOGLOBIN A1C: Hgb A1c MFr Bld: 6.3 % (ref 4.6–6.5)

## 2015-01-14 NOTE — Assessment & Plan Note (Signed)
Pain is returning gradually since his arthroscopic surg and Baker's cyst removal  Will ref for f/u with Dr Sabra Heck  Pain is worse on extension and prolonged standing

## 2015-01-14 NOTE — Assessment & Plan Note (Signed)
Prediabetic due for A1C today  No success with wt loss so far -enc working on it  Rev low glycemic diet- has cut down on sweet tea-enc to keep doing that

## 2015-01-14 NOTE — Progress Notes (Signed)
Subjective:    Patient ID: Charles Watkins, male    DOB: [birth year], 49 y.o.   MRN: [id number]  HPI Here for f/u of chronic medical problems  Wt is up 2 lb with bmi of 40 Having trouble loosing weight - it used to be easier Plays a little basketball for exercise - needs to do something regularly  Needs to start working out at night  Thinks he has been eating healthy- avoiding fatty foods  No sweets / does drink sweet tea- he cut down to 2 cups per week -- not daily now     bp is stable today  No cp or palpitations or headaches or edema  No side effects to medicines  BP Readings from Last 3 Encounters:  01/14/15 126/82  03/30/14 135/80  09/28/13 135/80     Hx of hyperlipidemia /high triglycerides Lab Results  Component Value Date   CHOL 186 03/22/2014   HDL 38.50* 03/22/2014   LDLCALC 109* 03/22/2014   LDLDIRECT 130.7 09/28/2013   TRIG 195.0* 03/22/2014   CHOLHDL 5 03/22/2014    On lofibra- and no problems with that   Hx of hyperglyemia Lab Results  Component Value Date   HGBA1C 6.4 03/22/2014    Having some knee problems - R knee is stiff  Is tender posteriorly  Has had it scoped and had a baker's cyst  Pain to extend it  Feels unstable at times Worse after standing at work - getting worse with time   Patient Active Problem List   Diagnosis Date Noted  . Neck pain on left side 03/30/2014  . Routine general medical examination at a health care facility 03/22/2013  . Obesity 09/23/2012  . Breast pain in male 04/23/2012  . Hyperglycemia 08/05/2009  . Essential hypertension 08/14/2007  . ALLERGIC RHINITIS 08/14/2007  . BAKER'S CYST 08/14/2007  . SLEEP APNEA 08/14/2007  . HYPERCHOLESTEROLEMIA 01/16/2007  . ADVEF, DRUG/MEDICINAL/BIOLOGICAL SUBST NOS 01/16/2007   Past Medical History  Diagnosis Date  . Allergic rhinitis   . Hypertension   . Hyperlipidemia   . Hyperglycemia    Past Surgical History  Procedure Laterality Date  . Baker's cyst -  aspirated  03/2005   History  Substance Use Topics  . Smoking status: Former Smoker -- 0.10 packs/day    Types: Cigarettes    Quit date: 08/06/2011  . Smokeless tobacco: Not on file     Comment: non smoker  . Alcohol Use: 0.0 oz/week    0 Standard drinks or equivalent per week     Comment: drinks on weekend   Family History  Problem Relation Age of Onset  . Heart disease Mother   . Thyroid disease Mother   . Hypertension Mother   . Brain cancer Mother    No Known Allergies Current Outpatient Prescriptions on File Prior to Visit  Medication Sig Dispense Refill  . amLODipine (NORVASC) 10 MG tablet TAKE 1 TABLET DAILY 90 tablet 3  . aspirin 81 MG tablet Take 81 mg by mouth daily.      . baclofen (LIORESAL) 10 MG tablet Take 1 tablet (10 mg total) by mouth 3 (three) times daily as needed for muscle spasms (watch out for sedation). 60 each 0  . cetirizine (ZYRTEC) 10 MG tablet Take 10 mg by mouth every other day.     . esomeprazole (NEXIUM) 20 MG capsule Take 20 mg by mouth as needed.    . fenofibrate micronized (LOFIBRA) 134 MG capsule TAKE 1 CAPSULE DAILY  90 capsule 0  . Multiple Vitamin (MULTIVITAMIN) tablet Take 1 tablet by mouth daily.       No current facility-administered medications on file prior to visit.     Review of Systems    Review of Systems  Constitutional: Negative for fever, appetite change, fatigue and unexpected weight change.  Eyes: Negative for pain and visual disturbance.  Respiratory: Negative for cough and shortness of breath.   Cardiovascular: Negative for cp or palpitations    Gastrointestinal: Negative for nausea, diarrhea and constipation.  Genitourinary: Negative for urgency and frequency.  Skin: Negative for pallor or rash   MSK pos for R knee pain  Neurological: Negative for weakness, light-headedness, numbness and headaches.  Hematological: Negative for adenopathy. Does not bruise/bleed easily.  Psychiatric/Behavioral: Negative for dysphoric  mood. The patient is not nervous/anxious.      Objective:   Physical Exam  Constitutional: He appears well-developed and well-nourished. No distress.  obese and well appearing   HENT:  Head: Normocephalic and atraumatic.  Mouth/Throat: Oropharynx is clear and moist.  Eyes: Conjunctivae and EOM are normal. Pupils are equal, round, and reactive to light.  Neck: Normal range of motion. Neck supple. No JVD present. Carotid bruit is not present. No thyromegaly present.  Cardiovascular: Normal rate, regular rhythm, normal heart sounds and intact distal pulses.  Exam reveals no gallop.   Pulmonary/Chest: Effort normal and breath sounds normal. No respiratory distress. He has no wheezes. He has no rales.  No crackles  Abdominal: Soft. Bowel sounds are normal. He exhibits no distension, no abdominal bruit and no mass. There is no tenderness.  Musculoskeletal: He exhibits tenderness. He exhibits no edema.  L knee tender posteriorly and laterally  Nl rom -pain with full extension Stable -nl lachman/drawer exam slt lateral crepitus   No eff or swelling   Lymphadenopathy:    He has no cervical adenopathy.  Neurological: He is alert. He has normal reflexes.  Skin: Skin is warm and dry. No rash noted.  Psychiatric: He has a normal mood and affect.          Assessment & Plan:   Problem List Items Addressed This Visit    Essential hypertension - Primary    bp in fair control at this time  BP Readings from Last 1 Encounters:  01/14/15 126/82   No changes needed Disc lifstyle change with low sodium diet and exercise  Last labs rev Lipid check today Enc to work on wt loss       HYPERCHOLESTEROLEMIA    Lipid panel today Taking lofibra for high triglycerides - disc low fat diet in detail  Also exercise to raise HDL as tolerated       Relevant Orders   Lipid panel   Hyperglycemia    Prediabetic due for A1C today  No success with wt loss so far -enc working on it  Rev low glycemic  diet- has cut down on sweet tea-enc to keep doing that       Relevant Orders   Hemoglobin A1c   Right knee pain    Pain is returning gradually since his arthroscopic surg and Baker's cyst removal  Will ref for f/u with Dr Sabra Heck  Pain is worse on extension and prolonged standing       Relevant Orders   AMB referral to orthopedics

## 2015-01-14 NOTE — Assessment & Plan Note (Signed)
Lipid panel today Taking lofibra for high triglycerides - disc low fat diet in detail  Also exercise to raise HDL as tolerated

## 2015-01-14 NOTE — Assessment & Plan Note (Signed)
bp in fair control at this time  BP Readings from Last 1 Encounters:  01/14/15 126/82   No changes needed Disc lifstyle change with low sodium diet and exercise  Last labs rev Lipid check today Enc to work on wt loss

## 2015-01-14 NOTE — Progress Notes (Signed)
Pre visit review using our clinic review tool, if applicable. No additional management support is needed unless otherwise documented below in the visit note. 

## 2015-01-14 NOTE — Patient Instructions (Signed)
Lab today  Keep working on low sugar and low fat diet  Exercise as tolerated Stop at check out for orthopedic referral for knee Follow up in 6 months for annual exam

## 2015-08-03 ENCOUNTER — Telehealth: Payer: Self-pay | Admitting: Family Medicine

## 2015-08-03 DIAGNOSIS — R739 Hyperglycemia, unspecified: Secondary | ICD-10-CM

## 2015-08-03 DIAGNOSIS — Z125 Encounter for screening for malignant neoplasm of prostate: Secondary | ICD-10-CM | POA: Insufficient documentation

## 2015-08-03 DIAGNOSIS — Z0000 Encounter for general adult medical examination without abnormal findings: Secondary | ICD-10-CM

## 2015-08-03 NOTE — Telephone Encounter (Signed)
-----   Message from Ellamae Sia sent at 08/02/2015  2:44 PM EST ----- Regarding: Lab orders for Thursday, 12.1.16 Patient is scheduled for CPX labs, please order future labs, Thanks , Karna Christmas

## 2015-08-04 ENCOUNTER — Other Ambulatory Visit (INDEPENDENT_AMBULATORY_CARE_PROVIDER_SITE_OTHER): Payer: BLUE CROSS/BLUE SHIELD

## 2015-08-04 DIAGNOSIS — Z125 Encounter for screening for malignant neoplasm of prostate: Secondary | ICD-10-CM | POA: Diagnosis not present

## 2015-08-04 DIAGNOSIS — Z0000 Encounter for general adult medical examination without abnormal findings: Secondary | ICD-10-CM

## 2015-08-04 DIAGNOSIS — R739 Hyperglycemia, unspecified: Secondary | ICD-10-CM | POA: Diagnosis not present

## 2015-08-04 LAB — COMPREHENSIVE METABOLIC PANEL
ALT: 27 U/L (ref 0–53)
AST: 22 U/L (ref 0–37)
Albumin: 4.1 g/dL (ref 3.5–5.2)
Alkaline Phosphatase: 53 U/L (ref 39–117)
BUN: 17 mg/dL (ref 6–23)
CO2: 28 mEq/L (ref 19–32)
Calcium: 9.2 mg/dL (ref 8.4–10.5)
Chloride: 106 mEq/L (ref 96–112)
Creatinine, Ser: 1.27 mg/dL (ref 0.40–1.50)
GFR: 77.72 mL/min (ref >60.00)
Glucose, Bld: 103 mg/dL — ABNORMAL HIGH (ref 70–99)
Potassium: 4.1 mEq/L (ref 3.5–5.1)
Sodium: 140 mEq/L (ref 135–145)
Total Bilirubin: 0.4 mg/dL (ref 0.2–1.2)
Total Protein: 7.4 g/dL (ref 6.0–8.3)

## 2015-08-04 LAB — CBC WITH DIFFERENTIAL/PLATELET
Basophils Absolute: 0 10*3/uL (ref 0.0–0.1)
Basophils Relative: 0.4 % (ref 0.0–3.0)
Eosinophils Absolute: 0.3 10*3/uL (ref 0.0–0.7)
Eosinophils Relative: 4.4 % (ref 0.0–5.0)
HCT: 41.5 % (ref 39.0–52.0)
Hemoglobin: 13.8 g/dL (ref 13.0–17.0)
Lymphocytes Relative: 53.9 % — ABNORMAL HIGH (ref 12.0–46.0)
Lymphs Abs: 3.4 10*3/uL (ref 0.7–4.0)
MCHC: 33.4 g/dL (ref 30.0–36.0)
MCV: 88.3 fl (ref 78.0–100.0)
Monocytes Absolute: 0.7 10*3/uL (ref 0.1–1.0)
Monocytes Relative: 11.6 % (ref 3.0–12.0)
Neutro Abs: 1.9 10*3/uL (ref 1.4–7.7)
Neutrophils Relative %: 29.7 % — ABNORMAL LOW (ref 43.0–77.0)
Platelets: 298 10*3/uL (ref 150.0–400.0)
RBC: 4.7 Mil/uL (ref 4.22–5.81)
RDW: 13.9 % (ref 11.5–15.5)
WBC: 6.3 10*3/uL (ref 4.0–10.5)

## 2015-08-04 LAB — LIPID PANEL
Cholesterol: 197 mg/dL (ref 0–200)
HDL: 43 mg/dL
LDL Cholesterol: 122 mg/dL — ABNORMAL HIGH (ref 0–99)
NonHDL: 153.87
Total CHOL/HDL Ratio: 5
Triglycerides: 159 mg/dL — ABNORMAL HIGH (ref 0.0–149.0)
VLDL: 31.8 mg/dL (ref 0.0–40.0)

## 2015-08-04 LAB — TSH: TSH: 2.74 u[IU]/mL (ref 0.35–4.50)

## 2015-08-04 LAB — PSA: PSA: 0.43 ng/mL (ref 0.10–4.00)

## 2015-08-04 LAB — HEMOGLOBIN A1C: Hgb A1c MFr Bld: 6.3 % (ref 4.6–6.5)

## 2015-08-05 LAB — HM DIABETES EYE EXAM

## 2015-08-09 ENCOUNTER — Encounter: Payer: Self-pay | Admitting: Family Medicine

## 2015-08-09 ENCOUNTER — Ambulatory Visit (INDEPENDENT_AMBULATORY_CARE_PROVIDER_SITE_OTHER): Payer: BLUE CROSS/BLUE SHIELD | Admitting: Family Medicine

## 2015-08-09 VITALS — BP 132/82 | HR 86 | Temp 98.3°F | Ht 73.5 in | Wt 313.2 lb

## 2015-08-09 DIAGNOSIS — I10 Essential (primary) hypertension: Secondary | ICD-10-CM

## 2015-08-09 DIAGNOSIS — Z23 Encounter for immunization: Secondary | ICD-10-CM

## 2015-08-09 DIAGNOSIS — Z125 Encounter for screening for malignant neoplasm of prostate: Secondary | ICD-10-CM | POA: Diagnosis not present

## 2015-08-09 DIAGNOSIS — Z0000 Encounter for general adult medical examination without abnormal findings: Secondary | ICD-10-CM

## 2015-08-09 DIAGNOSIS — R739 Hyperglycemia, unspecified: Secondary | ICD-10-CM

## 2015-08-09 DIAGNOSIS — E7800 Pure hypercholesterolemia, unspecified: Secondary | ICD-10-CM

## 2015-08-09 DIAGNOSIS — Z114 Encounter for screening for human immunodeficiency virus [HIV]: Secondary | ICD-10-CM | POA: Insufficient documentation

## 2015-08-09 NOTE — Progress Notes (Signed)
Subjective:    Patient ID: Charles Watkins, male    DOB: February 02, 1966, 49 y.o.   MRN: [id number]  HPI Here for health maintenance exam and to review chronic medical problems    Staying busy - at work    [employer] is up 3 lb  bmi is 40-now in morbid obesity category Not taking great care of himself lately   Diet: - at time he tries not to eat fried foods/ fatty foods /high calorie - then he gets off track (convenience eating)  Exercise - working out 15 minutes before work- stretches and calesthenics (no cardio)  Thinks he needs to start working out on the way home from work  If he works out then he gets motivated not to snack and eat improperly    Flu shot today   HIV screen - is interested in   Td 10/07  Prostate screen No family hx  No trouble urinating /no nocturia  Lab Results  Component Value Date   PSA 0.43 08/04/2015     bp is stable today  No cp or palpitations or headaches or edema  No side effects to medicines  BP Readings from Last 3 Encounters:  08/09/15 132/82  01/14/15 126/82  03/30/14 135/80     Hyperglycemia Lab Results  Component Value Date   HGBA1C 6.3 08/04/2015   This is unchanged Avoiding sweet tea now  Does not eat sweets often - but when he does her overdoes it (very rare)    Hyperlipidemia On lofibra for triglycerides Lab Results  Component Value Date   CHOL 197 08/04/2015   CHOL 187 01/14/2015   CHOL 186 03/22/2014   Lab Results  Component Value Date   HDL 43.00 08/04/2015   HDL 41.60 01/14/2015   HDL 38.50* 03/22/2014   Lab Results  Component Value Date   LDLCALC 122* 08/04/2015   LDLCALC 123* 01/14/2015   LDLCALC 109* 03/22/2014   Lab Results  Component Value Date   TRIG 159.0* 08/04/2015   TRIG 112.0 01/14/2015   TRIG 195.0* 03/22/2014   Lab Results  Component Value Date   CHOLHDL 5 08/04/2015   CHOLHDL 4 01/14/2015   CHOLHDL 5 03/22/2014   Lab Results  Component Value Date   LDLDIRECT 130.7 09/28/2013   LDLDIRECT 136.5 03/12/2012   LDLDIRECT 112.3 07/06/2010    Taking lofibra -no side eff It is controlling triglycerides Fairly stable HDL is up / trig up slt but not extremely high     Chemistry      Component Value Date/Time   NA 140 08/04/2015 0815   K 4.1 08/04/2015 0815   CL 106 08/04/2015 0815   CO2 28 08/04/2015 0815   BUN 17 08/04/2015 0815   CREATININE 1.27 08/04/2015 0815      Component Value Date/Time   CALCIUM 9.2 08/04/2015 0815   ALKPHOS 53 08/04/2015 0815   AST 22 08/04/2015 0815   ALT 27 08/04/2015 0815   BILITOT 0.4 08/04/2015 0815       Review of Systems Review of Systems  Constitutional: Negative for fever, appetite change, fatigue and unexpected weight change.  Eyes: Negative for pain and visual disturbance.  Respiratory: Negative for cough and shortness of breath.   Cardiovascular: Negative for cp or palpitations    Gastrointestinal: Negative for nausea, diarrhea and constipation.  Genitourinary: Negative for urgency and frequency.  Skin: Negative for pallor or rash   Neurological: Negative for weakness, light-headedness, numbness and headaches.  Hematological: Negative for adenopathy.  Does not bruise/bleed easily.  Psychiatric/Behavioral: Negative for dysphoric mood. The patient is not nervous/anxious.         Objective:   Physical Exam  Constitutional: He appears well-developed and well-nourished. No distress.  Morbidly obese and well app  HENT:  Head: Normocephalic and atraumatic.  Right Ear: External ear normal.  Left Ear: External ear normal.  Nose: Nose normal.  Mouth/Throat: Oropharynx is clear and moist.  Baseline speech impediment   Eyes: Conjunctivae and EOM are normal. Pupils are equal, round, and reactive to light. Right eye exhibits no discharge. Left eye exhibits no discharge. No scleral icterus.  Neck: Normal range of motion. Neck supple. No JVD present. Carotid bruit is not present. No thyromegaly present.  Cardiovascular:  Normal rate, regular rhythm, normal heart sounds and intact distal pulses.  Exam reveals no gallop.   Pulmonary/Chest: Effort normal and breath sounds normal. No respiratory distress. He has no wheezes. He exhibits no tenderness.  Abdominal: Soft. Bowel sounds are normal. He exhibits no distension, no abdominal bruit and no mass. There is no tenderness.  Musculoskeletal: He exhibits no edema or tenderness.  Lymphadenopathy:    He has no cervical adenopathy.  Neurological: He is alert. He has normal reflexes. No cranial nerve deficit. He exhibits normal muscle tone. Coordination normal.  Skin: Skin is warm and dry. No rash noted. No erythema. No pallor.  Psychiatric: He has a normal mood and affect.  Nursing note and vitals reviewed.      Assessment & Plan:   Problem List Items Addressed This Visit      Cardiovascular and Mediastinum   Essential hypertension    bp in fair control at this time  BP Readings from Last 1 Encounters:  08/09/15 132/82   No changes needed Disc lifstyle change with low sodium diet and exercise  Labs reviewed         Other   HYPERCHOLESTEROLEMIA    Disc goals for lipids and reasons to control them Rev labs with pt Rev low sat fat diet in detail Trig are fairly controlled with lofibra LDL is fair        Hyperglycemia    Lab Results  Component Value Date   HGBA1C 6.3 08/04/2015   This is diet controlled Stressed imp of wt loss and low glycemic diet for DM prev F/u 64mo       Morbid obesity (HCC)    Discussed how this problem influences overall health and the risks it imposes  Reviewed plan for weight loss with lower calorie diet (via better food choices and also portion control or program like weight watchers) and exercise building up to or more than 30 minutes 5 days per week including some aerobic activity   Pt is ready to get serious about wt loss Plans to work out 5 d per week after work- has a gym to go to Also cut portions and stop  junk food       Prostate cancer screening    Lab Results  Component Value Date   PSA 0.43 08/04/2015   No family hx or symptoms Will continue to follow       Routine general medical examination at a health care facility - Primary    Reviewed health habits including diet and exercise and skin cancer prevention Reviewed appropriate screening tests for age  Also reviewed health mt list, fam hx and immunization status , as well as social and family history   See HPI Labs reviewed Flu  shot today  Lab today for HIV screening test  Take care of yourself  Try to eat better/ healthy foods/ less junk and keep avoiding sugar Aim for at least 30 minutes of exercise 5 days per week - your plan of doing it after work is great  Loosing weight will decrease your diabetes risk  Labs are stable       Screening for HIV (human immunodeficiency virus)    In low risk pt  HIV screen today      Relevant Orders   HIV antibody (with reflex) (Completed)    Other Visit Diagnoses    Need for influenza vaccination        Relevant Orders    Flu Vaccine QUAD 36+ mos PF IM (Fluarix & Fluzone Quad PF) (Completed)

## 2015-08-09 NOTE — Patient Instructions (Signed)
Flu shot today  Lab today for HIV screening test  Take care of yourself  Try to eat better/ healthy foods/ less junk and keep avoiding sugar Aim for at least 30 minutes of exercise 5 days per week - your plan of doing it after work is great  Loosing weight will decrease your diabetes risk  Labs are stable    Follow up with me in about 6 months with labs prior

## 2015-08-09 NOTE — Progress Notes (Signed)
Pre visit review using our clinic review tool, if applicable. No additional management support is needed unless otherwise documented below in the visit note. 

## 2015-08-10 LAB — HIV ANTIBODY (ROUTINE TESTING W REFLEX): HIV 1&2 Ab, 4th Generation: NONREACTIVE

## 2015-08-10 NOTE — Assessment & Plan Note (Signed)
Lab Results  Component Value Date   HGBA1C 6.3 08/04/2015   This is diet controlled Stressed imp of wt loss and low glycemic diet for DM prev F/u 43mo

## 2015-08-10 NOTE — Assessment & Plan Note (Signed)
Lab Results  Component Value Date   PSA 0.43 08/04/2015   No family hx or symptoms Will continue to follow

## 2015-08-10 NOTE — Assessment & Plan Note (Signed)
In low risk pt  HIV screen today

## 2015-08-10 NOTE — Assessment & Plan Note (Signed)
bp in fair control at this time  BP Readings from Last 1 Encounters:  08/09/15 132/82   No changes needed Disc lifstyle change with low sodium diet and exercise  Labs reviewed

## 2015-08-10 NOTE — Assessment & Plan Note (Signed)
Reviewed health habits including diet and exercise and skin cancer prevention Reviewed appropriate screening tests for age  Also reviewed health mt list, fam hx and immunization status , as well as social and family history   See HPI Labs reviewed Flu shot today  Lab today for HIV screening test  Take care of yourself  Try to eat better/ healthy foods/ less junk and keep avoiding sugar Aim for at least 30 minutes of exercise 5 days per week - your plan of doing it after work is great  Loosing weight will decrease your diabetes risk  Labs are stable

## 2015-08-10 NOTE — Assessment & Plan Note (Signed)
Discussed how this problem influences overall health and the risks it imposes  Reviewed plan for weight loss with lower calorie diet (via better food choices and also portion control or program like weight watchers) and exercise building up to or more than 30 minutes 5 days per week including some aerobic activity   Pt is ready to get serious about wt loss Plans to work out 5 d per week after work- has a gym to go to Also cut portions and stop junk food

## 2015-08-10 NOTE — Assessment & Plan Note (Signed)
Disc goals for lipids and reasons to control them Rev labs with pt Rev low sat fat diet in detail Trig are fairly controlled with lofibra LDL is fair

## 2015-09-01 ENCOUNTER — Other Ambulatory Visit: Payer: Self-pay | Admitting: Family Medicine

## 2015-09-01 MED ORDER — FENOFIBRATE MICRONIZED 134 MG PO CAPS: 134.0000 mg | ORAL_CAPSULE | Freq: Every day | ORAL | Status: DC

## 2015-11-08 ENCOUNTER — Other Ambulatory Visit: Payer: Self-pay | Admitting: Family Medicine

## 2016-01-31 ENCOUNTER — Other Ambulatory Visit (INDEPENDENT_AMBULATORY_CARE_PROVIDER_SITE_OTHER): Payer: BLUE CROSS/BLUE SHIELD

## 2016-01-31 DIAGNOSIS — R739 Hyperglycemia, unspecified: Secondary | ICD-10-CM | POA: Diagnosis not present

## 2016-01-31 LAB — HEMOGLOBIN A1C: Hgb A1c MFr Bld: 6.5 % (ref 4.6–6.5)

## 2016-02-07 ENCOUNTER — Encounter: Payer: Self-pay | Admitting: Family Medicine

## 2016-02-07 ENCOUNTER — Ambulatory Visit (INDEPENDENT_AMBULATORY_CARE_PROVIDER_SITE_OTHER): Payer: BLUE CROSS/BLUE SHIELD | Admitting: Family Medicine

## 2016-02-07 VITALS — BP 128/76 | HR 85 | Temp 98.1°F | Ht 73.5 in | Wt 314.5 lb

## 2016-02-07 DIAGNOSIS — E119 Type 2 diabetes mellitus without complications: Secondary | ICD-10-CM | POA: Diagnosis not present

## 2016-02-07 DIAGNOSIS — I10 Essential (primary) hypertension: Secondary | ICD-10-CM

## 2016-02-07 NOTE — Patient Instructions (Signed)
Keep working on exercise - work up to 5 days a week or more and increase intensity as tolerated  Take care of yourself  Try to control intake of sugar and carbohydrates (bread/pasta/potato/rice/fruit)  Keep track of your portions Stop at check out for referral to diabetic teaching  Follow up for annual exam with labs prior in 6 months

## 2016-02-07 NOTE — Progress Notes (Signed)
Pre visit review using our clinic review tool, if applicable. No additional management support is needed unless otherwise documented below in the visit note. 

## 2016-02-07 NOTE — Assessment & Plan Note (Signed)
bp in fair control at this time  BP Readings from Last 1 Encounters:  02/07/16 128/76   No changes needed Disc lifstyle change with low sodium diet and exercise  Labs reviewed Some improvement with exercise  Wt loss enc

## 2016-02-07 NOTE — Progress Notes (Signed)
Subjective:    Patient ID: Charles Watkins, male    DOB: Oct 04, 1965, 50 y.o.   MRN: [id number]  HPI Here for f/u of chronic health problems   Overall feels pretty good  Last few months - ? Not as good   Trying to take care of himself  3 months - using elliptical machine for 20 minutes (still on first level)  Uses it 3 days per week  Also does yard work every weekend / push [employer] he is eating ok  Avoids fried foods  Still has a "problem" with sweet tea - drinks one per day max (about 2-3 times per week)  Not a lot of sweets  Loves pasta  Does not eat a lot of bread   Wt is up 1 lb with bmi of 40.9 Morbid obese range  bp is stable today (exercise helps) No cp or palpitations or headaches or edema  No side effects to medicines  BP Readings from Last 3 Encounters:  02/07/16 128/76  08/09/15 132/82  01/14/15 126/82     Hyperglycemia Lab Results  Component Value Date   HGBA1C 6.5 01/31/2016  is interested in diabetic teaching   this is up from 6.3 last time  Not on any oral hypoglycemics  Last eye exam- was Dec 2016 -no problems but wears glasses now    Patient Active Problem List   Diagnosis Date Noted  . Screening for HIV (human immunodeficiency virus) 08/09/2015  . Prostate cancer screening 08/03/2015  . Right knee pain 01/14/2015  . Neck pain on left side 03/30/2014  . Routine general medical examination at a health care facility 03/22/2013  . Morbid obesity ([place]) 09/23/2012  . Breast pain in male 04/23/2012  . Diabetes type 2, controlled ([place]) 08/05/2009  . Essential hypertension 08/14/2007  . ALLERGIC RHINITIS 08/14/2007  . BAKER'S CYST 08/14/2007  . SLEEP APNEA 08/14/2007  . HYPERCHOLESTEROLEMIA 01/16/2007  . ADVEF, DRUG/MEDICINAL/BIOLOGICAL SUBST NOS 01/16/2007   Past Medical History  Diagnosis Date  . Allergic rhinitis   . Hypertension   . Hyperlipidemia   . Hyperglycemia    Past Surgical History  Procedure Laterality Date  . Baker's  cyst - aspirated  03/2005   Social History  Substance Use Topics  . Smoking status: Former Smoker -- 0.10 packs/day    Types: Cigarettes    Quit date: 08/06/2011  . Smokeless tobacco: None     Comment: non smoker  . Alcohol Use: 0.0 oz/week    0 Standard drinks or equivalent per week     Comment: drinks on weekend   Family History  Problem Relation Age of Onset  . Heart disease Mother   . Thyroid disease Mother   . Hypertension Mother   . Brain cancer Mother    No Known Allergies Current Outpatient Prescriptions on File Prior to Visit  Medication Sig Dispense Refill  . amLODipine (NORVASC) 10 MG tablet TAKE 1 TABLET DAILY 90 tablet 1  . aspirin 81 MG tablet Take 81 mg by mouth daily.      . cetirizine (ZYRTEC) 10 MG tablet Take 10 mg by mouth every other day.     . esomeprazole (NEXIUM) 20 MG capsule Take 20 mg by mouth as needed.    . fenofibrate micronized (LOFIBRA) 134 MG capsule Take 1 capsule (134 mg total) by mouth daily. 90 capsule 3  . Multiple Vitamin (MULTIVITAMIN) tablet Take 1 tablet by mouth daily.       No current  facility-administered medications on file prior to visit.    Review of Systems Review of Systems  Constitutional: Negative for fever, appetite change, fatigue and unexpected weight change.  Eyes: Negative for pain and visual disturbance.  Respiratory: Negative for cough and shortness of breath.   Cardiovascular: Negative for cp or palpitations    Gastrointestinal: Negative for nausea, diarrhea and constipation.  Genitourinary: Negative for urgency and frequency. neg for excessive thirst  Skin: Negative for pallor or rash   Neurological: Negative for weakness, light-headedness, numbness and headaches.  Hematological: Negative for adenopathy. Does not bruise/bleed easily.  Psychiatric/Behavioral: Negative for dysphoric mood. The patient is not nervous/anxious.         Objective:   Physical Exam  Constitutional: He appears well-developed and  well-nourished. No distress.  obese and well appearing Also very large build   HENT:  Head: Normocephalic and atraumatic.  Mouth/Throat: Oropharynx is clear and moist.  Eyes: Conjunctivae and EOM are normal. Pupils are equal, round, and reactive to light.  Neck: Normal range of motion. Neck supple. No JVD present. Carotid bruit is not present. No thyromegaly present.  Cardiovascular: Normal rate, regular rhythm, normal heart sounds and intact distal pulses.  Exam reveals no gallop.   Pulmonary/Chest: Effort normal and breath sounds normal. No respiratory distress. He has no wheezes. He has no rales.  No crackles  Abdominal: Soft. Bowel sounds are normal. He exhibits no distension, no abdominal bruit and no mass. There is no tenderness.  Musculoskeletal: He exhibits no edema.  Lymphadenopathy:    He has no cervical adenopathy.  Neurological: He is alert. He has normal reflexes.  Skin: Skin is warm and dry. No rash noted. No pallor.  Psychiatric: He has a normal mood and affect.          Assessment & Plan:   Problem List Items Addressed This Visit      Cardiovascular and Mediastinum   Essential hypertension    bp in fair control at this time  BP Readings from Last 1 Encounters:  02/07/16 128/76   No changes needed Disc lifstyle change with low sodium diet and exercise  Labs reviewed Some improvement with exercise  Wt loss enc        Endocrine   Diabetes type 2, controlled ([place]) - Primary    Lab Results  Component Value Date   HGBA1C 6.5 01/31/2016   This is up from 6.3-now in the DM 2 range Also morbid obesity/ HTN and lipidemia  Ref to DM teaching/ed - to learn how to organize meals and exercise Enc wt loss  Disc carb and total calorie reduction Will gradually inc exercise to 5 d per week and inc intensity as tolerated  F/u 24mo       Relevant Orders   Ambulatory referral to diabetic education     Other   Morbid obesity ([place])    He is struggling trying to  loose wt  Discussed how this problem influences overall health and the risks it imposes  Reviewed plan for weight loss with lower calorie diet (via better food choices and also portion control or program like weight watchers) and exercise building up to or more than 30 minutes 5 days per week including some aerobic activity   Will inc exercise to 5 d per wk and inc intensity Also disc portion control  Also was ref to DM ed

## 2016-02-07 NOTE — Assessment & Plan Note (Signed)
Lab Results  Component Value Date   HGBA1C 6.5 01/31/2016   This is up from 6.3-now in the DM 2 range Also morbid obesity/ HTN and lipidemia  Ref to DM teaching/ed - to learn how to organize meals and exercise Enc wt loss  Disc carb and total calorie reduction Will gradually inc exercise to 5 d per week and inc intensity as tolerated  F/u 79mo

## 2016-02-07 NOTE — Assessment & Plan Note (Signed)
He is struggling trying to loose wt  Discussed how this problem influences overall health and the risks it imposes  Reviewed plan for weight loss with lower calorie diet (via better food choices and also portion control or program like weight watchers) and exercise building up to or more than 30 minutes 5 days per week including some aerobic activity   Will inc exercise to 5 d per wk and inc intensity Also disc portion control  Also was ref to DM ed

## 2016-04-18 DIAGNOSIS — E119 Type 2 diabetes mellitus without complications: Secondary | ICD-10-CM | POA: Diagnosis not present

## 2016-05-06 ENCOUNTER — Other Ambulatory Visit: Payer: Self-pay | Admitting: Family Medicine

## 2016-08-05 ENCOUNTER — Telehealth: Payer: Self-pay | Admitting: Family Medicine

## 2016-08-05 DIAGNOSIS — Z0000 Encounter for general adult medical examination without abnormal findings: Secondary | ICD-10-CM

## 2016-08-05 DIAGNOSIS — Z125 Encounter for screening for malignant neoplasm of prostate: Secondary | ICD-10-CM

## 2016-08-05 DIAGNOSIS — E119 Type 2 diabetes mellitus without complications: Secondary | ICD-10-CM

## 2016-08-05 NOTE — Telephone Encounter (Signed)
-----   Message from Ellamae Sia sent at 08/02/2016  9:52 AM EST ----- Regarding: Lab orders for Friday, 12.8.17 Patient is scheduled for CPX labs, please order future labs, Thanks , Karna Christmas

## 2016-08-10 ENCOUNTER — Other Ambulatory Visit: Payer: BLUE CROSS/BLUE SHIELD

## 2016-08-10 ENCOUNTER — Other Ambulatory Visit (INDEPENDENT_AMBULATORY_CARE_PROVIDER_SITE_OTHER): Payer: BLUE CROSS/BLUE SHIELD

## 2016-08-10 DIAGNOSIS — Z125 Encounter for screening for malignant neoplasm of prostate: Secondary | ICD-10-CM | POA: Diagnosis not present

## 2016-08-10 DIAGNOSIS — R7989 Other specified abnormal findings of blood chemistry: Secondary | ICD-10-CM

## 2016-08-10 DIAGNOSIS — Z0000 Encounter for general adult medical examination without abnormal findings: Secondary | ICD-10-CM

## 2016-08-10 DIAGNOSIS — E119 Type 2 diabetes mellitus without complications: Secondary | ICD-10-CM | POA: Diagnosis not present

## 2016-08-10 LAB — LIPID PANEL
Cholesterol: 189 mg/dL (ref 0–200)
HDL: 38.1 mg/dL — ABNORMAL LOW (ref >39.00)
NonHDL: 150.61
Total CHOL/HDL Ratio: 5
Triglycerides: 211 mg/dL — ABNORMAL HIGH (ref 0.0–149.0)
VLDL: 42.2 mg/dL — ABNORMAL HIGH (ref 0.0–40.0)

## 2016-08-10 LAB — COMPREHENSIVE METABOLIC PANEL
ALT: 31 U/L (ref 0–53)
AST: 23 U/L (ref 0–37)
Albumin: 4.1 g/dL (ref 3.5–5.2)
Alkaline Phosphatase: 56 U/L (ref 39–117)
BUN: 14 mg/dL (ref 6–23)
CO2: 29 mEq/L (ref 19–32)
Calcium: 9.2 mg/dL (ref 8.4–10.5)
Chloride: 107 mEq/L (ref 96–112)
Creatinine, Ser: 1.17 mg/dL (ref 0.40–1.50)
GFR: 85.08 mL/min (ref >60.00)
Glucose, Bld: 122 mg/dL — ABNORMAL HIGH (ref 70–99)
Potassium: 4.2 mEq/L (ref 3.5–5.1)
Sodium: 141 mEq/L (ref 135–145)
Total Bilirubin: 0.3 mg/dL (ref 0.2–1.2)
Total Protein: 7.3 g/dL (ref 6.0–8.3)

## 2016-08-10 LAB — CBC WITH DIFFERENTIAL/PLATELET
Basophils Absolute: 0 10*3/uL (ref 0.0–0.1)
Basophils Relative: 0.7 % (ref 0.0–3.0)
Eosinophils Absolute: 0.3 10*3/uL (ref 0.0–0.7)
Eosinophils Relative: 6.6 % — ABNORMAL HIGH (ref 0.0–5.0)
HCT: 40.5 % (ref 39.0–52.0)
Hemoglobin: 13.8 g/dL (ref 13.0–17.0)
Lymphocytes Relative: 53.5 % — ABNORMAL HIGH (ref 12.0–46.0)
Lymphs Abs: 2.6 10*3/uL (ref 0.7–4.0)
MCHC: 34 g/dL (ref 30.0–36.0)
MCV: 88.4 fl (ref 78.0–100.0)
Monocytes Absolute: 0.7 10*3/uL (ref 0.1–1.0)
Monocytes Relative: 13.6 % — ABNORMAL HIGH (ref 3.0–12.0)
Neutro Abs: 1.2 10*3/uL — ABNORMAL LOW (ref 1.4–7.7)
Neutrophils Relative %: 25.6 % — ABNORMAL LOW (ref 43.0–77.0)
Platelets: 308 10*3/uL (ref 150.0–400.0)
RBC: 4.58 Mil/uL (ref 4.22–5.81)
RDW: 13.6 % (ref 11.5–15.5)
WBC: 4.9 10*3/uL (ref 4.0–10.5)

## 2016-08-10 LAB — TSH: TSH: 2.64 u[IU]/mL (ref 0.35–4.50)

## 2016-08-10 LAB — PSA: PSA: 1.5 ng/mL (ref 0.10–4.00)

## 2016-08-10 LAB — HEMOGLOBIN A1C: Hgb A1c MFr Bld: 6.4 % (ref 4.6–6.5)

## 2016-08-10 LAB — LDL CHOLESTEROL, DIRECT: Direct LDL: 117 mg/dL

## 2016-08-15 ENCOUNTER — Ambulatory Visit (INDEPENDENT_AMBULATORY_CARE_PROVIDER_SITE_OTHER): Payer: BLUE CROSS/BLUE SHIELD | Admitting: Family Medicine

## 2016-08-15 ENCOUNTER — Encounter: Payer: Self-pay | Admitting: Family Medicine

## 2016-08-15 VITALS — BP 128/66 | HR 82 | Temp 98.4°F | Ht 73.5 in | Wt 318.5 lb

## 2016-08-15 DIAGNOSIS — E7800 Pure hypercholesterolemia, unspecified: Secondary | ICD-10-CM

## 2016-08-15 DIAGNOSIS — Z23 Encounter for immunization: Secondary | ICD-10-CM | POA: Diagnosis not present

## 2016-08-15 DIAGNOSIS — Z0000 Encounter for general adult medical examination without abnormal findings: Secondary | ICD-10-CM | POA: Diagnosis not present

## 2016-08-15 DIAGNOSIS — R739 Hyperglycemia, unspecified: Secondary | ICD-10-CM | POA: Diagnosis not present

## 2016-08-15 DIAGNOSIS — Z125 Encounter for screening for malignant neoplasm of prostate: Secondary | ICD-10-CM

## 2016-08-15 DIAGNOSIS — I10 Essential (primary) hypertension: Secondary | ICD-10-CM

## 2016-08-15 MED ORDER — AMLODIPINE BESYLATE 10 MG PO TABS: 10.0000 mg | ORAL_TABLET | Freq: Every day | ORAL | 3 refills | Status: DC

## 2016-08-15 MED ORDER — TETANUS-DIPHTH-ACELL PERTUSSIS 5-2.5-18.5 LF-MCG/0.5 IM SUSP
0.5000 mL | Freq: Once | INTRAMUSCULAR | Status: AC
  Administered 2016-08-15: 0.5 mL via INTRAMUSCULAR

## 2016-08-15 MED ORDER — FENOFIBRATE MICRONIZED 134 MG PO CAPS: 134.0000 mg | ORAL_CAPSULE | Freq: Every day | ORAL | 3 refills | Status: DC

## 2016-08-15 NOTE — Assessment & Plan Note (Signed)
Lab Results  Component Value Date   HGBA1C 6.4 08/10/2016   This is improved Now in hyperglycemia range  Enc low glycemic eating and wt loss

## 2016-08-15 NOTE — Assessment & Plan Note (Signed)
Reviewed health habits including diet and exercise and skin cancer prevention Reviewed appropriate screening tests for age  Also reviewed health mt list, fam hx and immunization status , as well as social and family history   See HPI Labs reviewed Flu shot today  TDap today Improved A1C Disc plan for exercise and wt loss He will schedule his own eye exam  No change in medicines  Will watch psa carefully-no symptoms

## 2016-08-15 NOTE — Assessment & Plan Note (Signed)
Lab Results  Component Value Date   PSA 1.50 08/10/2016   PSA 0.43 08/04/2015   No bph symptoms Nl DRE today  Will watch closely due to inc from last year

## 2016-08-15 NOTE — Assessment & Plan Note (Signed)
bp in fair control at this time  BP Readings from Last 1 Encounters:  08/15/16 128/66   No changes needed Disc lifstyle change with low sodium diet and exercise  Labs rev Wt loss enc

## 2016-08-15 NOTE — Progress Notes (Signed)
Pre visit review using our clinic review tool, if applicable. No additional management support is needed unless otherwise documented below in the visit note. 

## 2016-08-15 NOTE — Progress Notes (Signed)
Subjective:    Patient ID: Charles Watkins, male    DOB: May 10, 1966, 50 y.o.   MRN: [id number]  HPI Here for health maintenance exam and to review chronic medical problems    Has been feeling good  Trying a different shift 2 to 10:30 Hard to fall asleep when he gets home   Wt Readings from Last 3 Encounters:  08/15/16 (!) 318 lb 8 oz (144.5 kg)  02/07/16 (!) 314 lb 8 oz (142.7 kg)  08/09/15 (!) 313 lb 4 oz (142.1 kg)  thought he was eating healthy  Not a lot of exercise (in addn to work)- he is about to start a yoga program  Has an active job as well- walks all day  bmi is 41.4  PNA vaccine-will do later since he needs tetanus shot and flu shot today   Flu shot -had today while here  Tetanus shot = due for that also   Eye exam 12/16- he will schedule on a weekend day   HIV screen 12/16  Prostate cancer screening Lab Results  Component Value Date   PSA 1.50 08/10/2016   PSA 0.43 08/04/2015   no prostate cancer in the family  No urinary symptoms at all -no frequency or nocturia    bp is stable today  No cp or palpitations or headaches or edema  No side effects to medicines  BP Readings from Last 3 Encounters:  08/15/16 128/66  02/07/16 128/76  08/09/15 132/82      DM2 Diet controlled Lab Results  Component Value Date   HGBA1C 6.4 08/10/2016  only drinks sweet tea once per month  Avoid sweets as a rule  Likes pasta -will work on smaller servings  Plans to start more exercise  This is down from 6.5-now in the hyperglycemia range  Eye exam due this month  Hx of hyperlipidemia Lab Results  Component Value Date   CHOL 189 08/10/2016   CHOL 197 08/04/2015   CHOL 187 01/14/2015   Lab Results  Component Value Date   HDL 38.10 (L) 08/10/2016   HDL 43.00 08/04/2015   HDL 41.60 01/14/2015   Lab Results  Component Value Date   LDLCALC 122 (H) 08/04/2015   LDLCALC 123 (H) 01/14/2015   LDLCALC 109 (H) 03/22/2014   Lab Results  Component Value Date     TRIG 211.0 (H) 08/10/2016   TRIG 159.0 (H) 08/04/2015   TRIG 112.0 01/14/2015   Lab Results  Component Value Date   CHOLHDL 5 08/10/2016   CHOLHDL 5 08/04/2015   CHOLHDL 4 01/14/2015   Lab Results  Component Value Date   LDLDIRECT 117.0 08/10/2016   LDLDIRECT 130.7 09/28/2013   LDLDIRECT 136.5 03/12/2012   On fenofibrate - controlled trig quite well  Tries not to eat a lot of fatty food  Likes bacon - will cut it down  Some fast food  Will try to pack lunch   Lab Results  Component Value Date   WBC 4.9 08/10/2016   HGB 13.8 08/10/2016   HCT 40.5 08/10/2016   MCV 88.4 08/10/2016   PLT 308.0 08/10/2016     Chemistry      Component Value Date/Time   NA 141 08/10/2016 0806   K 4.2 08/10/2016 0806   CL 107 08/10/2016 0806   CO2 29 08/10/2016 0806   BUN 14 08/10/2016 0806   CREATININE 1.17 08/10/2016 0806      Component Value Date/Time   CALCIUM 9.2 08/10/2016 0806  ALKPHOS 56 08/10/2016 0806   AST 23 08/10/2016 0806   ALT 31 08/10/2016 0806   BILITOT 0.3 08/10/2016 0806      Lab Results  Component Value Date   TSH 2.64 08/10/2016      Patient Active Problem List   Diagnosis Date Noted  . Screening for HIV (human immunodeficiency virus) 08/09/2015  . Prostate cancer screening 08/03/2015  . Right knee pain 01/14/2015  . Neck pain on left side 03/30/2014  . Routine general medical examination at a health care facility 03/22/2013  . Morbid obesity ([place]) 09/23/2012  . Breast pain in male 04/23/2012  . Hyperglycemia 08/05/2009  . Essential hypertension 08/14/2007  . ALLERGIC RHINITIS 08/14/2007  . BAKER'S CYST 08/14/2007  . SLEEP APNEA 08/14/2007  . HYPERCHOLESTEROLEMIA 01/16/2007  . ADVEF, DRUG/MEDICINAL/BIOLOGICAL SUBST NOS 01/16/2007   Past Medical History:  Diagnosis Date  . Allergic rhinitis   . Hyperglycemia   . Hyperlipidemia   . Hypertension    Past Surgical History:  Procedure Laterality Date  . Baker's cyst - aspirated  03/2005    Social History  Substance Use Topics  . Smoking status: Former Smoker    Packs/day: 0.10    Types: Cigarettes    Quit date: 08/06/2011  . Smokeless tobacco: Never Used     Comment: non smoker  . Alcohol use 0.0 oz/week     Comment: drinks on weekend   Family History  Problem Relation Age of Onset  . Heart disease Mother   . Thyroid disease Mother   . Hypertension Mother   . Brain cancer Mother    No Known Allergies Current Outpatient Prescriptions on File Prior to Visit  Medication Sig Dispense Refill  . aspirin 81 MG tablet Take 81 mg by mouth daily.      . cetirizine (ZYRTEC) 10 MG tablet Take 10 mg by mouth every other day.     . esomeprazole (NEXIUM) 20 MG capsule Take 20 mg by mouth as needed.    . Multiple Vitamin (MULTIVITAMIN) tablet Take 1 tablet by mouth daily.       No current facility-administered medications on file prior to visit.     Review of Systems Review of Systems  Constitutional: Negative for fever, appetite change, fatigue and unexpected weight change.  Eyes: Negative for pain and visual disturbance.  Respiratory: Negative for cough and shortness of breath.   Cardiovascular: Negative for cp or palpitations    Gastrointestinal: Negative for nausea, diarrhea and constipation.  Genitourinary: Negative for urgency and frequency. neg for nocturia  Skin: Negative for pallor or rash   MSK pos for occ swelling in R ankle (the leg he had bakers cyst in previously) Neurological: Negative for weakness, light-headedness, numbness and headaches.  Hematological: Negative for adenopathy. Does not bruise/bleed easily.  Psychiatric/Behavioral: Negative for dysphoric mood. The patient is not nervous/anxious.         Objective:   Physical Exam  Constitutional: He is oriented to person, place, and time. He appears well-developed and well-nourished. No distress.  obese and well appearing   HENT:  Head: Normocephalic and atraumatic.  Right Ear: External ear  normal.  Left Ear: External ear normal.  Nose: Nose normal.  Mouth/Throat: Oropharynx is clear and moist.  Eyes: Conjunctivae and EOM are normal. Pupils are equal, round, and reactive to light. Right eye exhibits no discharge. Left eye exhibits no discharge. No scleral icterus.  Neck: Normal range of motion. Neck supple. No JVD present. Carotid bruit is not present.  No thyromegaly present.  Cardiovascular: Normal rate, regular rhythm, normal heart sounds and intact distal pulses.  Exam reveals no gallop.   Pulmonary/Chest: Effort normal and breath sounds normal. No respiratory distress. He has no wheezes. He has no rales.  Abdominal: Soft. Bowel sounds are normal. He exhibits no distension, no abdominal bruit and no mass. There is no tenderness.  Genitourinary: Rectum normal and prostate normal. Rectal exam shows no mass, no tenderness and anal tone normal. Prostate is not enlarged and not tender.  Genitourinary Comments: Prostate is nl in size and shape/ symmetric firm and nt  Musculoskeletal: Normal range of motion. He exhibits no edema or tenderness.  Lymphadenopathy:    He has no cervical adenopathy.  Neurological: He is alert and oriented to person, place, and time. He has normal reflexes. No cranial nerve deficit. He exhibits normal muscle tone. Coordination normal.  Skin: Skin is warm and dry. No rash noted. No erythema. No pallor.  Skin tags on neck and trunk  Psychiatric: He has a normal mood and affect.          Assessment & Plan:   Problem List Items Addressed This Visit      Cardiovascular and Mediastinum   Essential hypertension    bp in fair control at this time  BP Readings from Last 1 Encounters:  08/15/16 128/66   No changes needed Disc lifstyle change with low sodium diet and exercise  Labs rev Wt loss enc       Relevant Medications   amLODipine (NORVASC) 10 MG tablet   fenofibrate micronized (LOFIBRA) 134 MG capsule     Other   Routine general medical  examination at a health care facility    Reviewed health habits including diet and exercise and skin cancer prevention Reviewed appropriate screening tests for age  Also reviewed health mt list, fam hx and immunization status , as well as social and family history   See HPI Labs reviewed Flu shot today  TDap today Improved A1C Disc plan for exercise and wt loss He will schedule his own eye exam  No change in medicines  Will watch psa carefully-no symptoms      Prostate cancer screening    Lab Results  Component Value Date   PSA 1.50 08/10/2016   PSA 0.43 08/04/2015   No bph symptoms Nl DRE today  Will watch closely due to inc from last year       Morbid obesity ([place])    Very muscular build sways bmi as well Discussed how this problem influences overall health and the risks it imposes  Reviewed plan for weight loss with lower calorie diet (via better food choices and also portion control or program like weight watchers) and exercise building up to or more than 30 minutes 5 days per week including some aerobic activity   Will start exercise      Hyperglycemia    Lab Results  Component Value Date   HGBA1C 6.4 08/10/2016   This is improved Now in hyperglycemia range  Enc low glycemic eating and wt loss       HYPERCHOLESTEROLEMIA    Disc goals for lipids and reasons to control them Rev labs with pt Rev low sat fat diet in detail On fenofibrate-controlling triglycerides Will cut out fast food and bacon to get it down further  Goals for LDL is under 100  Would rather not mix statin unless necessary      Relevant Medications   amLODipine (NORVASC)  10 MG tablet   fenofibrate micronized (LOFIBRA) 134 MG capsule    Other Visit Diagnoses    Need for influenza vaccination    -  Primary   Relevant Orders   Flu Vaccine QUAD 36+ mos IM (Completed)   Need for Tdap vaccination       Relevant Medications   Tdap (BOOSTRIX) injection 0.5 mL (Completed)

## 2016-08-15 NOTE — Patient Instructions (Addendum)
Don't forget to schedule an annual eye exam  Your A1C is down a little - keep working on low sugar/carb diet and exercise for weight loss   For cholesterol   Avoid red meat/ fried foods/ egg yolks/ fatty breakfast meats/ butter, cheese and high fat dairy/ and shellfish   Pack your lunch so you eat less fast food   I hope the new work shift continues for you   Tetanus shot and flu shot today

## 2016-08-15 NOTE — Assessment & Plan Note (Signed)
Disc goals for lipids and reasons to control them Rev labs with pt Rev low sat fat diet in detail On fenofibrate-controlling triglycerides Will cut out fast food and bacon to get it down further  Goals for LDL is under 100  Would rather not mix statin unless necessary

## 2016-08-15 NOTE — Assessment & Plan Note (Signed)
Very muscular build sways bmi as well Discussed how this problem influences overall health and the risks it imposes  Reviewed plan for weight loss with lower calorie diet (via better food choices and also portion control or program like weight watchers) and exercise building up to or more than 30 minutes 5 days per week including some aerobic activity   Will start exercise

## 2017-02-08 ENCOUNTER — Other Ambulatory Visit (INDEPENDENT_AMBULATORY_CARE_PROVIDER_SITE_OTHER): Payer: BLUE CROSS/BLUE SHIELD

## 2017-02-08 DIAGNOSIS — E7800 Pure hypercholesterolemia, unspecified: Secondary | ICD-10-CM

## 2017-02-08 DIAGNOSIS — Z125 Encounter for screening for malignant neoplasm of prostate: Secondary | ICD-10-CM

## 2017-02-08 DIAGNOSIS — I10 Essential (primary) hypertension: Secondary | ICD-10-CM

## 2017-02-08 DIAGNOSIS — R739 Hyperglycemia, unspecified: Secondary | ICD-10-CM

## 2017-02-08 LAB — COMPREHENSIVE METABOLIC PANEL
ALT: 24 U/L (ref 0–53)
AST: 20 U/L (ref 0–37)
Albumin: 4.1 g/dL (ref 3.5–5.2)
Alkaline Phosphatase: 44 U/L (ref 39–117)
BUN: 13 mg/dL (ref 6–23)
CO2: 29 mEq/L (ref 19–32)
Calcium: 9.4 mg/dL (ref 8.4–10.5)
Chloride: 105 mEq/L (ref 96–112)
Creatinine, Ser: 1.18 mg/dL (ref 0.40–1.50)
GFR: 84.07 mL/min (ref >60.00)
Glucose, Bld: 119 mg/dL — ABNORMAL HIGH (ref 70–99)
Potassium: 4 mEq/L (ref 3.5–5.1)
Sodium: 139 mEq/L (ref 135–145)
Total Bilirubin: 0.4 mg/dL (ref 0.2–1.2)
Total Protein: 7.2 g/dL (ref 6.0–8.3)

## 2017-02-08 LAB — LIPID PANEL
Cholesterol: 182 mg/dL (ref 0–200)
HDL: 39.9 mg/dL (ref >39.00)
LDL Cholesterol: 121 mg/dL — ABNORMAL HIGH (ref 0–99)
NonHDL: 141.99
Total CHOL/HDL Ratio: 5
Triglycerides: 105 mg/dL (ref 0.0–149.0)
VLDL: 21 mg/dL (ref 0.0–40.0)

## 2017-02-08 LAB — HEMOGLOBIN A1C: Hgb A1c MFr Bld: 6.5 % (ref 4.6–6.5)

## 2017-02-08 LAB — PSA: PSA: 0.67 ng/mL (ref 0.10–4.00)

## 2017-02-18 ENCOUNTER — Ambulatory Visit: Payer: BLUE CROSS/BLUE SHIELD | Admitting: Family Medicine

## 2017-08-12 ENCOUNTER — Other Ambulatory Visit: Payer: Self-pay | Admitting: Family Medicine

## 2017-10-07 ENCOUNTER — Other Ambulatory Visit: Payer: Self-pay | Admitting: Family Medicine

## 2017-10-07 NOTE — Telephone Encounter (Signed)
Please schedule a f/u and refill until then 

## 2017-10-07 NOTE — Telephone Encounter (Signed)
appt scheduled but per pt med declined he doesn't need any right now he has plenty and this was an auto refill request

## 2017-10-07 NOTE — Telephone Encounter (Signed)
Last OV was 08/15/16 and no recent or future appts., please advise

## 2017-10-27 ENCOUNTER — Telehealth: Payer: Self-pay | Admitting: Family Medicine

## 2017-10-27 DIAGNOSIS — E7800 Pure hypercholesterolemia, unspecified: Secondary | ICD-10-CM

## 2017-10-27 DIAGNOSIS — I10 Essential (primary) hypertension: Secondary | ICD-10-CM

## 2017-10-27 DIAGNOSIS — R739 Hyperglycemia, unspecified: Secondary | ICD-10-CM

## 2017-10-27 NOTE — Telephone Encounter (Signed)
-----   Message from Ellamae Sia sent at 10/22/2017 12:11 PM EST ----- Regarding: Lab orders for Monday, 2.25.19 Lab orders for a f/u appt

## 2017-10-28 ENCOUNTER — Other Ambulatory Visit (INDEPENDENT_AMBULATORY_CARE_PROVIDER_SITE_OTHER): Payer: BLUE CROSS/BLUE SHIELD

## 2017-10-28 DIAGNOSIS — E7800 Pure hypercholesterolemia, unspecified: Secondary | ICD-10-CM | POA: Diagnosis not present

## 2017-10-28 DIAGNOSIS — I10 Essential (primary) hypertension: Secondary | ICD-10-CM | POA: Diagnosis not present

## 2017-10-28 DIAGNOSIS — R739 Hyperglycemia, unspecified: Secondary | ICD-10-CM | POA: Diagnosis not present

## 2017-10-28 LAB — COMPREHENSIVE METABOLIC PANEL
ALT: 24 U/L (ref 0–53)
AST: 15 U/L (ref 0–37)
Albumin: 4 g/dL (ref 3.5–5.2)
Alkaline Phosphatase: 52 U/L (ref 39–117)
BUN: 10 mg/dL (ref 6–23)
CO2: 27 mEq/L (ref 19–32)
Calcium: 9.5 mg/dL (ref 8.4–10.5)
Chloride: 105 mEq/L (ref 96–112)
Creatinine, Ser: 1.18 mg/dL (ref 0.40–1.50)
GFR: 83.83 mL/min (ref >60.00)
Glucose, Bld: 131 mg/dL — ABNORMAL HIGH (ref 70–99)
Potassium: 4 mEq/L (ref 3.5–5.1)
Sodium: 140 mEq/L (ref 135–145)
Total Bilirubin: 0.3 mg/dL (ref 0.2–1.2)
Total Protein: 7.7 g/dL (ref 6.0–8.3)

## 2017-10-28 LAB — LIPID PANEL
Cholesterol: 184 mg/dL (ref 0–200)
HDL: 44.8 mg/dL (ref >39.00)
LDL Cholesterol: 119 mg/dL — ABNORMAL HIGH (ref 0–99)
NonHDL: 138.83
Total CHOL/HDL Ratio: 4
Triglycerides: 97 mg/dL (ref 0.0–149.0)
VLDL: 19.4 mg/dL (ref 0.0–40.0)

## 2017-10-28 LAB — HEMOGLOBIN A1C: Hgb A1c MFr Bld: 6.9 % — ABNORMAL HIGH (ref 4.6–6.5)

## 2017-11-04 ENCOUNTER — Ambulatory Visit: Payer: BLUE CROSS/BLUE SHIELD | Admitting: Family Medicine

## 2017-11-04 ENCOUNTER — Encounter: Payer: Self-pay | Admitting: Family Medicine

## 2017-11-04 VITALS — BP 136/84 | HR 92 | Temp 98.6°F | Ht 73.5 in | Wt 335.0 lb

## 2017-11-04 DIAGNOSIS — Z87898 Personal history of other specified conditions: Secondary | ICD-10-CM | POA: Diagnosis not present

## 2017-11-04 DIAGNOSIS — E7800 Pure hypercholesterolemia, unspecified: Secondary | ICD-10-CM

## 2017-11-04 DIAGNOSIS — I10 Essential (primary) hypertension: Secondary | ICD-10-CM | POA: Diagnosis not present

## 2017-11-04 DIAGNOSIS — E119 Type 2 diabetes mellitus without complications: Secondary | ICD-10-CM | POA: Diagnosis not present

## 2017-11-04 DIAGNOSIS — Z23 Encounter for immunization: Secondary | ICD-10-CM | POA: Diagnosis not present

## 2017-11-04 LAB — MICROALBUMIN / CREATININE URINE RATIO
Creatinine,U: 121.8 mg/dL
Microalb Creat Ratio: 2 mg/g (ref 0.0–30.0)
Microalb, Ur: 2.4 mg/dL — ABNORMAL HIGH (ref 0.0–1.9)

## 2017-11-04 MED ORDER — AMLODIPINE BESYLATE 10 MG PO TABS: 10.0000 mg | ORAL_TABLET | Freq: Every day | ORAL | 3 refills | Status: DC

## 2017-11-04 MED ORDER — FENOFIBRATE MICRONIZED 134 MG PO CAPS: 134.0000 mg | ORAL_CAPSULE | Freq: Every day | ORAL | 3 refills | Status: DC

## 2017-11-04 MED ORDER — SCOPOLAMINE 1 MG/3DAYS TD PT72: 1.0000 | MEDICATED_PATCH | TRANSDERMAL | 0 refills | Status: DC

## 2017-11-04 MED ORDER — METFORMIN HCL 500 MG PO TABS: 500.0000 mg | ORAL_TABLET | Freq: Two times a day (BID) | ORAL | 11 refills | Status: DC

## 2017-11-04 NOTE — Assessment & Plan Note (Signed)
bp in fair control at this time  BP Readings from Last 1 Encounters:  11/04/17 136/84   No changes needed Disc lifstyle change with low sodium diet and exercise   Labs reviewed

## 2017-11-04 NOTE — Assessment & Plan Note (Signed)
Px scop patch for upcoming cruise

## 2017-11-04 NOTE — Assessment & Plan Note (Addendum)
Worse control Lab Results  Component Value Date   HGBA1C 6.9 (H) 10/28/2017   Will begin metformin 500 mg bid  Disc poss side effects  Rev low glycemic diet and plan for exercise  F/u 3 mo  Enc him to make eye exam Prev declined flu shot-will get today

## 2017-11-04 NOTE — Patient Instructions (Addendum)
Don't drink sugar drinks  For occasional soda-do diet    Try to get most of your carbohydrates from produce (with the exception of white potatoes)  Eat less bread/pasta/rice/snack foods/cereals/sweets and other items from the middle of the grocery store (processed carbs)  Get away from chips as much as you can   The keto diet -is not bad - as long as you get fruits and vegetables and don't eat too much unhealthy fat (bacon/ red meat/fried foods)   Start metformin 500 mg twice daily for diabetes  If side effects-alert me   We will get a urine test for protein on the way out   Follow up in 3 months

## 2017-11-04 NOTE — Assessment & Plan Note (Signed)
Discussed how this problem influences overall health and the risks it imposes  Reviewed plan for weight loss with lower calorie diet (via better food choices and also portion control or program like weight watchers) and exercise building up to or more than 30 minutes 5 days per week including some aerobic activity    

## 2017-11-04 NOTE — Progress Notes (Signed)
Subjective:    Patient ID: Charles Watkins, male    DOB: 17-Feb-1966, 52 y.o.   MRN: [id number]  HPI Here for f/u of chronic medical problems   Wt Readings from Last 3 Encounters:  11/04/17 (!) 335 lb (152 kg)  08/15/16 (!) 318 lb 8 oz (144.5 kg)  02/07/16 (!) 314 lb 8 oz (142.7 kg)  her keeps gaining weight  Stretching daily-no other exercise/no cardio most days-occ bike  Thought he was eating "pretty good" 43.60 kg/m   Wife wants him to try a diet   Flu vaccine  -he declines/ does not know why -enc him to think about it   bp is stable today  No cp or palpitations or headaches or edema  No side effects to medicines  BP Readings from Last 3 Encounters:  11/04/17 136/84  08/15/16 128/66  02/07/16 128/76      Lab Results  Component Value Date   CREATININE 1.18 10/28/2017   BUN 10 10/28/2017   NA 140 10/28/2017   K 4.0 10/28/2017   CL 105 10/28/2017   CO2 27 10/28/2017   Glucose 131 Lab Results  Component Value Date   ALT 24 10/28/2017   AST 15 10/28/2017   ALKPHOS 52 10/28/2017   BILITOT 0.3 10/28/2017     DM2 Lab Results  Component Value Date   HGBA1C 6.9 (H) 10/28/2017  this is up from 6.5  Is ok with starting metformin  Almost never eats sweets  One soda per day -occ diet  Come pasta-once per week  No bread  occ biscuit  Too many chips    Hyperlipidemia Lab Results  Component Value Date   CHOL 184 10/28/2017   CHOL 182 02/08/2017   CHOL 189 08/10/2016   Lab Results  Component Value Date   HDL 44.80 10/28/2017   HDL 39.90 02/08/2017   HDL 38.10 (L) 08/10/2016   Lab Results  Component Value Date   LDLCALC 119 (H) 10/28/2017   LDLCALC 121 (H) 02/08/2017   LDLCALC 122 (H) 08/04/2015   Lab Results  Component Value Date   TRIG 97.0 10/28/2017   TRIG 105.0 02/08/2017   TRIG 211.0 (H) 08/10/2016   Lab Results  Component Value Date   CHOLHDL 4 10/28/2017   CHOLHDL 5 02/08/2017   CHOLHDL 5 08/10/2016   Lab Results  Component  Value Date   LDLDIRECT 117.0 08/10/2016   LDLDIRECT 130.7 09/28/2013   LDLDIRECT 136.5 03/12/2012   On fenofibrate  Improved- HDL and LDL and trig  Hesitant to start statin due to fibrate at this time    Going on a cruise  Needs scop patch px just in case   Fell at work-injured shoulder but getting better (L)  Getting slowly better   Patient Active Problem List   Diagnosis Date Noted  . Controlled type 2 diabetes mellitus without complication, without long-term current use of insulin ([place]) 11/04/2017  . H/O motion sickness 11/04/2017  . Screening for HIV (human immunodeficiency virus) 08/09/2015  . Prostate cancer screening 08/03/2015  . Right knee pain 01/14/2015  . Neck pain on left side 03/30/2014  . Routine general medical examination at a health care facility 03/22/2013  . Morbid obesity ([place]) 09/23/2012  . Breast pain in male 04/23/2012  . Hyperglycemia 08/05/2009  . Essential hypertension 08/14/2007  . ALLERGIC RHINITIS 08/14/2007  . BAKER'S CYST 08/14/2007  . SLEEP APNEA 08/14/2007  . HYPERCHOLESTEROLEMIA 01/16/2007  . ADVEF, DRUG/MEDICINAL/BIOLOGICAL SUBST NOS 01/16/2007   Past  Medical History:  Diagnosis Date  . Allergic rhinitis   . Hyperglycemia   . Hyperlipidemia   . Hypertension    Past Surgical History:  Procedure Laterality Date  . Baker's cyst - aspirated  03/2005   Social History   Tobacco Use  . Smoking status: Former Smoker    Packs/day: 0.10    Types: Cigarettes    Last attempt to quit: 08/06/2011    Years since quitting: 6.2  . Smokeless tobacco: Never Used  . Tobacco comment: non smoker  Substance Use Topics  . Alcohol use: Yes    Alcohol/week: 0.0 oz    Comment: drinks on weekend  . Drug use: No   Family History  Problem Relation Age of Onset  . Heart disease Mother   . Thyroid disease Mother   . Hypertension Mother   . Brain cancer Mother    No Known Allergies Current Outpatient Medications on File Prior to Visit    Medication Sig Dispense Refill  . aspirin 81 MG tablet Take 81 mg by mouth daily.      . cetirizine (ZYRTEC) 10 MG tablet Take 10 mg by mouth every other day.     . esomeprazole (NEXIUM) 20 MG capsule Take 20 mg by mouth as needed.    . Multiple Vitamin (MULTIVITAMIN) tablet Take 1 tablet by mouth daily.       No current facility-administered medications on file prior to visit.       Review of Systems  Constitutional: Negative for activity change, appetite change, fatigue, fever and unexpected weight change.  HENT: Negative for congestion, rhinorrhea, sore throat and trouble swallowing.   Eyes: Negative for pain, redness, itching and visual disturbance.  Respiratory: Negative for cough, chest tightness, shortness of breath and wheezing.   Cardiovascular: Negative for chest pain and palpitations.  Gastrointestinal: Negative for abdominal pain, blood in stool, constipation, diarrhea and nausea.  Endocrine: Negative for cold intolerance, heat intolerance, polydipsia and polyuria.  Genitourinary: Negative for difficulty urinating, dysuria, frequency and urgency.  Musculoskeletal: Negative for arthralgias, joint swelling and myalgias.       L shoulder pain-improving   Skin: Negative for pallor and rash.  Neurological: Negative for dizziness, tremors, weakness, numbness and headaches.  Hematological: Negative for adenopathy. Does not bruise/bleed easily.  Psychiatric/Behavioral: Negative for decreased concentration and dysphoric mood. The patient is not nervous/anxious.        Objective:   Physical Exam  Constitutional: He appears well-developed and well-nourished. No distress.  obese and well appearing    HENT:  Head: Normocephalic and atraumatic.  Nose: Nose normal.  Mouth/Throat: Oropharynx is clear and moist.  Nares are boggy  Eyes: Conjunctivae and EOM are normal. Pupils are equal, round, and reactive to light. Right eye exhibits no discharge. Left eye exhibits no discharge. No  scleral icterus.  Neck: Normal range of motion. Neck supple. No JVD present. Carotid bruit is not present. No thyromegaly present.  Cardiovascular: Normal rate, regular rhythm, normal heart sounds and intact distal pulses. Exam reveals no gallop.  Pulmonary/Chest: Effort normal and breath sounds normal. No respiratory distress. He has no wheezes. He exhibits no tenderness.  Abdominal: Soft. Bowel sounds are normal. He exhibits no distension, no abdominal bruit and no mass. There is no tenderness.  Musculoskeletal: He exhibits no edema or tenderness.  Pos hawking/neer test L shoulder  Nl int/ext rotation   Lymphadenopathy:    He has no cervical adenopathy.  Neurological: He is alert. He has normal reflexes. No  cranial nerve deficit. He exhibits normal muscle tone. Coordination normal.  Skin: Skin is warm and dry. No rash noted. No erythema. No pallor.  Psychiatric: He has a normal mood and affect.  Pleasant           Assessment & Plan:   Problem List Items Addressed This Visit      Cardiovascular and Mediastinum   Essential hypertension    bp in fair control at this time  BP Readings from Last 1 Encounters:  11/04/17 136/84   No changes needed Disc lifstyle change with low sodium diet and exercise   Labs reviewed       Relevant Medications   amLODipine (NORVASC) 10 MG tablet   fenofibrate micronized (LOFIBRA) 134 MG capsule     Endocrine   Controlled type 2 diabetes mellitus without complication, without long-term current use of insulin ([facility]) - Primary    Worse control Lab Results  Component Value Date   HGBA1C 6.9 (H) 10/28/2017   Will begin metformin 500 mg bid  Disc poss side effects  Rev low glycemic diet and plan for exercise  F/u 3 mo  Enc him to make eye exam Prev declined flu shot-will get today      Relevant Medications   metFORMIN (GLUCOPHAGE) 500 MG tablet   Other Relevant Orders   Microalbumin / creatinine urine ratio (Completed)     Other   H/O  motion sickness    Px scop patch for upcoming cruise      HYPERCHOLESTEROLEMIA    Disc goals for lipids and reasons to control them Rev labs with pt Rev low sat fat diet in detail Triglycerides are well controlled with fibrate Hesitant to add statin due to poss interaction       Relevant Medications   amLODipine (NORVASC) 10 MG tablet   fenofibrate micronized (LOFIBRA) 134 MG capsule   Morbid obesity ([place])    Discussed how this problem influences overall health and the risks it imposes  Reviewed plan for weight loss with lower calorie diet (via better food choices and also portion control or program like weight watchers) and exercise building up to or more than 30 minutes 5 days per week including some aerobic activity         Relevant Medications   metFORMIN (GLUCOPHAGE) 500 MG tablet    Other Visit Diagnoses    Need for influenza vaccination       Relevant Orders   Flu Vaccine QUAD 6+ mos PF IM (Fluarix Quad PF) (Completed)

## 2017-11-04 NOTE — Assessment & Plan Note (Signed)
Disc goals for lipids and reasons to control them Rev labs with pt Rev low sat fat diet in detail Triglycerides are well controlled with fibrate Hesitant to add statin due to poss interaction

## 2017-12-18 LAB — HM DIABETES EYE EXAM

## 2018-02-03 ENCOUNTER — Other Ambulatory Visit (INDEPENDENT_AMBULATORY_CARE_PROVIDER_SITE_OTHER): Payer: BLUE CROSS/BLUE SHIELD

## 2018-02-03 DIAGNOSIS — E119 Type 2 diabetes mellitus without complications: Secondary | ICD-10-CM | POA: Diagnosis not present

## 2018-02-03 DIAGNOSIS — I10 Essential (primary) hypertension: Secondary | ICD-10-CM | POA: Diagnosis not present

## 2018-02-03 LAB — BASIC METABOLIC PANEL
BUN: 9 mg/dL (ref 6–23)
CO2: 27 mEq/L (ref 19–32)
Calcium: 9.3 mg/dL (ref 8.4–10.5)
Chloride: 105 mEq/L (ref 96–112)
Creatinine, Ser: 1.11 mg/dL (ref 0.40–1.50)
GFR: 89.86 mL/min (ref >60.00)
Glucose, Bld: 118 mg/dL — ABNORMAL HIGH (ref 70–99)
Potassium: 4.1 mEq/L (ref 3.5–5.1)
Sodium: 139 mEq/L (ref 135–145)

## 2018-02-03 LAB — HEMOGLOBIN A1C: Hgb A1c MFr Bld: 6.4 % (ref 4.6–6.5)

## 2018-02-10 ENCOUNTER — Ambulatory Visit: Payer: BLUE CROSS/BLUE SHIELD | Admitting: Family Medicine

## 2018-02-10 ENCOUNTER — Encounter: Payer: Self-pay | Admitting: Family Medicine

## 2018-02-10 VITALS — BP 144/82 | HR 83 | Temp 98.7°F | Ht 73.5 in | Wt 324.5 lb

## 2018-02-10 DIAGNOSIS — E119 Type 2 diabetes mellitus without complications: Secondary | ICD-10-CM

## 2018-02-10 DIAGNOSIS — I10 Essential (primary) hypertension: Secondary | ICD-10-CM

## 2018-02-10 DIAGNOSIS — M25562 Pain in left knee: Secondary | ICD-10-CM

## 2018-02-10 NOTE — Patient Instructions (Addendum)
When you crave sweets- try to supplement fruit whenever possible  Keeping sweets out of the house may help also   Follow up for annual exam in 6 months   Try to work on healthy diet  Try to get most of your carbohydrates from produce (with the exception of white potatoes)  Eat less bread/pasta/rice/snack foods/cereals/sweets and other items from the middle of the grocery store (processed carbs)   We will set you up with an appt with Dr Lorelei Pont for the knee problems

## 2018-02-10 NOTE — Assessment & Plan Note (Signed)
Discussed how this problem influences overall health and the risks it imposes  Reviewed plan for weight loss with lower calorie diet (via better food choices and also portion control or program like weight watchers) and exercise building up to or more than 30 minutes 5 days per week including some aerobic activity   Commended on wt loss so far  

## 2018-02-10 NOTE — Assessment & Plan Note (Signed)
bp in fair control at this time  BP Readings from Last 1 Encounters:  02/10/18 (!) 144/82   No changes needed Most recent labs reviewed  Disc lifstyle change with low sodium diet and exercise

## 2018-02-10 NOTE — Progress Notes (Signed)
Subjective:    Patient ID: Charles Watkins, male    DOB: Jan 19, 1966, 52 y.o.   MRN: [id number]  HPI  Here for f/u of chronic health problems   Wt Readings from Last 3 Encounters:  02/10/18 (!) 324 lb 8 oz (147.2 kg)  11/04/17 (!) 335 lb (152 kg)  08/15/16 (!) 318 lb 8 oz (144.5 kg)  wt is down 11 lb  Eating healthy- somewhat  Getting exercise  42.23 kg/m    bp is stable today  No cp or palpitations or headaches or edema  No side effects to medicines  BP Readings from Last 3 Encounters:  02/10/18 (!) 144/82  11/04/17 136/84  08/15/16 128/66      DM2 Lab Results  Component Value Date   HGBA1C 6.4 02/03/2018   This is down from 6.9 last time  Added metformin 500 mg bid and discussed diet  Gets a little diarrhea from it - not bad  Eye exam - had that a few months ago and had new glasses -no retinopathy   He occ craves sweets -- then eats a lot of them/ but not often  He thinks he could supplement fruit   Takes fenofibrate for triglycerides  Lab Results  Component Value Date   CHOL 184 10/28/2017   HDL 44.80 10/28/2017   LDLCALC 119 (H) 10/28/2017   LDLDIRECT 117.0 08/10/2016   TRIG 97.0 10/28/2017   CHOLHDL 4 10/28/2017    He makes an effort to eat vegetables / better overall   Has a lump on R knee- on patella about a month  Some pain behind knee  He hyperflexed knee on the couch - and felt pop   No known trauma- but not sure  Not kneeling   Some skin spots on L ankle as well   Hx of bakers cyst on r side   Patient Active Problem List   Diagnosis Date Noted  . Left knee pain 02/10/2018  . Controlled type 2 diabetes mellitus without complication, without long-term current use of insulin ([place]) 11/04/2017  . H/O motion sickness 11/04/2017  . Screening for HIV (human immunodeficiency virus) 08/09/2015  . Prostate cancer screening 08/03/2015  . Neck pain on left side 03/30/2014  . Routine general medical examination at a health care facility  03/22/2013  . Morbid obesity ([place]) 09/23/2012  . Breast pain in male 04/23/2012  . Essential hypertension 08/14/2007  . ALLERGIC RHINITIS 08/14/2007  . BAKER'S CYST 08/14/2007  . SLEEP APNEA 08/14/2007  . HYPERCHOLESTEROLEMIA 01/16/2007  . ADVEF, DRUG/MEDICINAL/BIOLOGICAL SUBST NOS 01/16/2007   Past Medical History:  Diagnosis Date  . Allergic rhinitis   . Hyperglycemia   . Hyperlipidemia   . Hypertension    Past Surgical History:  Procedure Laterality Date  . Baker's cyst - aspirated  03/2005   Social History   Tobacco Use  . Smoking status: Former Smoker    Packs/day: 0.10    Types: Cigarettes    Last attempt to quit: 08/06/2011    Years since quitting: 6.5  . Smokeless tobacco: Never Used  . Tobacco comment: non smoker  Substance Use Topics  . Alcohol use: Yes    Alcohol/week: 0.0 oz    Comment: drinks on weekend  . Drug use: No   Family History  Problem Relation Age of Onset  . Heart disease Mother   . Thyroid disease Mother   . Hypertension Mother   . Brain cancer Mother    No Known Allergies Current Outpatient  Medications on File Prior to Visit  Medication Sig Dispense Refill  . amLODipine (NORVASC) 10 MG tablet Take 1 tablet (10 mg total) by mouth daily. 90 tablet 3  . aspirin 81 MG tablet Take 81 mg by mouth daily.      . cetirizine (ZYRTEC) 10 MG tablet Take 10 mg by mouth every other day.     . esomeprazole (NEXIUM) 20 MG capsule Take 20 mg by mouth as needed.    . fenofibrate micronized (LOFIBRA) 134 MG capsule Take 1 capsule (134 mg total) by mouth daily. 90 capsule 3  . metFORMIN (GLUCOPHAGE) 500 MG tablet Take 1 tablet (500 mg total) by mouth 2 (two) times daily with a meal. 30 tablet 11  . Multiple Vitamin (MULTIVITAMIN) tablet Take 1 tablet by mouth daily.      Marland Kitchen scopolamine (TRANSDERM-SCOP, 1.5 MG,) 1 MG/3DAYS Place 1 patch (1.5 mg total) onto the skin every 3 (three) days. 4 patch 0   No current facility-administered medications on file prior to  visit.     Review of Systems  Constitutional: Negative for activity change, appetite change, fatigue, fever and unexpected weight change.  HENT: Negative for congestion, rhinorrhea, sore throat and trouble swallowing.        Occ glands under jaw feel swollen on L  Eyes: Negative for pain, redness, itching and visual disturbance.  Respiratory: Negative for cough, chest tightness, shortness of breath and wheezing.   Cardiovascular: Negative for chest pain and palpitations.  Gastrointestinal: Negative for abdominal pain, blood in stool, constipation, diarrhea and nausea.  Endocrine: Negative for cold intolerance, heat intolerance, polydipsia and polyuria.  Genitourinary: Negative for difficulty urinating, dysuria, frequency and urgency.  Musculoskeletal: Negative for arthralgias, joint swelling and myalgias.       L knee pain and lump over knee cap  Skin: Negative for pallor and rash.       Some moles/dark spots on lower legs  Neurological: Negative for dizziness, tremors, weakness, numbness and headaches.  Hematological: Negative for adenopathy. Does not bruise/bleed easily.  Psychiatric/Behavioral: Negative for decreased concentration and dysphoric mood. The patient is not nervous/anxious.        Objective:   Physical Exam  Constitutional: He appears well-developed and well-nourished. No distress.  obese and well appearing   HENT:  Head: Normocephalic and atraumatic.  Mouth/Throat: Oropharynx is clear and moist.  Eyes: Pupils are equal, round, and reactive to light. Conjunctivae and EOM are normal. No scleral icterus.  Neck: Normal range of motion. Neck supple. No JVD present. Carotid bruit is not present. No thyromegaly present.  Cardiovascular: Normal rate, regular rhythm, normal heart sounds and intact distal pulses. Exam reveals no gallop.  No murmur heard. Pulmonary/Chest: Effort normal and breath sounds normal. No respiratory distress. He has no wheezes. He has no rales.  No  crackles  Abdominal: Soft. Bowel sounds are normal. He exhibits no distension, no abdominal bruit and no mass. There is no tenderness.  Musculoskeletal: He exhibits no edema.  L knee- mildly tender and swollen area over patella consistent with patellar bursitis- no erythema or warmth Tender (mild) behind knee  Lymphadenopathy:    He has no cervical adenopathy.  Neurological: He is alert. He has normal reflexes. He displays normal reflexes. No cranial nerve deficit. Coordination normal.  Skin: Skin is warm and dry. No rash noted.  Several hyperpigmented areas L lower leg  Psychiatric: He has a normal mood and affect.  Pleasant  Assessment & Plan:   Problem List Items Addressed This Visit      Cardiovascular and Mediastinum   Essential hypertension    bp in fair control at this time  BP Readings from Last 1 Encounters:  02/10/18 (!) 144/82   No changes needed Most recent labs reviewed  Disc lifstyle change with low sodium diet and exercise          Endocrine   Controlled type 2 diabetes mellitus without complication, without long-term current use of insulin ([place]) - Primary    Lab Results  Component Value Date   HGBA1C 6.4 02/03/2018   Improved with metformin  Disc low glycemic diet in detail- disc ways to stop binge eating sweets Plan for exercise  Sent for opthy exam report-recent with no retinopathy        Other   Left knee pain    Exam consistent with patellar bursitis  Also some pain behind knee  Ref to sport med      Morbid obesity ([place])    Discussed how this problem influences overall health and the risks it imposes  Reviewed plan for weight loss with lower calorie diet (via better food choices and also portion control or program like weight watchers) and exercise building up to or more than 30 minutes 5 days per week including some aerobic activity   Commended on wt loss so far

## 2018-02-10 NOTE — Assessment & Plan Note (Signed)
Lab Results  Component Value Date   HGBA1C 6.4 02/03/2018   Improved with metformin  Disc low glycemic diet in detail- disc ways to stop binge eating sweets Plan for exercise  Sent for opthy exam report-recent with no retinopathy

## 2018-02-10 NOTE — Assessment & Plan Note (Signed)
Exam consistent with patellar bursitis  Also some pain behind knee  Ref to sport med

## 2018-02-11 NOTE — Progress Notes (Signed)
Dr. Frederico Hamman T. Braylei Totino, MD, [facility] [place], [address] Phone: [phone] Fax: [phone]  02/12/2018  Patient: Charles Watkins, MRN: [id number], DOB: 1965-12-06, 52 y.o.  Primary Physician:  Tower, Wynelle Fanny, MD   Chief Complaint  Patient presents with  . Knee Pain    Left x 1 month   Subjective:   Charles Watkins is a 52 y.o. very pleasant male patient who presents with the following:  Very pleasant gentleman who is seen courtesy of Dr. Glori Bickers for evaluation of left-sided knee pain as well as some swelling on the anterior aspect of his knee.  Heard something pop when he was on the sofa. Deep flexion with leg underneath him. Has been hurting every since then. Has not tried anything at home.   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 Squatting deep and bending at the knee hurts him the most. No mechanical symptoms, no catching or popping. No giving way.   Lab Results  Component Value Date   HGBA1C 6.4 02/03/2018    L knee inj  Past Medical History, Surgical History, Social History, Family History, Problem List, Medications, and Allergies have been reviewed and updated if relevant.  Patient Active Problem List   Diagnosis Date Noted  . Left knee pain 02/10/2018  . Controlled type 2 diabetes mellitus without complication, without long-term current use of insulin ([place]) 11/04/2017  . H/O motion sickness 11/04/2017  . Screening for HIV (human immunodeficiency virus) 08/09/2015  . Prostate cancer screening 08/03/2015  . Routine general medical examination at a health care facility 03/22/2013  . Morbid obesity ([place]) 09/23/2012  . Breast pain in male 04/23/2012  . Essential hypertension 08/14/2007  . ALLERGIC RHINITIS 08/14/2007  . BAKER'S CYST 08/14/2007  . SLEEP APNEA 08/14/2007  . HYPERCHOLESTEROLEMIA 01/16/2007  . ADVEF, DRUG/MEDICINAL/BIOLOGICAL SUBST NOS 01/16/2007    Past Medical History:    Diagnosis Date  . Allergic rhinitis   . Hyperglycemia   . Hyperlipidemia   . Hypertension     Past Surgical History:  Procedure Laterality Date  . Baker's cyst - aspirated  03/2005    Social History   Socioeconomic History  . Marital status: Married    Spouse name: Not on file  . Number of children: 2  . Years of education: Not on file  . Highest education level: Not on file  Occupational History  . Occupation: [employer] Interior and spatial designer: [employer]  Social Needs  . Financial resource strain: Not on file  . Food insecurity:    Worry: Not on file    Inability: Not on file  . Transportation needs:    Medical: Not on file    Non-medical: Not on file  Tobacco Use  . Smoking status: Former Smoker    Packs/day: 0.10    Types: Cigarettes    Last attempt to quit: 08/06/2011    Years since quitting: 6.5  . Smokeless tobacco: Never Used  . Tobacco comment: non smoker  Substance and Sexual Activity  . Alcohol use: Yes    Alcohol/week: 0.0 oz    Comment: drinks on weekend  . Drug use: No  . Sexual activity: Not on file  Lifestyle  . Physical activity:    Days per week: Not on file    Minutes per session: Not on file  . Stress: Not on file  Relationships  . Social connections:    Talks  on phone: Not on file    Gets together: Not on file    Attends religious service: Not on file    Active member of club or organization: Not on file    Attends meetings of clubs or organizations: Not on file    Relationship status: Not on file  . Intimate partner violence:    Fear of current or ex partner: Not on file    Emotionally abused: Not on file    Physically abused: Not on file    Forced sexual activity: Not on file  Other Topics Concern  . Not on file  Social History Narrative  . Not on file    Family History  Problem Relation Age of Onset  . Heart disease Mother   . Thyroid disease Mother   . Hypertension Mother   . Brain cancer Mother     No  Known Allergies  Medication list reviewed and updated in full in [facility].  GEN: No fevers, chills. Nontoxic. Primarily MSK c/o today. MSK: Detailed in the HPI GI: tolerating PO intake without difficulty Neuro: No numbness, parasthesias, or tingling associated. Otherwise the pertinent positives of the ROS are noted above.   Objective:   BP 140/80   Pulse 87   Temp 97.7 F (36.5 C) (Oral)   Ht 6' 1.5" (1.867 m)   Wt (!) 327 lb 8 oz (148.6 kg)   BMI 42.62 kg/m    GEN: WDWN, NAD, Non-toxic, Alert & Oriented x 3 HEENT: Atraumatic, Normocephalic.  Ears and Nose: No external deformity. EXTR: No clubbing/cyanosis/edema NEURO: Normal gait.  PSYCH: Normally interactive. Conversant. Not depressed or anxious appearing.  Calm demeanor.   Knee:  L Gait: Normal heel toe pattern ROM: 0-112 Effusion: minimal Echymosis or edema: none Patellar tendon NT Painful PLICA: neg Patellar grind: negative Medial and lateral patellar facet loading: negative medial and lateral joint lines: mild only, worse medially Mcmurray's + for pain Flexion-pinch positive Varus and valgus stress: stable Lachman: neg Ant and Post drawer: neg Hip abduction, IR, ER: WNL Hip flexion str: 5/5 Hip abd: 5/5 Quad: 5/5 VMO atrophy:No Hamstring concentric and eccentric: 5/5   Radiology: Dg Knee 4 Views W/patella Left  Result Date: 02/12/2018 CLINICAL DATA:  Left knee pain for 1 month, no injury. EXAM: LEFT KNEE - COMPLETE 4+ VIEW COMPARISON:  09/06/2010. FINDINGS: On the left, there may be a tiny exostosis along the medial aspect of the medial femoral condyle. Left knee is otherwise unremarkable. Comparison view of the right knee shows chondrocalcinosis and lateral compartment osteophytosis. IMPRESSION: 1. Possible tiny exostosis along the medial aspect of the medial femoral condyle. Otherwise, left knee is unremarkable. 2. 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 Electronically  Signed   By: Lorin Picket M.D.   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 ROM, Str, mod activities.   Knee Injection, L Patient verbally consented to procedure. Risks (including potential rare risk of infection), benefits, and alternatives explained. Sterilely prepped with Chloraprep. Ethyl cholride used for anesthesia. 8 cc Lidocaine 1% mixed with 2 mL Depo-Medrol 40 mg injected using the anteromedial approach without difficulty. No complications with procedure  and tolerated well. Patient had decreased pain post-injection.   Follow-up: Return in about 5 weeks (around 03/19/2018).  Meds ordered this encounter  Medications  . methylPREDNISolone acetate (DEPO-MEDROL) injection 80 mg   There are no discontinued medications. Orders Placed This Encounter  Procedures  . DG Knee 4 Views W/Patella Left    Signed,  Waldon Sheerin T. Braelynn Lupton, MD   Allergies as of 02/12/2018   No Known Allergies     Medication List        Accurate as of 02/12/18 11:59 PM. Always use your most recent med list.          amLODipine 10 MG tablet Commonly known as:  NORVASC Take 1 tablet (10 mg total) by mouth daily.   aspirin 81 MG tablet Take 81 mg by mouth daily.   cetirizine 10 MG tablet Commonly known as:  ZYRTEC Take 10 mg by mouth every other day.   esomeprazole 20 MG capsule Commonly known as:  NEXIUM Take 20 mg by mouth as needed.   fenofibrate micronized 134 MG capsule Commonly known as:  LOFIBRA Take 1 capsule (134 mg total) by mouth daily.   metFORMIN 500 MG tablet Commonly known as:  GLUCOPHAGE Take 1 tablet (500 mg total) by mouth 2 (two)  times daily with a meal.   multivitamin tablet Take 1 tablet by mouth daily.   scopolamine 1 MG/3DAYS Commonly known as:  TRANSDERM-SCOP (1.5 MG) Place 1 patch (1.5 mg total) onto the skin every 3 (three) days.

## 2018-02-12 ENCOUNTER — Ambulatory Visit (INDEPENDENT_AMBULATORY_CARE_PROVIDER_SITE_OTHER)
Admission: RE | Admit: 2018-02-12 | Discharge: 2018-02-12 | Disposition: A | Discharge status: Home / Self Care | Payer: BLUE CROSS/BLUE SHIELD | Source: Ambulatory Visit | Attending: Family Medicine | Admitting: Family Medicine

## 2018-02-12 ENCOUNTER — Encounter: Payer: Self-pay | Admitting: Family Medicine

## 2018-02-12 ENCOUNTER — Ambulatory Visit: Payer: BLUE CROSS/BLUE SHIELD | Admitting: Family Medicine

## 2018-02-12 VITALS — BP 140/80 | HR 87 | Temp 97.7°F | Ht 73.5 in | Wt 327.5 lb

## 2018-02-12 DIAGNOSIS — M7042 Prepatellar bursitis, left knee: Secondary | ICD-10-CM | POA: Diagnosis not present

## 2018-02-12 DIAGNOSIS — M25562 Pain in left knee: Secondary | ICD-10-CM

## 2018-02-12 DIAGNOSIS — M2392 Unspecified internal derangement of left knee: Secondary | ICD-10-CM | POA: Diagnosis not present

## 2018-02-12 DIAGNOSIS — M1712 Unilateral primary osteoarthritis, left knee: Secondary | ICD-10-CM | POA: Diagnosis not present

## 2018-02-12 DIAGNOSIS — E119 Type 2 diabetes mellitus without complications: Secondary | ICD-10-CM

## 2018-02-12 MED ORDER — METHYLPREDNISOLONE ACETATE 40 MG/ML IJ SUSP
80.0000 mg | Freq: Once | INTRAMUSCULAR | Status: AC
  Administered 2018-02-12: 80 mg via INTRA_ARTICULAR

## 2018-02-14 ENCOUNTER — Telehealth: Payer: Self-pay

## 2018-02-14 NOTE — Telephone Encounter (Signed)
Dr Valma Cava dentist received record request from our office and Dr Valma Cava wants to know if pt is diabetic; advised per problem list that pt is diabetic.

## 2018-02-14 NOTE — Telephone Encounter (Signed)
Copied from [place] [phone] [phone]. Topic: Inquiry >> Feb 14, 2018  4:41 PM Oliver Pila B wrote: Reason for CRM: [facility] called to make sure the request for the diabetic record was received to fax back to they're office;  contact [phone]

## 2018-03-19 ENCOUNTER — Encounter: Payer: Self-pay | Admitting: Family Medicine

## 2018-03-19 ENCOUNTER — Ambulatory Visit: Payer: BLUE CROSS/BLUE SHIELD | Admitting: Family Medicine

## 2018-03-19 VITALS — BP 132/78 | HR 76 | Temp 98.5°F | Ht 73.5 in | Wt 325.8 lb

## 2018-03-19 DIAGNOSIS — M2392 Unspecified internal derangement of left knee: Secondary | ICD-10-CM | POA: Diagnosis not present

## 2018-03-19 DIAGNOSIS — M7042 Prepatellar bursitis, left knee: Secondary | ICD-10-CM

## 2018-03-19 NOTE — Progress Notes (Signed)
Dr. Frederico Hamman T. Moni Rothrock, MD, [facility] Medicine [place], [address] Phone: [phone] Fax: [phone]  03/19/2018  Patient: Charles Watkins, MRN: [id number], DOB: June 29, 1966, 52 y.o.  Primary Physician:  Tower, Wynelle Fanny, MD   Chief Complaint  Patient presents with  . Follow-up    knee   Subjective:   Charles Watkins is a 52 y.o. very pleasant male patient who presents with the following:  Positive gentleman who had a recent knee injury and I saw him and thought he probably had a degenerative meniscal tear.  He has been active and moving his knee, and works a lot.  I did a knee injection on his left knee last office visit, and at that point and from now on he is feeling quite good with minimal symptoms only with terminal rotational movements at the knee.  Past Medical History, Surgical History, Social History, Family History, Problem List, Medications, and Allergies have been reviewed and updated if relevant.  Patient Active Problem List   Diagnosis Date Noted  . Left knee pain 02/10/2018  . Controlled type 2 diabetes mellitus without complication, without long-term current use of insulin ([place]) 11/04/2017  . H/O motion sickness 11/04/2017  . Screening for HIV (human immunodeficiency virus) 08/09/2015  . Prostate cancer screening 08/03/2015  . Routine general medical examination at a health care facility 03/22/2013  . Morbid obesity ([place]) 09/23/2012  . Breast pain in male 04/23/2012  . Essential hypertension 08/14/2007  . ALLERGIC RHINITIS 08/14/2007  . BAKER'S CYST 08/14/2007  . SLEEP APNEA 08/14/2007  . HYPERCHOLESTEROLEMIA 01/16/2007  . ADVEF, DRUG/MEDICINAL/BIOLOGICAL SUBST NOS 01/16/2007    Past Medical History:  Diagnosis Date  . Allergic rhinitis   . Hyperglycemia   . Hyperlipidemia   . Hypertension     Past Surgical History:  Procedure Laterality Date  . Baker's cyst - aspirated  03/2005    Social  History   Socioeconomic History  . Marital status: Married    Spouse name: Not on file  . Number of children: 2  . Years of education: Not on file  . Highest education level: Not on file  Occupational History  . 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  Social Needs  . Financial resource strain: Not on file  . Food insecurity:    Worry: Not on file    Inability: Not on file  . Transportation needs:    Medical: Not on file    Non-medical: Not on file  Tobacco Use  . Smoking status: Former Smoker    Packs/day: 0.10    Types: Cigarettes    Last attempt to quit: 08/06/2011    Years since quitting: 6.6  . Smokeless tobacco: Never Used  . Tobacco comment: non smoker  Substance and Sexual Activity  . Alcohol use: Yes    Alcohol/week: 0.0 oz    Comment: drinks on weekend  . Drug use: No  . Sexual activity: Not on file  Lifestyle  . Physical activity:    Days per week: Not on file    Minutes per session: Not on file  . Stress: Not on file  Relationships  . Social connections:    Talks on phone: Not on file    Gets together: Not on file    Attends religious service: Not on file    Active member of club or organization: Not on file    Attends meetings of  clubs or organizations: Not on file    Relationship status: Not on file  . Intimate partner violence:    Fear of current or ex partner: Not on file    Emotionally abused: Not on file    Physically abused: Not on file    Forced sexual activity: Not on file  Other Topics Concern  . Not on file  Social History Narrative  . Not on file    Family History  Problem Relation Age of Onset  . Heart disease Mother   . Thyroid disease Mother   . Hypertension Mother   . Brain cancer Mother     No Known Allergies  Medication list reviewed and updated in full in [facility].  GEN: No fevers, chills. Nontoxic. Primarily MSK c/o today. MSK: Detailed in the HPI GI: tolerating PO intake without  difficulty Neuro: No numbness, parasthesias, or tingling associated. Otherwise the pertinent positives of the ROS are noted above.   Objective:   BP 132/78   Pulse 76   Temp 98.5 F (36.9 C) (Oral)   Ht 6' 1.5" (1.867 m)   Wt (!) 325 lb 12 oz (147.8 kg)   BMI 42.39 kg/m    GEN: WDWN, NAD, Non-toxic, Alert & Oriented x 3 HEENT: Atraumatic, Normocephalic.  Ears and Nose: No external deformity. EXTR: No clubbing/cyanosis/edema NEURO: Normal gait.  PSYCH: Normally interactive. Conversant. Not depressed or anxious appearing.  Calm demeanor.   Knee:  L Gait: Normal heel toe pattern ROM: 0-125 Effusion: neg Echymosis or edema: none Patellar tendon NT Painful PLICA: neg Patellar grind: negative Medial and lateral patellar facet loading: negative medial and lateral joint lines:NT Mcmurray's neg Flexion-pinch neg Varus and valgus stress: stable Lachman: neg Ant and Post drawer: neg Hip abduction, IR, ER: WNL Hip flexion str: 5/5 Hip abd: 5/5 Quad: 5/5 VMO atrophy:No Hamstring concentric and eccentric: 5/5   Radiology: No results found.  Assessment and Plan:   Internal derangement of left knee  Prepatellar bursitis of left knee  Virtually asx now, doing great, f/u prn  Signed,  Adelheid Hoggard T. Kairos Panetta, MD   Allergies as of 03/19/2018   No Known Allergies     Medication List        Accurate as of 03/19/18  8:53 AM. Always use your most recent med list.          amLODipine 10 MG tablet Commonly known as:  NORVASC Take 1 tablet (10 mg total) by mouth daily.   aspirin 81 MG tablet Take 81 mg by mouth daily.   cetirizine 10 MG tablet Commonly known as:  ZYRTEC Take 10 mg by mouth every other day.   esomeprazole 20 MG capsule Commonly known as:  NEXIUM Take 20 mg by mouth as needed.   fenofibrate micronized 134 MG capsule Commonly known as:  LOFIBRA Take 1 capsule (134 mg total) by mouth daily.   metFORMIN 500 MG tablet Commonly known as:   GLUCOPHAGE Take 1 tablet (500 mg total) by mouth 2 (two) times daily with a meal.   multivitamin tablet Take 1 tablet by mouth daily.   scopolamine 1 MG/3DAYS Commonly known as:  TRANSDERM-SCOP (1.5 MG) Place 1 patch (1.5 mg total) onto the skin every 3 (three) days.

## 2018-04-01 ENCOUNTER — Encounter: Payer: Self-pay | Admitting: Family Medicine

## 2018-07-14 ENCOUNTER — Other Ambulatory Visit: Payer: Self-pay | Admitting: *Deleted

## 2018-07-14 MED ORDER — METFORMIN HCL 500 MG PO TABS: 500.0000 mg | ORAL_TABLET | Freq: Two times a day (BID) | ORAL | 1 refills | Status: DC

## 2018-08-14 ENCOUNTER — Telehealth: Payer: Self-pay | Admitting: Family Medicine

## 2018-08-14 DIAGNOSIS — E7800 Pure hypercholesterolemia, unspecified: Secondary | ICD-10-CM

## 2018-08-14 DIAGNOSIS — E119 Type 2 diabetes mellitus without complications: Secondary | ICD-10-CM

## 2018-08-14 DIAGNOSIS — I10 Essential (primary) hypertension: Secondary | ICD-10-CM

## 2018-08-14 DIAGNOSIS — Z125 Encounter for screening for malignant neoplasm of prostate: Secondary | ICD-10-CM

## 2018-08-14 NOTE — Telephone Encounter (Signed)
-----   Message from Lendon Collar, RT sent at 08/05/2018 10:53 AM EST ----- Regarding: Lab orders for Friday Dec 13th Please enter CPE lab orders for 08/15/18. Thanks!

## 2018-08-15 ENCOUNTER — Other Ambulatory Visit (INDEPENDENT_AMBULATORY_CARE_PROVIDER_SITE_OTHER): Payer: BLUE CROSS/BLUE SHIELD

## 2018-08-15 DIAGNOSIS — Z125 Encounter for screening for malignant neoplasm of prostate: Secondary | ICD-10-CM | POA: Diagnosis not present

## 2018-08-15 DIAGNOSIS — E7800 Pure hypercholesterolemia, unspecified: Secondary | ICD-10-CM

## 2018-08-15 DIAGNOSIS — E119 Type 2 diabetes mellitus without complications: Secondary | ICD-10-CM | POA: Diagnosis not present

## 2018-08-15 DIAGNOSIS — I10 Essential (primary) hypertension: Secondary | ICD-10-CM | POA: Diagnosis not present

## 2018-08-15 LAB — TSH: TSH: 3.51 u[IU]/mL (ref 0.35–4.50)

## 2018-08-15 LAB — COMPREHENSIVE METABOLIC PANEL
ALT: 22 U/L (ref 0–53)
AST: 19 U/L (ref 0–37)
Albumin: 4.2 g/dL (ref 3.5–5.2)
Alkaline Phosphatase: 46 U/L (ref 39–117)
BUN: 13 mg/dL (ref 6–23)
CO2: 26 mEq/L (ref 19–32)
Calcium: 9.4 mg/dL (ref 8.4–10.5)
Chloride: 104 mEq/L (ref 96–112)
Creatinine, Ser: 1.18 mg/dL (ref 0.40–1.50)
GFR: 83.57 mL/min (ref >60.00)
Glucose, Bld: 101 mg/dL — ABNORMAL HIGH (ref 70–99)
Potassium: 4.2 mEq/L (ref 3.5–5.1)
Sodium: 139 mEq/L (ref 135–145)
Total Bilirubin: 0.3 mg/dL (ref 0.2–1.2)
Total Protein: 7.5 g/dL (ref 6.0–8.3)

## 2018-08-15 LAB — LIPID PANEL
Cholesterol: 176 mg/dL (ref 0–200)
HDL: 44.3 mg/dL (ref >39.00)
LDL Cholesterol: 104 mg/dL — ABNORMAL HIGH (ref 0–99)
NonHDL: 131.7
Total CHOL/HDL Ratio: 4
Triglycerides: 137 mg/dL (ref 0.0–149.0)
VLDL: 27.4 mg/dL (ref 0.0–40.0)

## 2018-08-15 LAB — CBC WITH DIFFERENTIAL/PLATELET
Basophils Absolute: 0 10*3/uL (ref 0.0–0.1)
Basophils Relative: 0.8 % (ref 0.0–3.0)
Eosinophils Absolute: 0.2 10*3/uL (ref 0.0–0.7)
Eosinophils Relative: 3.9 % (ref 0.0–5.0)
HCT: 41.5 % (ref 39.0–52.0)
Hemoglobin: 13.8 g/dL (ref 13.0–17.0)
Lymphocytes Relative: 55 % — ABNORMAL HIGH (ref 12.0–46.0)
Lymphs Abs: 2.7 10*3/uL (ref 0.7–4.0)
MCHC: 33.2 g/dL (ref 30.0–36.0)
MCV: 90.1 fl (ref 78.0–100.0)
Monocytes Absolute: 0.6 10*3/uL (ref 0.1–1.0)
Monocytes Relative: 12.2 % — ABNORMAL HIGH (ref 3.0–12.0)
Neutro Abs: 1.4 10*3/uL (ref 1.4–7.7)
Neutrophils Relative %: 28.1 % — ABNORMAL LOW (ref 43.0–77.0)
Platelets: 293 10*3/uL (ref 150.0–400.0)
RBC: 4.6 Mil/uL (ref 4.22–5.81)
RDW: 13.9 % (ref 11.5–15.5)
WBC: 4.9 10*3/uL (ref 4.0–10.5)

## 2018-08-15 LAB — PSA: PSA: 1.58 ng/mL (ref 0.10–4.00)

## 2018-08-15 LAB — HEMOGLOBIN A1C: Hgb A1c MFr Bld: 6.3 % (ref 4.6–6.5)

## 2018-08-18 ENCOUNTER — Encounter: Payer: Self-pay | Admitting: Internal Medicine

## 2018-08-18 ENCOUNTER — Encounter: Payer: Self-pay | Admitting: Family Medicine

## 2018-08-18 ENCOUNTER — Ambulatory Visit (INDEPENDENT_AMBULATORY_CARE_PROVIDER_SITE_OTHER): Payer: BLUE CROSS/BLUE SHIELD | Admitting: Family Medicine

## 2018-08-18 VITALS — BP 130/68 | HR 81 | Temp 98.2°F | Ht 73.5 in | Wt 323.8 lb

## 2018-08-18 DIAGNOSIS — Z23 Encounter for immunization: Secondary | ICD-10-CM

## 2018-08-18 DIAGNOSIS — Z1211 Encounter for screening for malignant neoplasm of colon: Secondary | ICD-10-CM

## 2018-08-18 DIAGNOSIS — E119 Type 2 diabetes mellitus without complications: Secondary | ICD-10-CM

## 2018-08-18 DIAGNOSIS — I10 Essential (primary) hypertension: Secondary | ICD-10-CM

## 2018-08-18 DIAGNOSIS — Z125 Encounter for screening for malignant neoplasm of prostate: Secondary | ICD-10-CM

## 2018-08-18 DIAGNOSIS — E7800 Pure hypercholesterolemia, unspecified: Secondary | ICD-10-CM

## 2018-08-18 DIAGNOSIS — Z0000 Encounter for general adult medical examination without abnormal findings: Secondary | ICD-10-CM

## 2018-08-18 MED ORDER — ESOMEPRAZOLE MAGNESIUM 20 MG PO CPDR: 20.0000 mg | DELAYED_RELEASE_CAPSULE | ORAL | 3 refills | Status: DC | PRN

## 2018-08-18 MED ORDER — AMLODIPINE BESYLATE 10 MG PO TABS: 10.0000 mg | ORAL_TABLET | Freq: Every day | ORAL | 3 refills | Status: DC

## 2018-08-18 MED ORDER — METFORMIN HCL 500 MG PO TABS: 500.0000 mg | ORAL_TABLET | Freq: Two times a day (BID) | ORAL | 3 refills | Status: DC

## 2018-08-18 MED ORDER — FENOFIBRATE MICRONIZED 134 MG PO CAPS: 134.0000 mg | ORAL_CAPSULE | Freq: Every day | ORAL | 3 refills | Status: DC

## 2018-08-18 NOTE — Patient Instructions (Addendum)
Keep thinking about fitting in exercise - when things slow down for you  Try to get most of your carbohydrates from produce (with the exception of white potatoes)  Eat less bread/pasta/rice/snack foods/cereals/sweets and other items from the middle of the grocery store (processed carbs)   For the rash spots on your ankles  Use an over the counter cortisone cream like cort aid Also moisturize Watch out for anything that could rub those spots -shoe/boot/mattress/sitting position   Flu shot today  Pneumonia shot today   We will refer you for a screening colonoscopy   Labs are stable  Follow up in 6 months   Take care of yourself

## 2018-08-18 NOTE — Assessment & Plan Note (Signed)
Reviewed health habits including diet and exercise and skin cancer prevention Reviewed appropriate screening tests for age  Also reviewed health mt list, fam hx and immunization status , as well as social and family history   See HPI Labs reviewed  Wt loss encouraged PNA 23 and flu shots today  Referred for first screening colonoscopy  Not enough time for self care due to job

## 2018-08-18 NOTE — Assessment & Plan Note (Signed)
Lab Results  Component Value Date   HGBA1C 6.3 08/15/2018   Lab Results  Component Value Date   MICROALBUR 2.4 (H) 11/04/2017   In good control with metformin  Enc good eye and foot care Also wt loss and low glycemic diet/exercise (when time)  F/u 6 mo

## 2018-08-18 NOTE — Assessment & Plan Note (Signed)
Discussed how this problem influences overall health and the risks it imposes  Reviewed plan for weight loss with lower calorie diet (via better food choices and also portion control or program like weight watchers) and exercise building up to or more than 30 minutes 5 days per week including some aerobic activity    

## 2018-08-18 NOTE — Progress Notes (Signed)
Subjective:    Patient ID: Charles Watkins, male    DOB: 15-Jun-1966, 52 y.o.   MRN: [id number]  HPI  Here for health maintenance exam and to review chronic medical problems    Wt Readings from Last 3 Encounters:  08/18/18 (!) 323 lb 12 oz (146.9 kg)  03/19/18 (!) 325 lb 12 oz (147.8 kg)  02/12/18 (!) 327 lb 8 oz (148.6 kg)  wt is down 2 lb  Eating -really trying to eat better/ just no time (eats on the run for his job)  Exercise -little bit, a lot of walking  42.13 kg/m   PNA vaccine   Colon cancer screening - interested in colonoscopy   Flu vaccine -due   Tetanus vaccine 12/17  Prostate cancer screening  Lab Results  Component Value Date   PSA 1.58 08/15/2018   PSA 0.67 02/08/2017   PSA 1.50 08/10/2016   no family history  No urinary changes  No nocturia   bp is stable today  No cp or palpitations or headaches or edema  No side effects to medicines  BP Readings from Last 3 Encounters:  08/18/18 130/68  03/19/18 132/78  02/12/18 140/80     DM 2 Lab Results  Component Value Date   MICROALBUR 2.4 (H) 11/04/2017   Lab Results  Component Value Date   HGBA1C 6.3 08/15/2018  improved from 6.4 Metformin  utd eye exam Not a sweet eater as a rule   Hyperlipidemia Lab Results  Component Value Date   CHOL 176 08/15/2018   CHOL 184 10/28/2017   CHOL 182 02/08/2017   Lab Results  Component Value Date   HDL 44.30 08/15/2018   HDL 44.80 10/28/2017   HDL 39.90 02/08/2017   Lab Results  Component Value Date   LDLCALC 104 (H) 08/15/2018   LDLCALC 119 (H) 10/28/2017   LDLCALC 121 (H) 02/08/2017   Lab Results  Component Value Date   TRIG 137.0 08/15/2018   TRIG 97.0 10/28/2017   TRIG 105.0 02/08/2017   Lab Results  Component Value Date   CHOLHDL 4 08/15/2018   CHOLHDL 4 10/28/2017   CHOLHDL 5 02/08/2017   Lab Results  Component Value Date   LDLDIRECT 117.0 08/10/2016   LDLDIRECT 130.7 09/28/2013   LDLDIRECT 136.5 03/12/2012   on fenofibrate      Other labs Lab Results  Component Value Date   CREATININE 1.18 08/15/2018   BUN 13 08/15/2018   NA 139 08/15/2018   K 4.2 08/15/2018   CL 104 08/15/2018   CO2 26 08/15/2018   Lab Results  Component Value Date   ALT 22 08/15/2018   AST 19 08/15/2018   ALKPHOS 46 08/15/2018   BILITOT 0.3 08/15/2018   Lab Results  Component Value Date   WBC 4.9 08/15/2018   HGB 13.8 08/15/2018   HCT 41.5 08/15/2018   MCV 90.1 08/15/2018   PLT 293.0 08/15/2018   Lab Results  Component Value Date   TSH 3.51 08/15/2018     Has skin spots/dry - both lateral ankles   Patient Active Problem List   Diagnosis Date Noted  . Colon cancer screening 08/18/2018  . Left knee pain 02/10/2018  . Controlled type 2 diabetes mellitus without complication, without long-term current use of insulin ([place]) 11/04/2017  . H/O motion sickness 11/04/2017  . Screening for HIV (human immunodeficiency virus) 08/09/2015  . Prostate cancer screening 08/03/2015  . Routine general medical examination at a health care facility 03/22/2013  .  Morbid obesity ([place]) 09/23/2012  . Breast pain in male 04/23/2012  . Essential hypertension 08/14/2007  . ALLERGIC RHINITIS 08/14/2007  . BAKER'S CYST 08/14/2007  . SLEEP APNEA 08/14/2007  . HYPERCHOLESTEROLEMIA 01/16/2007  . ADVEF, DRUG/MEDICINAL/BIOLOGICAL SUBST NOS 01/16/2007   Past Medical History:  Diagnosis Date  . Allergic rhinitis   . Hyperglycemia   . Hyperlipidemia   . Hypertension    Past Surgical History:  Procedure Laterality Date  . Baker's cyst - aspirated  03/2005   Social History   Tobacco Use  . Smoking status: Former Smoker    Packs/day: 0.10    Types: Cigarettes    Last attempt to quit: 08/06/2011    Years since quitting: 7.0  . Smokeless tobacco: Never Used  . Tobacco comment: non smoker  Substance Use Topics  . Alcohol use: Yes    Alcohol/week: 0.0 standard drinks    Comment: drinks on weekend  . Drug use: No   Family History   Problem Relation Age of Onset  . Heart disease Mother   . Thyroid disease Mother   . Hypertension Mother   . Brain cancer Mother    No Known Allergies Current Outpatient Medications on File Prior to Visit  Medication Sig Dispense Refill  . aspirin 81 MG tablet Take 81 mg by mouth daily.      . cetirizine (ZYRTEC) 10 MG tablet Take 10 mg by mouth every other day.     . Multiple Vitamin (MULTIVITAMIN) tablet Take 1 tablet by mouth daily.      Marland Kitchen scopolamine (TRANSDERM-SCOP, 1.5 MG,) 1 MG/3DAYS Place 1 patch (1.5 mg total) onto the skin every 3 (three) days. 4 patch 0   No current facility-administered medications on file prior to visit.     Review of Systems  Constitutional: Negative for activity change, appetite change, fatigue, fever and unexpected weight change.  HENT: Negative for congestion, rhinorrhea, sore throat and trouble swallowing.   Eyes: Negative for pain, redness, itching and visual disturbance.  Respiratory: Negative for cough, chest tightness, shortness of breath and wheezing.   Cardiovascular: Negative for chest pain and palpitations.  Gastrointestinal: Negative for abdominal pain, blood in stool, constipation, diarrhea and nausea.  Endocrine: Negative for cold intolerance, heat intolerance, polydipsia and polyuria.  Genitourinary: Negative for difficulty urinating, dysuria, frequency and urgency.  Musculoskeletal: Negative for arthralgias, joint swelling and myalgias.  Skin: Negative for pallor and rash.       Dry skin areas bilat outer ankles   Neurological: Negative for dizziness, tremors, weakness, numbness and headaches.  Hematological: Negative for adenopathy. Does not bruise/bleed easily.  Psychiatric/Behavioral: Negative for decreased concentration and dysphoric mood. The patient is not nervous/anxious.        Objective:   Physical Exam Constitutional:      General: He is not in acute distress.    Appearance: He is well-developed. He is obese. He is not  ill-appearing.     Comments: Baseline speech impediment   HENT:     Head: Normocephalic and atraumatic.     Right Ear: Tympanic membrane and external ear normal.     Left Ear: Tympanic membrane and external ear normal.     Ears:     Comments: Scant cerumen bilat canals    Nose: Nose normal. No congestion or rhinorrhea.     Mouth/Throat:     Mouth: Mucous membranes are moist.  Eyes:     General: No scleral icterus.       Right eye: No  discharge.        Left eye: No discharge.     Conjunctiva/sclera: Conjunctivae normal.     Pupils: Pupils are equal, round, and reactive to light.  Neck:     Musculoskeletal: Normal range of motion and neck supple.     Thyroid: No thyromegaly.     Vascular: No carotid bruit or JVD.  Cardiovascular:     Rate and Rhythm: Normal rate and regular rhythm.     Pulses: Normal pulses.     Heart sounds: Normal heart sounds. No murmur. No gallop.   Pulmonary:     Effort: Pulmonary effort is normal. No respiratory distress.     Breath sounds: Normal breath sounds. No wheezing.  Chest:     Chest wall: No tenderness.  Abdominal:     General: Bowel sounds are normal. There is no distension or abdominal bruit.     Palpations: Abdomen is soft. There is no mass.     Tenderness: There is no abdominal tenderness.  Musculoskeletal:        General: No tenderness or deformity.     Right lower leg: No edema.     Left lower leg: No edema.  Lymphadenopathy:     Cervical: No cervical adenopathy.  Skin:    General: Skin is warm and dry.     Capillary Refill: Capillary refill takes less than 2 seconds.     Coloration: Skin is not pale.     Findings: No erythema or rash.     Comments: Several round areas of dry skin -post to lateral malleolus both ankles   Neurological:     General: No focal deficit present.     Mental Status: He is alert.     Cranial Nerves: No cranial nerve deficit.     Motor: No abnormal muscle tone.     Coordination: Coordination normal.      Deep Tendon Reflexes: Reflexes are normal and symmetric.  Psychiatric:        Mood and Affect: Mood normal.           Assessment & Plan:   Problem List Items Addressed This Visit      Cardiovascular and Mediastinum   Essential hypertension    bp in fair control at this time  BP Readings from Last 1 Encounters:  08/18/18 130/68   No changes needed Most recent labs reviewed  Disc lifstyle change with low sodium diet and exercise        Relevant Medications   amLODipine (NORVASC) 10 MG tablet   fenofibrate micronized (LOFIBRA) 134 MG capsule     Endocrine   Controlled type 2 diabetes mellitus without complication, without long-term current use of insulin ([facility])    Lab Results  Component Value Date   HGBA1C 6.3 08/15/2018   Lab Results  Component Value Date   MICROALBUR 2.4 (H) 11/04/2017   In good control with metformin  Enc good eye and foot care Also wt loss and low glycemic diet/exercise (when time)  F/u 6 mo       Relevant Medications   metFORMIN (GLUCOPHAGE) 500 MG tablet     Other   Routine general medical examination at a health care facility - Primary    Reviewed health habits including diet and exercise and skin cancer prevention Reviewed appropriate screening tests for age  Also reviewed health mt list, fam hx and immunization status , as well as social and family history   See HPI Labs reviewed  [facility]  loss encouraged PNA 23 and flu shots today  Referred for first screening colonoscopy  Not enough time for self care due to job      Prostate cancer screening    Lab Results  Component Value Date   PSA 1.58 08/15/2018   PSA 0.67 02/08/2017   PSA 1.50 08/10/2016   No family hx  No symptoms        Morbid obesity ([place])    Discussed how this problem influences overall health and the risks it imposes  Reviewed plan for weight loss with lower calorie diet (via better food choices and also portion control or program like weight watchers) and  exercise building up to or more than 30 minutes 5 days per week including some aerobic activity         Relevant Medications   metFORMIN (GLUCOPHAGE) 500 MG tablet   HYPERCHOLESTEROLEMIA    Disc goals for lipids and reasons to control them Rev last labs with pt Rev low sat fat diet in detail  Trig in control with fibrate  LDL is down as well (not at goal but hesitant to add statin due to possible side effects with fibrate)       Relevant Medications   amLODipine (NORVASC) 10 MG tablet   fenofibrate micronized (LOFIBRA) 134 MG capsule   Colon cancer screening    Ref for screening colonoscopy- first      Relevant Orders   Ambulatory referral to Gastroenterology

## 2018-08-18 NOTE — Assessment & Plan Note (Signed)
Lab Results  Component Value Date   PSA 1.58 08/15/2018   PSA 0.67 02/08/2017   PSA 1.50 08/10/2016   No family hx  No symptoms

## 2018-08-18 NOTE — Assessment & Plan Note (Signed)
bp in fair control at this time  BP Readings from Last 1 Encounters:  08/18/18 130/68   No changes needed Most recent labs reviewed  Disc lifstyle change with low sodium diet and exercise

## 2018-08-18 NOTE — Assessment & Plan Note (Signed)
Ref for screening colonoscopy- first

## 2018-08-18 NOTE — Assessment & Plan Note (Signed)
Disc goals for lipids and reasons to control them Rev last labs with pt Rev low sat fat diet in detail  Trig in control with fibrate  LDL is down as well (not at goal but hesitant to add statin due to possible side effects with fibrate)

## 2018-09-16 ENCOUNTER — Ambulatory Visit (AMBULATORY_SURGERY_CENTER): Payer: Self-pay | Admitting: *Deleted

## 2018-09-16 VITALS — Ht 73.5 in | Wt 330.6 lb

## 2018-09-16 DIAGNOSIS — Z1211 Encounter for screening for malignant neoplasm of colon: Secondary | ICD-10-CM

## 2018-09-16 MED ORDER — NA SULFATE-K SULFATE-MG SULF 17.5-3.13-1.6 GM/177ML PO SOLN: 1.0000 | Freq: Once | ORAL | 0 refills | Status: AC

## 2018-09-16 NOTE — Progress Notes (Signed)
No egg or soy allergy known to patient  No issues with past sedation with any surgeries  or procedures, no intubation problems  No diet pills per patient No home 02 use per patient  No blood thinners per patient  Pt denies issues with constipation  No A fib or A flutter  EMMI video sent to pt's e mail -  $15 Suprep coupon to pt

## 2018-09-18 ENCOUNTER — Encounter: Payer: Self-pay | Admitting: Internal Medicine

## 2018-09-29 ENCOUNTER — Encounter: Payer: Self-pay | Admitting: Internal Medicine

## 2018-09-29 ENCOUNTER — Ambulatory Visit (AMBULATORY_SURGERY_CENTER): Payer: BLUE CROSS/BLUE SHIELD | Admitting: Internal Medicine

## 2018-09-29 VITALS — BP 150/80 | HR 84 | Temp 98.6°F | Resp 16 | Ht 73.5 in | Wt 323.0 lb

## 2018-09-29 DIAGNOSIS — D123 Benign neoplasm of transverse colon: Secondary | ICD-10-CM

## 2018-09-29 DIAGNOSIS — D122 Benign neoplasm of ascending colon: Secondary | ICD-10-CM | POA: Diagnosis not present

## 2018-09-29 DIAGNOSIS — Z1211 Encounter for screening for malignant neoplasm of colon: Secondary | ICD-10-CM | POA: Diagnosis not present

## 2018-09-29 MED ORDER — SODIUM CHLORIDE 0.9 % IV SOLN: 500.0000 mL | Freq: Once | INTRAVENOUS | Status: DC

## 2018-09-29 NOTE — Op Note (Signed)
[facility] [place] Patient Name: Charles Watkins Procedure Date: 09/29/2018 8:42 AM MRN: [id number] Endoscopist: Docia Chuck. Henrene Pastor , MD Age: 53 Referring MD:  Date of Birth: Jul 05, 1966 Gender: Male Account #: [id number] Procedure:                Colonoscopy with cold snare polypectomy x 3 Indications:              Screening for colorectal malignant neoplasm Medicines:                Monitored Anesthesia Care Procedure:                Pre-Anesthesia Assessment:                           - Prior to the procedure, a History and Physical                            was performed, and patient medications and                            allergies were reviewed. The patient's tolerance of                            previous anesthesia was also reviewed. The risks                            and benefits of the procedure and the sedation                            options and risks were discussed with the patient.                            All questions were answered, and informed consent                            was obtained. Prior Anticoagulants: The patient has                            taken no previous anticoagulant or antiplatelet                            agents. ASA Grade Assessment: II - A patient with                            mild systemic disease. After reviewing the risks                            and benefits, the patient was deemed in                            satisfactory condition to undergo the procedure.                           After obtaining informed consent, the colonoscope  was passed under direct vision. Throughout the                            procedure, the patient's blood pressure, pulse, and                            oxygen saturations were monitored continuously. The                            Colonoscope was introduced through the anus and                            advanced to the the cecum, identified by      appendiceal orifice and ileocecal valve. The                            ileocecal valve, appendiceal orifice, and rectum                            were photographed. The quality of the bowel                            preparation was good. The colonoscopy was performed                            without difficulty. The patient tolerated the                            procedure well. The bowel preparation used was                            SUPREP. Scope In: 8:52:21 AM Scope Out: 9:11:02 AM Scope Withdrawal Time: 0 hours 15 minutes 2 seconds  Total Procedure Duration: 0 hours 18 minutes 41 seconds  Findings:                 Three polyps were found in the transverse colon and                            ascending colon. The polyps were 1 to 4 mm in size.                            These polyps were removed with a cold snare.                            Resection and retrieval were complete.                           Multiple diverticula were found in the sigmoid                            colon.                           Internal hemorrhoids were found during  retroflexion. The hemorrhoids were small.                           The exam was otherwise without abnormality on                            direct and retroflexion views. Complications:            No immediate complications. Estimated blood loss:                            None. Estimated Blood Loss:     Estimated blood loss: none. Impression:               - Three 1 to 4 mm polyps in the transverse colon                            and in the ascending colon, removed with a cold                            snare. Resected and retrieved.                           - Diverticulosis in the sigmoid colon.                           - Internal hemorrhoids.                           - The examination was otherwise normal on direct                            and retroflexion views. Recommendation:           -  Repeat colonoscopy in 5 years for surveillance.                           - Patient has a contact number available for                            emergencies. The signs and symptoms of potential                            delayed complications were discussed with the                            patient. Return to normal activities tomorrow.                            Written discharge instructions were provided to the                            patient.                           - Resume previous diet.                           -  Continue present medications.                           - Await pathology results. Docia Chuck. Henrene Pastor, MD 09/29/2018 9:16:41 AM This report has been signed electronically.

## 2018-09-29 NOTE — Progress Notes (Signed)
Pt's states no medical or surgical changes since previsit or office visit. 

## 2018-09-29 NOTE — Progress Notes (Signed)
Report to PACU, RN, vss, BBS= Clear.  

## 2018-09-29 NOTE — Patient Instructions (Signed)
Impression/Recommendations:  Polyp handout given to patient. Diverticulosis handout given to patient. Hemorrhoid handout given to patient.  Repeat colonoscopy in 5 years for surveillance.  Resume previous diet. Continue present medications.  YOU HAD AN ENDOSCOPIC PROCEDURE TODAY AT [facility]:   Refer to the procedure report that was given to you for any specific questions about what was found during the examination.  If the procedure report does not answer your questions, please call your gastroenterologist to clarify.  If you requested that your care partner not be given the details of your procedure findings, then the procedure report has been included in a sealed envelope for you to review at your convenience later.  YOU SHOULD EXPECT: Some feelings of bloating in the abdomen. Passage of more gas than usual.  Walking can help get rid of the air that was put into your GI tract during the procedure and reduce the bloating. If you had a lower endoscopy (such as a colonoscopy or flexible sigmoidoscopy) you may notice spotting of blood in your stool or on the toilet paper. If you underwent a bowel prep for your procedure, you may not have a normal bowel movement for a few days.  Please Note:  You might notice some irritation and congestion in your nose or some drainage.  This is from the oxygen used during your procedure.  There is no need for concern and it should clear up in a day or so.  SYMPTOMS TO REPORT IMMEDIATELY:   Following lower endoscopy (colonoscopy or flexible sigmoidoscopy):  Excessive amounts of blood in the stool  Significant tenderness or worsening of abdominal pains  Swelling of the abdomen that is new, acute  Fever of 100F or higher  For urgent or emergent issues, a gastroenterologist can be reached at any hour by calling [phone].   DIET:  We do recommend a small meal at first, but then you may proceed to your regular diet.  Drink plenty of  fluids but you should avoid alcoholic beverages for 24 hours.  ACTIVITY:  You should plan to take it easy for the rest of today and you should NOT DRIVE or use heavy machinery until tomorrow (because of the sedation medicines used during the test).    FOLLOW UP: Our staff will call the number listed on your records the next business day following your procedure to check on you and address any questions or concerns that you may have regarding the information given to you following your procedure. If we do not reach you, we will leave a message.  However, if you are feeling well and you are not experiencing any problems, there is no need to return our call.  We will assume that you have returned to your regular daily activities without incident.  If any biopsies were taken you will be contacted by phone or by letter within the next 1-3 weeks.  Please call us at [phone] if you have not heard about the biopsies in 3 weeks.    SIGNATURES/CONFIDENTIALITY: You and/or your care partner have signed paperwork which will be entered into your electronic medical record.  These signatures attest to the fact that that the information above on your After Visit Summary has been reviewed and is understood.  Full responsibility of the confidentiality of this discharge information lies with you and/or your care-partner.

## 2018-09-29 NOTE — Progress Notes (Signed)
Called to room to assist during endoscopic procedure.  Patient ID and intended procedure confirmed with present staff. Received instructions for my participation in the procedure from the performing physician.  

## 2018-09-30 ENCOUNTER — Telehealth: Payer: Self-pay | Admitting: *Deleted

## 2018-09-30 NOTE — Telephone Encounter (Signed)
  Follow up Call-  Call back number 09/29/2018  Post procedure Call Back phone  # [phone]  Permission to leave phone message Yes  Some recent data might be hidden     Patient questions:  Do you have a fever, pain , or abdominal swelling? No. Pain Score  0 *  Have you tolerated food without any problems? Yes.    Have you been able to return to your normal activities? Yes.    Do you have any questions about your discharge instructions: Diet   No. Medications  No. Follow up visit  No.  Do you have questions or concerns about your Care? No.  Actions: * If pain score is 4 or above: No action needed, pain <4.

## 2018-10-02 ENCOUNTER — Encounter: Payer: Self-pay | Admitting: Internal Medicine

## 2019-02-12 ENCOUNTER — Other Ambulatory Visit (INDEPENDENT_AMBULATORY_CARE_PROVIDER_SITE_OTHER): Payer: BC Managed Care – PPO

## 2019-02-12 ENCOUNTER — Other Ambulatory Visit: Payer: Self-pay

## 2019-02-12 DIAGNOSIS — I10 Essential (primary) hypertension: Secondary | ICD-10-CM

## 2019-02-12 DIAGNOSIS — E7800 Pure hypercholesterolemia, unspecified: Secondary | ICD-10-CM

## 2019-02-12 DIAGNOSIS — E119 Type 2 diabetes mellitus without complications: Secondary | ICD-10-CM

## 2019-02-12 LAB — HEMOGLOBIN A1C: Hgb A1c MFr Bld: 6.9 % — ABNORMAL HIGH (ref 4.6–6.5)

## 2019-02-12 LAB — LIPID PANEL
Cholesterol: 188 mg/dL (ref 0–200)
HDL: 40.7 mg/dL (ref >39.00)
LDL Cholesterol: 122 mg/dL — ABNORMAL HIGH (ref 0–99)
NonHDL: 147.57
Total CHOL/HDL Ratio: 5
Triglycerides: 129 mg/dL (ref 0.0–149.0)
VLDL: 25.8 mg/dL (ref 0.0–40.0)

## 2019-02-12 LAB — COMPREHENSIVE METABOLIC PANEL
ALT: 51 U/L (ref 0–53)
AST: 31 U/L (ref 0–37)
Albumin: 4.1 g/dL (ref 3.5–5.2)
Alkaline Phosphatase: 47 U/L (ref 39–117)
BUN: 15 mg/dL (ref 6–23)
CO2: 27 mEq/L (ref 19–32)
Calcium: 9.3 mg/dL (ref 8.4–10.5)
Chloride: 105 mEq/L (ref 96–112)
Creatinine, Ser: 1.15 mg/dL (ref 0.40–1.50)
GFR: 80.84 mL/min (ref >60.00)
Glucose, Bld: 116 mg/dL — ABNORMAL HIGH (ref 70–99)
Potassium: 4 mEq/L (ref 3.5–5.1)
Sodium: 139 mEq/L (ref 135–145)
Total Bilirubin: 0.4 mg/dL (ref 0.2–1.2)
Total Protein: 7.2 g/dL (ref 6.0–8.3)

## 2019-02-16 ENCOUNTER — Other Ambulatory Visit: Payer: Self-pay

## 2019-02-16 ENCOUNTER — Encounter: Payer: Self-pay | Admitting: Family Medicine

## 2019-02-16 ENCOUNTER — Ambulatory Visit (INDEPENDENT_AMBULATORY_CARE_PROVIDER_SITE_OTHER): Payer: BC Managed Care – PPO | Admitting: Family Medicine

## 2019-02-16 VITALS — BP 148/82 | HR 90 | Temp 97.1°F | Ht 73.5 in | Wt 337.3 lb

## 2019-02-16 DIAGNOSIS — E7800 Pure hypercholesterolemia, unspecified: Secondary | ICD-10-CM | POA: Diagnosis not present

## 2019-02-16 DIAGNOSIS — I10 Essential (primary) hypertension: Secondary | ICD-10-CM

## 2019-02-16 DIAGNOSIS — E119 Type 2 diabetes mellitus without complications: Secondary | ICD-10-CM | POA: Diagnosis not present

## 2019-02-16 MED ORDER — LISINOPRIL 10 MG PO TABS: 10.0000 mg | ORAL_TABLET | Freq: Every day | ORAL | 5 refills | Status: DC

## 2019-02-16 NOTE — Assessment & Plan Note (Signed)
Disc goals for lipids and reasons to control them Rev last labs with pt Rev low sat fat diet in detail Trig in control with fibrate but LDL continues to go up  May need to start statin in the future (was trying to avoid due to side eff poss)- may be able to try twice weekly  Long disc re: diet-cutting trans /sat fats

## 2019-02-16 NOTE — Patient Instructions (Addendum)
Please start exercising every day  Walk after work- 30 or more minutes  Do any exercise you can at home as well - resistance training/machine  Push ups/ sit ups etc   Diabetes is worse- please work on diet and exercise !   We will do a referral for an eye exam -the office will call you about that   For diabetes Try to get most of your carbohydrates from produce (with the exception of white potatoes)  Eat less bread/pasta/rice/snack foods/cereals/sweets and other items from the middle of the grocery store (processed carbs)  For cholesterol  Avoid red meat/ fried foods/ egg yolks/ fatty breakfast meats/ butter, cheese and high fat dairy/ and shellfish    Start lisinopril 10 mg once daily for blood pressure /this also protects your kidneys from diabetes  If any side effects please stop it and let me know    Make a follow up appointment in 3 months please

## 2019-02-16 NOTE — Progress Notes (Signed)
Subjective:    Patient ID: Charles Watkins, male    DOB: Dec 08, 1965, 53 y.o.   MRN: [id number]  HPI  Here for 39mo f/u of chronic health problems   Weight  Wt Readings from Last 3 Encounters:  02/16/19 (!) 337 lb 5 oz (153 kg)  09/29/18 (!) 323 lb (146.5 kg)  09/16/18 (!) 330 lb 9.6 oz (150 kg)  has not been exercising because he could not go anywhere  Thought he was eating ok ?  43.90 kg/m   He does have a place to walk   Drinking a lot of coffee with cream (no sugar)    Hypertension  bp is stable today  No cp or palpitations or headaches or edema  No side effects to medicines  BP Readings from Last 3 Encounters:  02/16/19 (!) 148/82  09/29/18 (!) 150/80  08/18/18 130/68     Amlodipine    DM2 Home sugar results  DM diet =come cookies /a lot of them  Avoids bread/eats too much rice  Grapes/ fruit  Protein-chicken/steak /pork chops  Exercise -none  Symptoms-none  A1C last  Lab Results  Component Value Date   HGBA1C 6.9 (H) 02/12/2019  this is up from 6.3 in December Lab Results  Component Value Date   MICROALBUR 2.4 (H) 11/04/2017    No problems with medications  Renal protection-none  Last eye exam  4/19  - is due for one now   Foot exam -no problems   Hyperlipidemia H/o high triglycerides-on fenofibrate Lab Results  Component Value Date   CHOL 188 02/12/2019   CHOL 176 08/15/2018   CHOL 184 10/28/2017   Lab Results  Component Value Date   HDL 40.70 02/12/2019   HDL 44.30 08/15/2018   HDL 44.80 10/28/2017   Lab Results  Component Value Date   LDLCALC 122 (H) 02/12/2019   LDLCALC 104 (H) 08/15/2018   LDLCALC 119 (H) 10/28/2017   Lab Results  Component Value Date   TRIG 129.0 02/12/2019   TRIG 137.0 08/15/2018   TRIG 97.0 10/28/2017   Lab Results  Component Value Date   CHOLHDL 5 02/12/2019   CHOLHDL 4 08/15/2018   CHOLHDL 4 10/28/2017   Lab Results  Component Value Date   LDLDIRECT 117.0 08/10/2016   LDLDIRECT 130.7  09/28/2013   LDLDIRECT 136.5 03/12/2012   Hesitant to add statin due to presence of fibrate  No fried food  Diet has been bad  Cream in coffee  He does eat shrimp   Patient Active Problem List   Diagnosis Date Noted  . Colon cancer screening 08/18/2018  . Left knee pain 02/10/2018  . Controlled type 2 diabetes mellitus without complication, without long-term current use of insulin ([place]) 11/04/2017  . H/O motion sickness 11/04/2017  . Screening for HIV (human immunodeficiency virus) 08/09/2015  . Prostate cancer screening 08/03/2015  . Routine general medical examination at a health care facility 03/22/2013  . Morbid obesity ([place]) 09/23/2012  . Breast pain in male 04/23/2012  . Essential hypertension 08/14/2007  . ALLERGIC RHINITIS 08/14/2007  . BAKER'S CYST 08/14/2007  . SLEEP APNEA 08/14/2007  . HYPERCHOLESTEROLEMIA 01/16/2007  . ADVEF, DRUG/MEDICINAL/BIOLOGICAL SUBST NOS 01/16/2007   Past Medical History:  Diagnosis Date  . Allergic rhinitis   . Allergy   . Arthritis    knee  . Diabetes mellitus without complication ([place])   . GERD (gastroesophageal reflux disease)   . Hyperglycemia   . Hyperlipidemia   . Hypertension  Past Surgical History:  Procedure Laterality Date  . Baker's cyst - aspirated  03/2005  . KNEE ARTHROSCOPY    . WISDOM TOOTH EXTRACTION     Social History   Tobacco Use  . Smoking status: Former Smoker    Packs/day: 0.10    Types: Cigarettes    Quit date: 08/06/2011    Years since quitting: 7.5  . Smokeless tobacco: Current User    Types: Chew  . Tobacco comment: non smoker  Substance Use Topics  . Alcohol use: Yes    Alcohol/week: 0.0 standard drinks    Comment: drinks on weekend  . Drug use: No   Family History  Problem Relation Age of Onset  . Heart disease Mother   . Thyroid disease Mother   . Hypertension Mother   . Brain cancer Mother   . Colon cancer Neg Hx   . Colon polyps Neg Hx   . Esophageal cancer Neg Hx   . Rectal  cancer Neg Hx   . Stomach cancer Neg Hx    No Known Allergies Current Outpatient Medications on File Prior to Visit  Medication Sig Dispense Refill  . amLODipine (NORVASC) 10 MG tablet Take 1 tablet (10 mg total) by mouth daily. 90 tablet 3  . aspirin 81 MG tablet Take 81 mg by mouth daily.      . cetirizine (ZYRTEC) 10 MG tablet Take 10 mg by mouth every other day.     . esomeprazole (NEXIUM) 20 MG capsule Take 1 capsule (20 mg total) by mouth as needed. 90 capsule 3  . fenofibrate micronized (LOFIBRA) 134 MG capsule Take 1 capsule (134 mg total) by mouth daily. 90 capsule 3  . metFORMIN (GLUCOPHAGE) 500 MG tablet Take 1 tablet (500 mg total) by mouth 2 (two) times daily with a meal. 180 tablet 3  . Multiple Vitamin (MULTIVITAMIN) tablet Take 1 tablet by mouth daily.       No current facility-administered medications on file prior to visit.     Review of Systems  Constitutional: Negative for activity change, appetite change, fatigue, fever and unexpected weight change.  HENT: Negative for congestion, rhinorrhea, sore throat and trouble swallowing.   Eyes: Negative for pain, redness, itching and visual disturbance.  Respiratory: Negative for cough, chest tightness, shortness of breath and wheezing.   Cardiovascular: Negative for chest pain and palpitations.       Occ gets puffy ankles at work  Gastrointestinal: Negative for abdominal pain, blood in stool, constipation, diarrhea and nausea.  Endocrine: Negative for cold intolerance, heat intolerance, polydipsia and polyuria.  Genitourinary: Negative for difficulty urinating, dysuria, frequency and urgency.  Musculoskeletal: Negative for arthralgias, joint swelling and myalgias.  Skin: Negative for pallor and rash.  Neurological: Negative for dizziness, tremors, weakness, numbness and headaches.  Hematological: Negative for adenopathy. Does not bruise/bleed easily.  Psychiatric/Behavioral: Negative for decreased concentration and  dysphoric mood. The patient is not nervous/anxious.        Objective:   Physical Exam Constitutional:      General: He is not in acute distress.    Appearance: He is well-developed. He is obese. He is not ill-appearing.     Comments: Morbidly obese Wt gain noted  HENT:     Head: Normocephalic and atraumatic.     Mouth/Throat:     Mouth: Mucous membranes are moist.     Pharynx: Oropharynx is clear.     Comments: Baseline speech impairment Eyes:     Conjunctiva/sclera: Conjunctivae normal.  Pupils: Pupils are equal, round, and reactive to light.  Neck:     Musculoskeletal: Normal range of motion and neck supple.     Thyroid: No thyromegaly.     Vascular: No carotid bruit or JVD.  Cardiovascular:     Rate and Rhythm: Normal rate and regular rhythm.     Heart sounds: Normal heart sounds. No gallop.   Pulmonary:     Effort: Pulmonary effort is normal. No respiratory distress.     Breath sounds: Normal breath sounds. No wheezing or rales.  Abdominal:     General: Bowel sounds are normal. There is no distension or abdominal bruit.     Palpations: Abdomen is soft. There is no mass.     Tenderness: There is no abdominal tenderness.  Musculoskeletal:     Right lower leg: No edema.     Left lower leg: No edema.  Lymphadenopathy:     Cervical: No cervical adenopathy.  Skin:    General: Skin is warm and dry.     Findings: No rash.  Neurological:     Mental Status: He is alert.     Coordination: Coordination normal.     Deep Tendon Reflexes: Reflexes are normal and symmetric. Reflexes normal.  Psychiatric:        Mood and Affect: Mood normal.           Assessment & Plan:   Problem List Items Addressed This Visit      Cardiovascular and Mediastinum   Essential hypertension    bp in fair control at this time -but not at goal BP Readings from Last 1 Encounters:  02/16/19 (!) 148/82   Will add lisinopril for HTN and also renal protection for DM Most recent labs  reviewed  Disc lifstyle change with low sodium diet and exercise  Weight loss strongly encouraged       Relevant Medications   lisinopril (ZESTRIL) 10 MG tablet     Endocrine   Controlled type 2 diabetes mellitus without complication, without long-term current use of insulin (HCC) - Primary    Lab Results  Component Value Date   HGBA1C 6.9 (H) 02/12/2019   This is up  Eating poorly while out of work for pandemic Outlined a plan to cut processed carb/sweets and loose wt  Ref for eye exam  Foot care is good Started ace for renal protection  May consider statin (trying to avoid with fibrate so far)  He declines DM teaching at this time F/u 3 mo      Relevant Medications   lisinopril (ZESTRIL) 10 MG tablet   Other Relevant Orders   Ambulatory referral to Ophthalmology     Other   HYPERCHOLESTEROLEMIA    Disc goals for lipids and reasons to control them Rev last labs with pt Rev low sat fat diet in detail Trig in control with fibrate but LDL continues to go up  May need to start statin in the future (was trying to avoid due to side eff poss)- may be able to try twice weekly  Long disc re: diet-cutting trans /sat fats       Relevant Medications   lisinopril (ZESTRIL) 10 MG tablet   Morbid obesity ([place])    Significant wt gain over the pandemic while home Discussed how this problem influences overall health and the risks it imposes  Reviewed plan for weight loss with lower calorie diet (via better food choices and also portion control or program like weight watchers) and exercise  building up to or more than 30 minutes 5 days per week including some aerobic activity   Disc plan for exercise-walking and resistance training  Also better diet - lower carb and fat F/u 3 mo

## 2019-02-16 NOTE — Assessment & Plan Note (Addendum)
Lab Results  Component Value Date   HGBA1C 6.9 (H) 02/12/2019   This is up  Eating poorly while out of work for pandemic Outlined a plan to cut processed carb/sweets and loose wt  Ref for eye exam  Foot care is good Started ace for renal protection  May consider statin (trying to avoid with fibrate so far)  He declines DM teaching at this time F/u 3 mo

## 2019-02-16 NOTE — Assessment & Plan Note (Signed)
bp in fair control at this time -but not at goal BP Readings from Last 1 Encounters:  02/16/19 (!) 148/82   Will add lisinopril for HTN and also renal protection for DM Most recent labs reviewed  Disc lifstyle change with low sodium diet and exercise  Weight loss strongly encouraged

## 2019-02-16 NOTE — Assessment & Plan Note (Signed)
Significant wt gain over the pandemic while home Discussed how this problem influences overall health and the risks it imposes  Reviewed plan for weight loss with lower calorie diet (via better food choices and also portion control or program like weight watchers) and exercise building up to or more than 30 minutes 5 days per week including some aerobic activity   Disc plan for exercise-walking and resistance training  Also better diet - lower carb and fat F/u 3 mo

## 2019-03-05 LAB — HM DIABETES EYE EXAM

## 2019-05-12 ENCOUNTER — Ambulatory Visit (INDEPENDENT_AMBULATORY_CARE_PROVIDER_SITE_OTHER): Payer: BC Managed Care – PPO

## 2019-05-12 ENCOUNTER — Encounter: Payer: Self-pay | Admitting: Family Medicine

## 2019-05-12 ENCOUNTER — Ambulatory Visit: Payer: BC Managed Care – PPO

## 2019-05-12 DIAGNOSIS — Z23 Encounter for immunization: Secondary | ICD-10-CM | POA: Diagnosis not present

## 2019-05-18 ENCOUNTER — Ambulatory Visit: Payer: BC Managed Care – PPO | Admitting: Family Medicine

## 2019-05-18 ENCOUNTER — Encounter: Payer: Self-pay | Admitting: Family Medicine

## 2019-05-18 ENCOUNTER — Other Ambulatory Visit: Payer: Self-pay

## 2019-05-18 VITALS — BP 144/86 | HR 84 | Temp 97.3°F | Ht 73.5 in | Wt 310.5 lb

## 2019-05-18 DIAGNOSIS — J301 Allergic rhinitis due to pollen: Secondary | ICD-10-CM

## 2019-05-18 DIAGNOSIS — I10 Essential (primary) hypertension: Secondary | ICD-10-CM

## 2019-05-18 DIAGNOSIS — E1169 Type 2 diabetes mellitus with other specified complication: Secondary | ICD-10-CM | POA: Diagnosis not present

## 2019-05-18 DIAGNOSIS — E785 Hyperlipidemia, unspecified: Secondary | ICD-10-CM

## 2019-05-18 DIAGNOSIS — E119 Type 2 diabetes mellitus without complications: Secondary | ICD-10-CM

## 2019-05-18 LAB — POCT GLYCOSYLATED HEMOGLOBIN (HGB A1C): Hemoglobin A1C: 5.6 % (ref 4.0–5.6)

## 2019-05-18 MED ORDER — LOSARTAN POTASSIUM 50 MG PO TABS: 50.0000 mg | ORAL_TABLET | Freq: Every day | ORAL | 11 refills | Status: DC

## 2019-05-18 NOTE — Progress Notes (Signed)
Subjective:    Patient ID: Charles Watkins, male    DOB: December 08, 1965, 53 y.o.   MRN: [id number]  HPI  Here for f/u of chronic health problems   Wt Readings from Last 3 Encounters:  05/18/19 (!) 310 lb 8 oz (140.8 kg)  02/16/19 (!) 337 lb 5 oz (153 kg)  09/29/18 (!) 323 lb (146.5 kg)  lost significant weight  Avoiding fried foods and white potato and starchy food  He feels better  40.41 kg/m   Allergies bother him - hay fever  Takes zyrtec    bp is stable today -still elevated  No cp or palpitations or headaches or edema  No side effects to medicines  BP Readings from Last 3 Encounters:  05/18/19 (!) 144/86  02/16/19 (!) 148/82  09/29/18 (!) 150/80    Added lisinopril last time -made him cough    DM2 Lab Results  Component Value Date   HGBA1C 6.9 (H) 02/12/2019   Due for A1C today Was previously eating poorly while out of work Eye exam -recent/nl will send for report  Could not tolerate ace/cough- will try losartan Not on statin -will discuss next time  Lab Results  Component Value Date   HGBA1C 5.6 05/18/2019  improved!   Had flu shot earlier this month   Hyperlipidemia Lab Results  Component Value Date   CHOL 188 02/12/2019   HDL 40.70 02/12/2019   LDLCALC 122 (H) 02/12/2019   LDLDIRECT 117.0 08/10/2016   TRIG 129.0 02/12/2019   CHOLHDL 5 02/12/2019  takes fenofibrate but not statin   Will disc at next visit  Diet is better   Patient Active Problem List   Diagnosis Date Noted  . Colon cancer screening 08/18/2018  . Left knee pain 02/10/2018  . Controlled type 2 diabetes mellitus without complication, without long-term current use of insulin ([place]) 11/04/2017  . H/O motion sickness 11/04/2017  . Screening for HIV (human immunodeficiency virus) 08/09/2015  . Prostate cancer screening 08/03/2015  . Routine general medical examination at a health care facility 03/22/2013  . Morbid obesity ([place]) 09/23/2012  . Breast pain in male 04/23/2012  .  Essential hypertension 08/14/2007  . Allergic rhinitis 08/14/2007  . BAKER'S CYST 08/14/2007  . SLEEP APNEA 08/14/2007  . HYPERCHOLESTEROLEMIA 01/16/2007  . ADVEF, DRUG/MEDICINAL/BIOLOGICAL SUBST NOS 01/16/2007   Past Medical History:  Diagnosis Date  . Allergic rhinitis   . Allergy   . Arthritis    knee  . Diabetes mellitus without complication ([place])   . GERD (gastroesophageal reflux disease)   . Hyperglycemia   . Hyperlipidemia   . Hypertension    Past Surgical History:  Procedure Laterality Date  . Baker's cyst - aspirated  03/2005  . KNEE ARTHROSCOPY    . WISDOM TOOTH EXTRACTION     Social History   Tobacco Use  . Smoking status: Former Smoker    Packs/day: 0.10    Types: Cigarettes    Quit date: 08/06/2011    Years since quitting: 7.7  . Smokeless tobacco: Current User    Types: Chew  . Tobacco comment: non smoker  Substance Use Topics  . Alcohol use: Yes    Alcohol/week: 0.0 standard drinks    Comment: drinks on weekend  . Drug use: No   Family History  Problem Relation Age of Onset  . Heart disease Mother   . Thyroid disease Mother   . Hypertension Mother   . Brain cancer Mother   . Colon cancer Neg  Hx   . Colon polyps Neg Hx   . Esophageal cancer Neg Hx   . Rectal cancer Neg Hx   . Stomach cancer Neg Hx    Allergies  Allergen Reactions  . Lisinopril Cough   Current Outpatient Medications on File Prior to Visit  Medication Sig Dispense Refill  . amLODipine (NORVASC) 10 MG tablet Take 1 tablet (10 mg total) by mouth daily. 90 tablet 3  . aspirin 81 MG tablet Take 81 mg by mouth daily.      . cetirizine (ZYRTEC) 10 MG tablet Take 10 mg by mouth every other day.     . esomeprazole (NEXIUM) 20 MG capsule Take 1 capsule (20 mg total) by mouth as needed. 90 capsule 3  . fenofibrate micronized (LOFIBRA) 134 MG capsule Take 1 capsule (134 mg total) by mouth daily. 90 capsule 3  . metFORMIN (GLUCOPHAGE) 500 MG tablet Take 1 tablet (500 mg total) by mouth 2  (two) times daily with a meal. 180 tablet 3  . Multiple Vitamin (MULTIVITAMIN) tablet Take 1 tablet by mouth daily.       No current facility-administered medications on file prior to visit.     Review of Systems  Constitutional: Negative for activity change, appetite change, fatigue, fever and unexpected weight change.  HENT: Positive for postnasal drip, rhinorrhea and sneezing. Negative for congestion, sore throat and trouble swallowing.        Nasal allergies-in season  Eyes: Negative for pain, redness, itching and visual disturbance.  Respiratory: Negative for cough, chest tightness, shortness of breath and wheezing.   Cardiovascular: Negative for chest pain and palpitations.  Gastrointestinal: Negative for abdominal pain, blood in stool, constipation, diarrhea and nausea.  Endocrine: Negative for cold intolerance, heat intolerance, polydipsia and polyuria.  Genitourinary: Negative for difficulty urinating, dysuria, frequency and urgency.  Musculoskeletal: Negative for arthralgias, joint swelling and myalgias.  Skin: Negative for pallor and rash.  Neurological: Negative for dizziness, tremors, weakness, numbness and headaches.  Hematological: Negative for adenopathy. Does not bruise/bleed easily.  Psychiatric/Behavioral: Negative for decreased concentration and dysphoric mood. The patient is not nervous/anxious.        Objective:   Physical Exam Constitutional:      General: He is not in acute distress.    Appearance: Normal appearance. He is well-developed. He is obese. He is not ill-appearing.  HENT:     Head: Normocephalic and atraumatic.     Nose:     Comments: sniffling    Mouth/Throat:     Mouth: Mucous membranes are moist.  Eyes:     Conjunctiva/sclera: Conjunctivae normal.     Pupils: Pupils are equal, round, and reactive to light.  Neck:     Musculoskeletal: Normal range of motion and neck supple.     Thyroid: No thyromegaly.     Vascular: No carotid bruit or JVD.   Cardiovascular:     Rate and Rhythm: Normal rate and regular rhythm.     Heart sounds: Normal heart sounds. No gallop.   Pulmonary:     Effort: Pulmonary effort is normal. No respiratory distress.     Breath sounds: Normal breath sounds. No wheezing or rales.  Abdominal:     General: Bowel sounds are normal. There is no distension or abdominal bruit.     Palpations: Abdomen is soft. There is no mass.     Tenderness: There is no abdominal tenderness.  Musculoskeletal:     Right lower leg: No edema.  Left lower leg: No edema.  Lymphadenopathy:     Cervical: No cervical adenopathy.  Skin:    General: Skin is warm and dry.     Findings: No rash.  Neurological:     Mental Status: He is alert. Mental status is at baseline.     Sensory: No sensory deficit.     Deep Tendon Reflexes: Reflexes are normal and symmetric. Reflexes normal.  Psychiatric:        Mood and Affect: Mood normal.           Assessment & Plan:   Problem List Items Addressed This Visit      Cardiovascular and Mediastinum   Essential hypertension    BP: (!) 144/86    Unable to tolerate ace (cough) Arb -losartan 50 mg px  Disc poss side eff-call if issues Disc goal bp  Better diet with wt loss-commended F/u dec       Relevant Medications   losartan (COZAAR) 50 MG tablet     Respiratory   Allergic rhinitis    Disc possible change to different otc antihistamine Also adv addn of steroid ns otc  Allergen avoidance        Endocrine   Hyperlipidemia associated with type 2 diabetes mellitus ([place])    With high trig and mildly high LDL  On fibrate Disc goals for lipids and reasons to control them Rev last labs with pt Rev low sat fat diet in detail Will disc starting statin at next visit       Relevant Medications   losartan (COZAAR) 50 MG tablet   Controlled type 2 diabetes mellitus without complication, without long-term current use of insulin (HCC) - Primary    Lab Results  Component  Value Date   HGBA1C 5.6 05/18/2019   Much improved with better diet/exercise and also wt loss  Arb started Will discuss statin at next visit  Enc further good habits No change in metformin  F/u dec  Sent for DM eye exam report (recent)      Relevant Medications   losartan (COZAAR) 50 MG tablet   Other Relevant Orders   POCT glycosylated hemoglobin (Hb A1C) (Completed)     Other   Morbid obesity (HCC)    Discussed how this problem influences overall health and the risks it imposes  Reviewed plan for weight loss with lower calorie diet (via better food choices and also portion control or program like weight watchers) and exercise building up to or more than 30 minutes 5 days per week including some aerobic activity   Commended on excellent wt loss so far

## 2019-05-18 NOTE — Assessment & Plan Note (Signed)
Discussed how this problem influences overall health and the risks it imposes  Reviewed plan for weight loss with lower calorie diet (via better food choices and also portion control or program like weight watchers) and exercise building up to or more than 30 minutes 5 days per week including some aerobic activity   Commended on excellent wt loss so far

## 2019-05-18 NOTE — Assessment & Plan Note (Signed)
Disc possible change to different otc antihistamine Also adv addn of steroid ns otc  Allergen avoidance

## 2019-05-18 NOTE — Assessment & Plan Note (Signed)
BP: (!) 144/86    Unable to tolerate ace (cough) Arb -losartan 50 mg px  Disc poss side eff-call if issues Disc goal bp  Better diet with wt loss-commended F/u dec

## 2019-05-18 NOTE — Assessment & Plan Note (Signed)
With high trig and mildly high LDL  On fibrate Disc goals for lipids and reasons to control them Rev last labs with pt Rev low sat fat diet in detail Will disc starting statin at next visit

## 2019-05-18 NOTE — Patient Instructions (Addendum)
Continue zyrtec (or allegra or claritin or xyzal)  Also a steroid nasal spray like flonase or nasacort (store brand is fine) - every day   Start losartan for blood pressure and kidney protection  This should not cause a cough like the lisinopril If any problems let us know   Stop at check out and we will send for your eye doctor report   Weight is better  Diabetes is much better  Keep up the good work!

## 2019-05-18 NOTE — Assessment & Plan Note (Signed)
Lab Results  Component Value Date   HGBA1C 5.6 05/18/2019   Much improved with better diet/exercise and also wt loss  Arb started Will discuss statin at next visit  Enc further good habits No change in metformin  F/u dec  Sent for DM eye exam report (recent)

## 2019-05-26 IMAGING — DX DG KNEE COMPLETE 4+V*L*
4 series · 4 of 4 positions shown · non-contrast
Comparison: 09/06/2010.

CLINICAL DATA: Left knee pain for 1 month, no injury.

EXAM:
LEFT KNEE - COMPLETE 4+ VIEW

[knee ap]
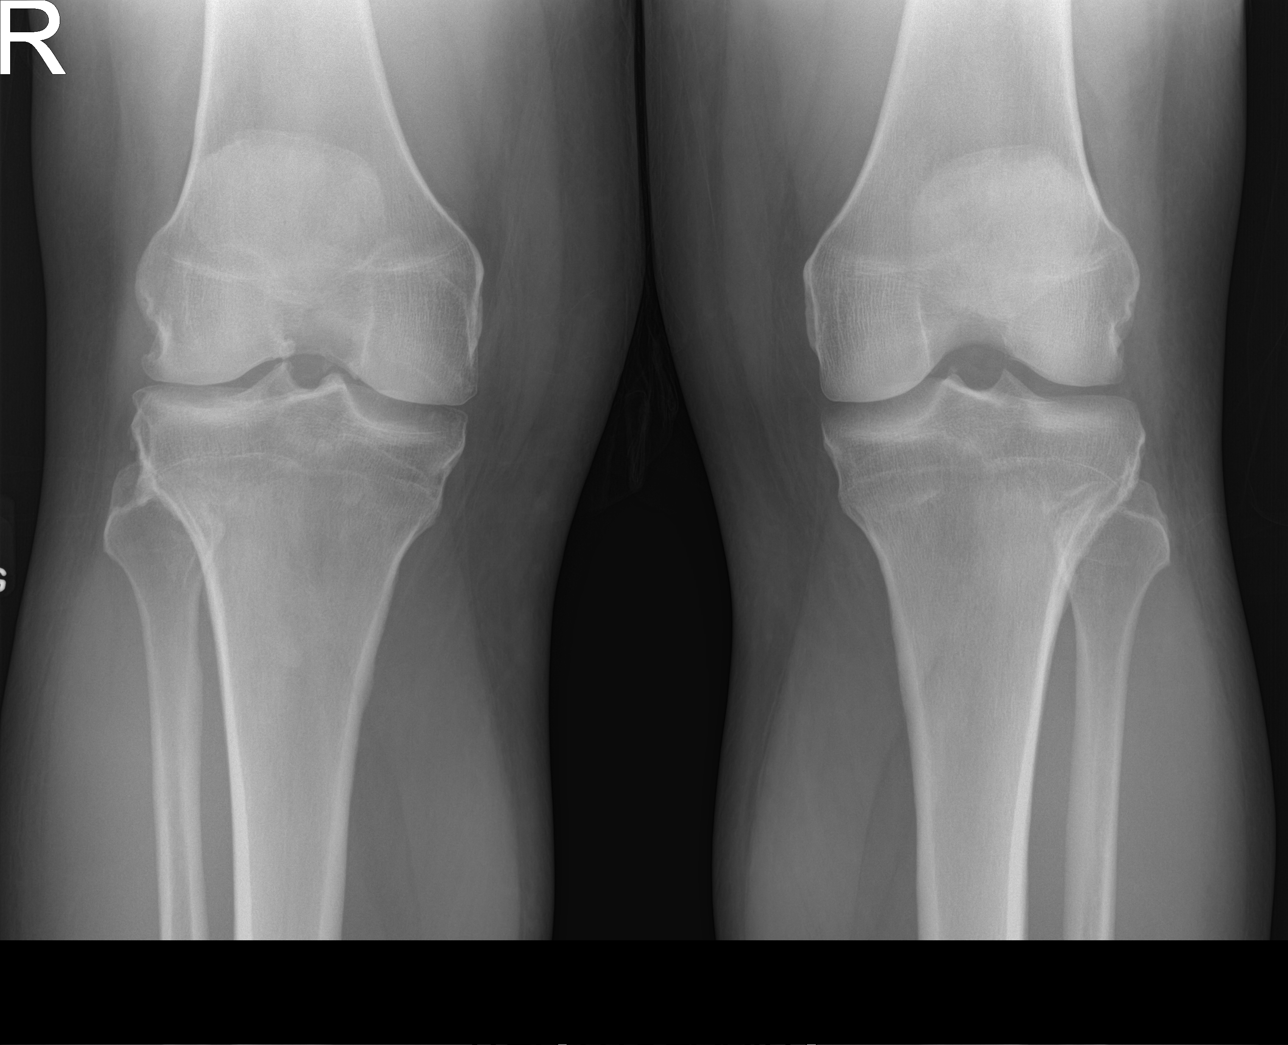

[knee tunnel]
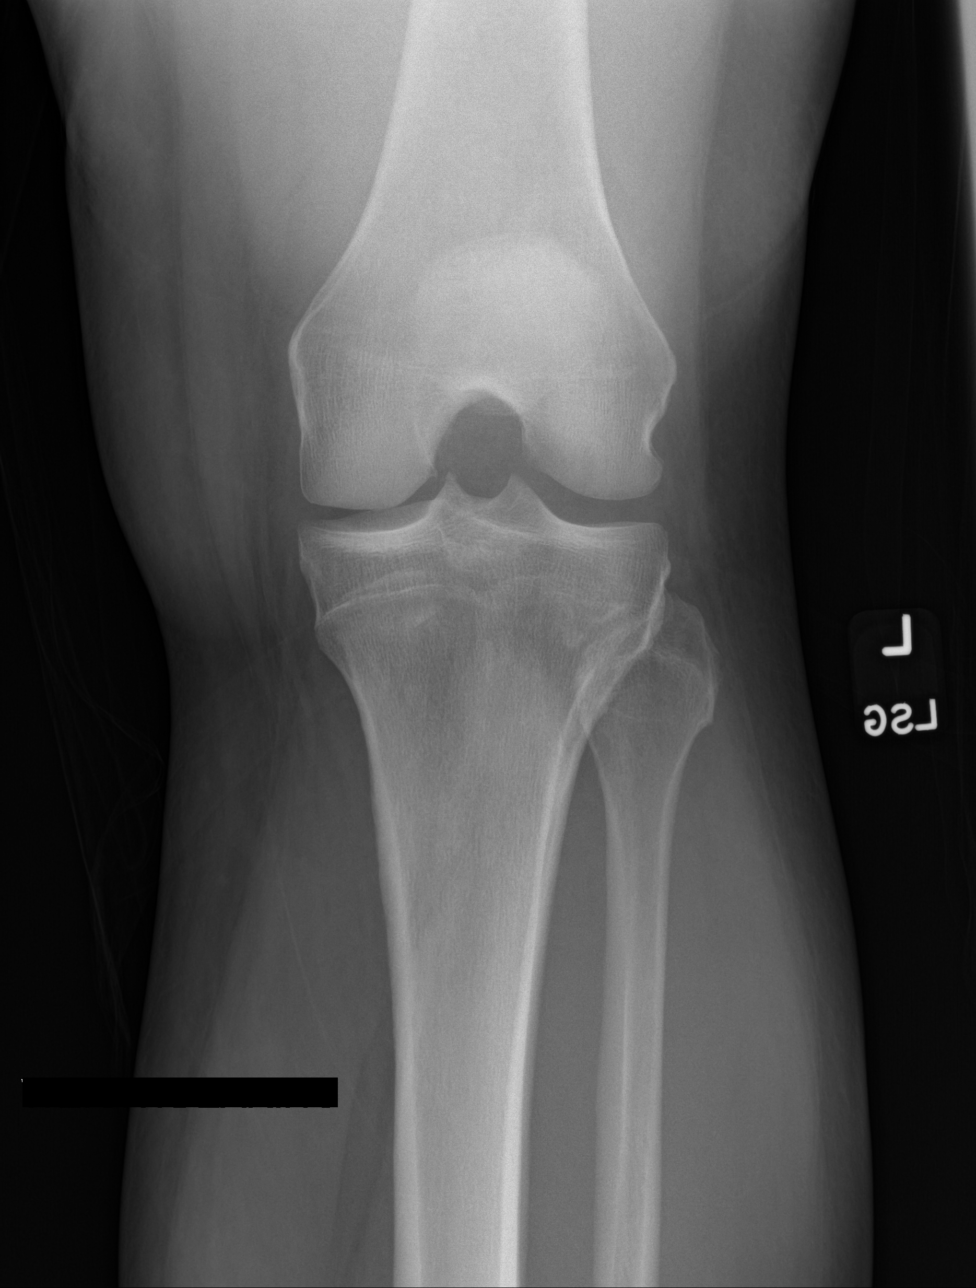

[knee lat]
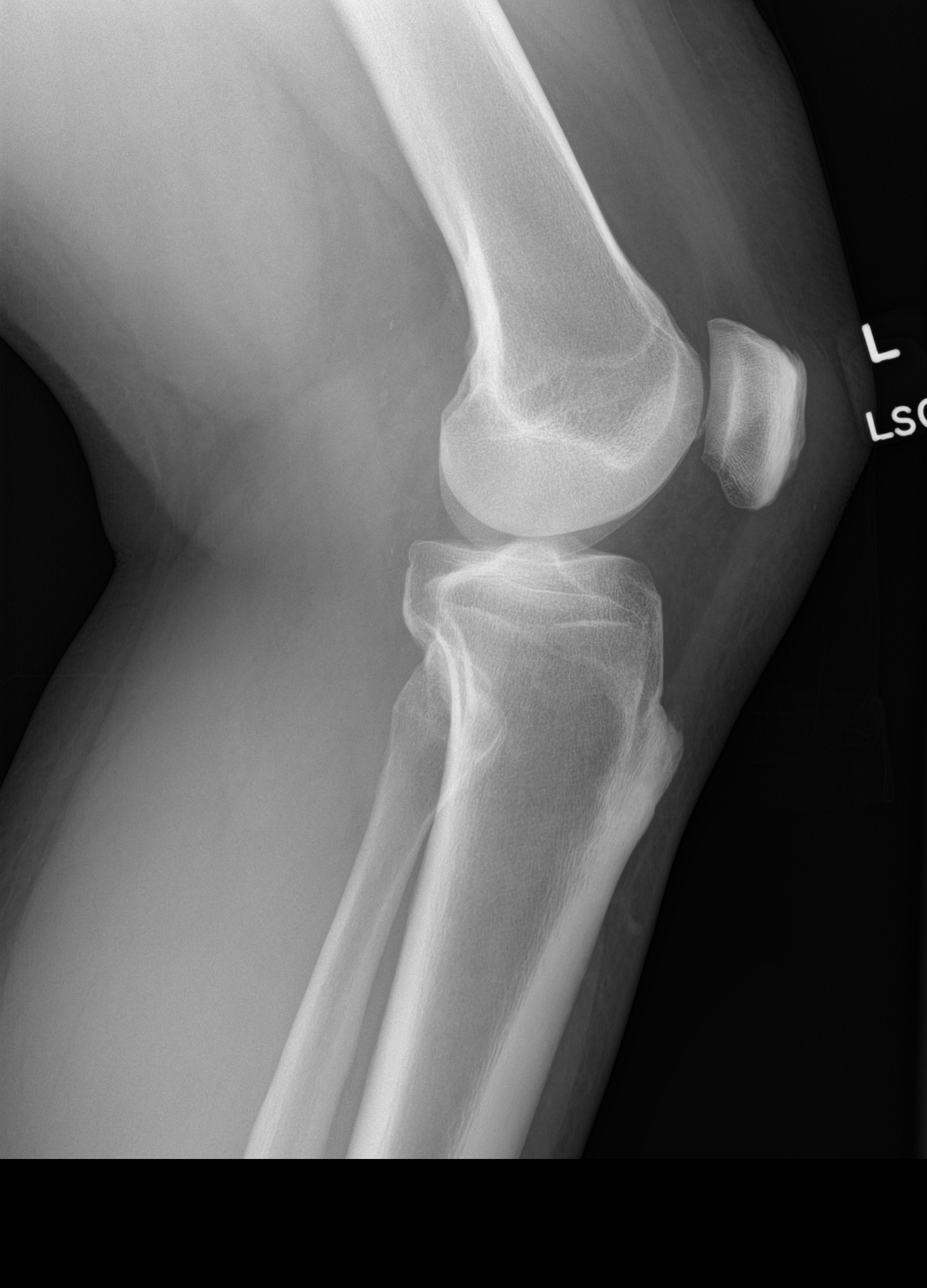

[patella skyline]
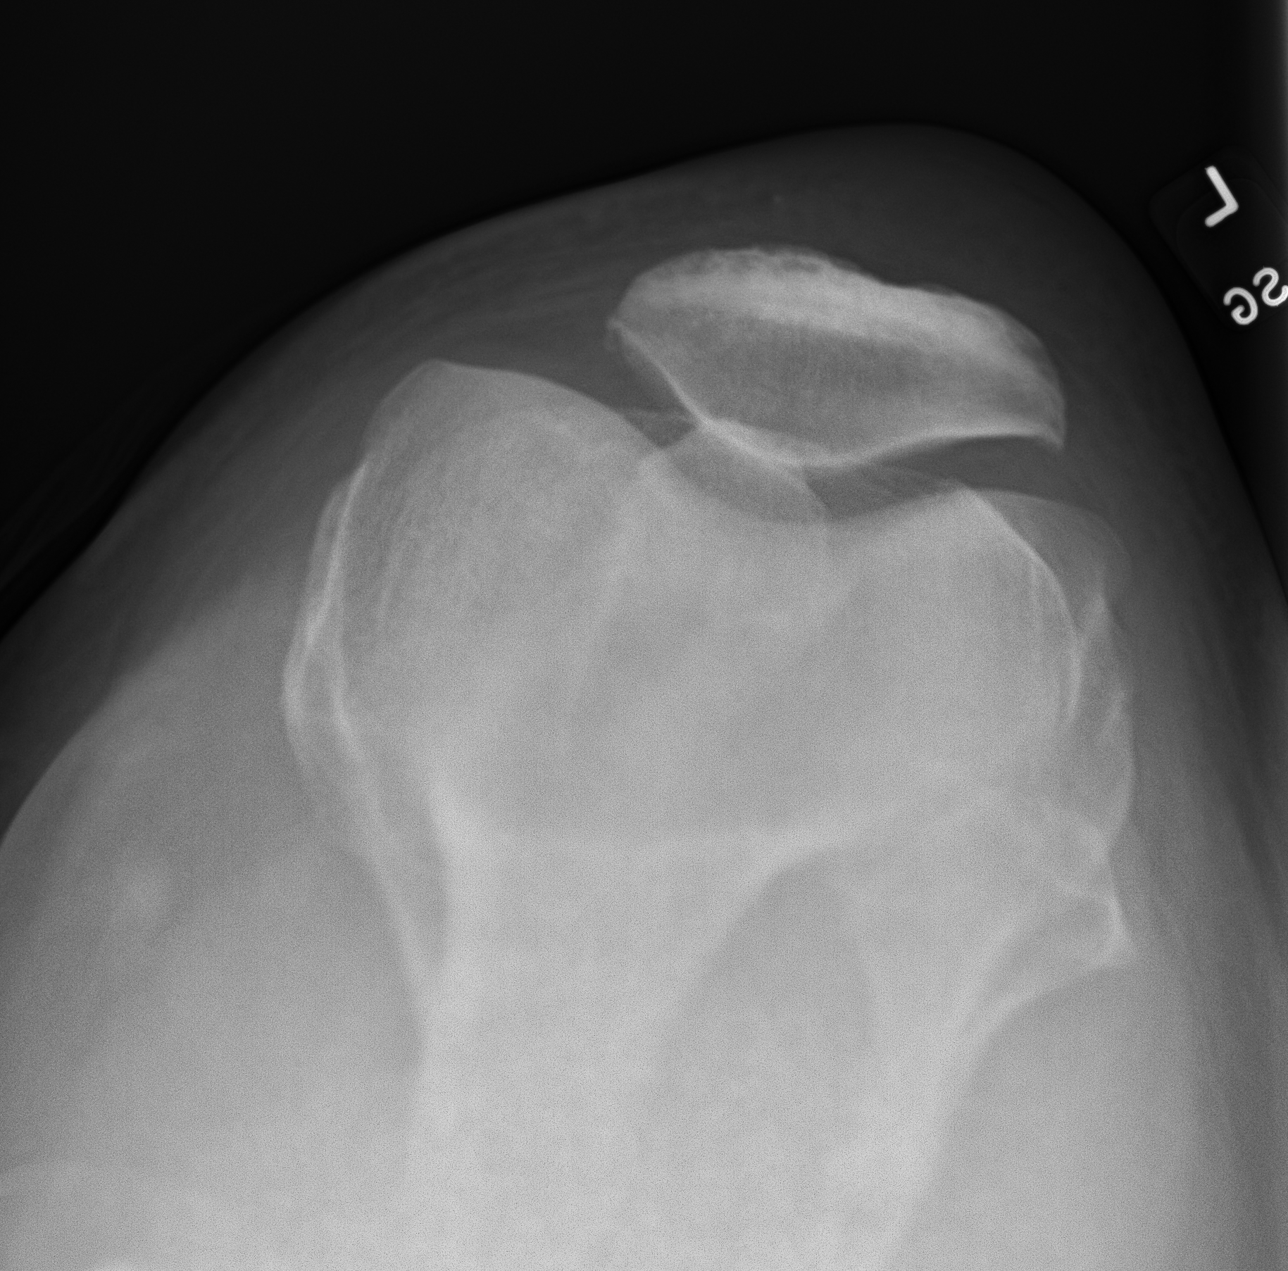

[4 of 4 positions shown; findings below may reference images not displayed]

FINDINGS: On the left, there may be a tiny exostosis along the medial aspect
of the medial femoral condyle. Left knee is otherwise unremarkable.
Comparison view of the right knee shows chondrocalcinosis and
lateral compartment osteophytosis.
IMPRESSION: 1. Possible tiny exostosis along the medial aspect of the medial
femoral condyle. Otherwise, left knee is unremarkable.
2. Mild osteoarthritis in the lateral compartment of the right knee.

## 2019-08-09 ENCOUNTER — Telehealth: Payer: Self-pay | Admitting: Family Medicine

## 2019-08-09 DIAGNOSIS — E1169 Type 2 diabetes mellitus with other specified complication: Secondary | ICD-10-CM

## 2019-08-09 DIAGNOSIS — Z0000 Encounter for general adult medical examination without abnormal findings: Secondary | ICD-10-CM

## 2019-08-09 DIAGNOSIS — E119 Type 2 diabetes mellitus without complications: Secondary | ICD-10-CM

## 2019-08-09 DIAGNOSIS — I10 Essential (primary) hypertension: Secondary | ICD-10-CM

## 2019-08-09 DIAGNOSIS — Z125 Encounter for screening for malignant neoplasm of prostate: Secondary | ICD-10-CM

## 2019-08-09 NOTE — Telephone Encounter (Signed)
-----   Message from Ellamae Sia sent at 08/05/2019  8:38 AM EST ----- Regarding: Lab orders for Tuesday, 12.8.20 Patient is scheduled for CPX labs, please order future labs, Thanks , Karna Christmas

## 2019-08-11 ENCOUNTER — Other Ambulatory Visit (INDEPENDENT_AMBULATORY_CARE_PROVIDER_SITE_OTHER): Payer: BC Managed Care – PPO

## 2019-08-11 ENCOUNTER — Other Ambulatory Visit: Payer: Self-pay

## 2019-08-11 DIAGNOSIS — I10 Essential (primary) hypertension: Secondary | ICD-10-CM | POA: Diagnosis not present

## 2019-08-11 DIAGNOSIS — E119 Type 2 diabetes mellitus without complications: Secondary | ICD-10-CM

## 2019-08-11 DIAGNOSIS — Z125 Encounter for screening for malignant neoplasm of prostate: Secondary | ICD-10-CM

## 2019-08-11 DIAGNOSIS — E785 Hyperlipidemia, unspecified: Secondary | ICD-10-CM

## 2019-08-11 DIAGNOSIS — E1169 Type 2 diabetes mellitus with other specified complication: Secondary | ICD-10-CM | POA: Diagnosis not present

## 2019-08-11 LAB — CBC WITH DIFFERENTIAL/PLATELET
Basophils Absolute: 0 10*3/uL (ref 0.0–0.1)
Basophils Relative: 0.8 % (ref 0.0–3.0)
Eosinophils Absolute: 0.2 10*3/uL (ref 0.0–0.7)
Eosinophils Relative: 4.1 % (ref 0.0–5.0)
HCT: 40.8 % (ref 39.0–52.0)
Hemoglobin: 13.4 g/dL (ref 13.0–17.0)
Lymphocytes Relative: 52.5 % — ABNORMAL HIGH (ref 12.0–46.0)
Lymphs Abs: 2.8 10*3/uL (ref 0.7–4.0)
MCHC: 32.8 g/dL (ref 30.0–36.0)
MCV: 89.7 fl (ref 78.0–100.0)
Monocytes Absolute: 0.8 10*3/uL (ref 0.1–1.0)
Monocytes Relative: 14.5 % — ABNORMAL HIGH (ref 3.0–12.0)
Neutro Abs: 1.5 10*3/uL (ref 1.4–7.7)
Neutrophils Relative %: 28.1 % — ABNORMAL LOW (ref 43.0–77.0)
Platelets: 272 10*3/uL (ref 150.0–400.0)
RBC: 4.55 Mil/uL (ref 4.22–5.81)
RDW: 14.5 % (ref 11.5–15.5)
WBC: 5.4 10*3/uL (ref 4.0–10.5)

## 2019-08-11 LAB — COMPREHENSIVE METABOLIC PANEL
ALT: 19 U/L (ref 0–53)
AST: 15 U/L (ref 0–37)
Albumin: 4.2 g/dL (ref 3.5–5.2)
Alkaline Phosphatase: 50 U/L (ref 39–117)
BUN: 16 mg/dL (ref 6–23)
CO2: 29 mEq/L (ref 19–32)
Calcium: 9.6 mg/dL (ref 8.4–10.5)
Chloride: 105 mEq/L (ref 96–112)
Creatinine, Ser: 1.12 mg/dL (ref 0.40–1.50)
GFR: 83.18 mL/min (ref >60.00)
Glucose, Bld: 112 mg/dL — ABNORMAL HIGH (ref 70–99)
Potassium: 4.2 mEq/L (ref 3.5–5.1)
Sodium: 140 mEq/L (ref 135–145)
Total Bilirubin: 0.3 mg/dL (ref 0.2–1.2)
Total Protein: 7.2 g/dL (ref 6.0–8.3)

## 2019-08-11 LAB — LIPID PANEL
Cholesterol: 189 mg/dL (ref 0–200)
HDL: 55.9 mg/dL (ref >39.00)
LDL Cholesterol: 104 mg/dL — ABNORMAL HIGH (ref 0–99)
NonHDL: 133.39
Total CHOL/HDL Ratio: 3
Triglycerides: 145 mg/dL (ref 0.0–149.0)
VLDL: 29 mg/dL (ref 0.0–40.0)

## 2019-08-11 LAB — HEMOGLOBIN A1C: Hgb A1c MFr Bld: 6 % (ref 4.6–6.5)

## 2019-08-11 LAB — PSA: PSA: 0.55 ng/mL (ref 0.10–4.00)

## 2019-08-11 LAB — TSH: TSH: 3.43 u[IU]/mL (ref 0.35–4.50)

## 2019-08-18 ENCOUNTER — Ambulatory Visit (INDEPENDENT_AMBULATORY_CARE_PROVIDER_SITE_OTHER): Payer: BC Managed Care – PPO | Admitting: Family Medicine

## 2019-08-18 ENCOUNTER — Encounter: Payer: Self-pay | Admitting: Family Medicine

## 2019-08-18 ENCOUNTER — Other Ambulatory Visit: Payer: Self-pay

## 2019-08-18 VITALS — BP 132/80 | HR 88 | Temp 97.6°F | Ht 73.25 in | Wt 312.3 lb

## 2019-08-18 DIAGNOSIS — Z0000 Encounter for general adult medical examination without abnormal findings: Secondary | ICD-10-CM

## 2019-08-18 DIAGNOSIS — E1169 Type 2 diabetes mellitus with other specified complication: Secondary | ICD-10-CM

## 2019-08-18 DIAGNOSIS — Z125 Encounter for screening for malignant neoplasm of prostate: Secondary | ICD-10-CM

## 2019-08-18 DIAGNOSIS — E785 Hyperlipidemia, unspecified: Secondary | ICD-10-CM

## 2019-08-18 DIAGNOSIS — I10 Essential (primary) hypertension: Secondary | ICD-10-CM

## 2019-08-18 DIAGNOSIS — E119 Type 2 diabetes mellitus without complications: Secondary | ICD-10-CM

## 2019-08-18 MED ORDER — LOSARTAN POTASSIUM 50 MG PO TABS: 50.0000 mg | ORAL_TABLET | Freq: Every day | ORAL | 3 refills | Status: DC

## 2019-08-18 MED ORDER — AMLODIPINE BESYLATE 10 MG PO TABS: 10.0000 mg | ORAL_TABLET | Freq: Every day | ORAL | 3 refills | Status: DC

## 2019-08-18 MED ORDER — ATORVASTATIN CALCIUM 10 MG PO TABS: 10.0000 mg | ORAL_TABLET | Freq: Every day | ORAL | 11 refills | Status: DC

## 2019-08-18 MED ORDER — ESOMEPRAZOLE MAGNESIUM 20 MG PO CPDR: 20.0000 mg | DELAYED_RELEASE_CAPSULE | ORAL | 3 refills | Status: DC | PRN

## 2019-08-18 MED ORDER — FENOFIBRATE MICRONIZED 134 MG PO CAPS: 134.0000 mg | ORAL_CAPSULE | Freq: Every day | ORAL | 3 refills | Status: DC

## 2019-08-18 MED ORDER — METFORMIN HCL 500 MG PO TABS: 500.0000 mg | ORAL_TABLET | Freq: Two times a day (BID) | ORAL | 3 refills | Status: DC

## 2019-08-18 NOTE — Patient Instructions (Addendum)
If you are interested in the shingles vaccine series (Shingrix), call your insurance or pharmacy to check on coverage and location it must be given.  If affordable - you can schedule it here or at your pharmacy depending on coverage    To prevent diabetes related vascular/heart disease start atorvastatin 10 mg daily  If any side effects like muscle pain please stop it and let me know  We will re check cholesterol with next labs in 3 months   Labs look good as well as blood pressure   Get back on your steroid nasal spray for allergies   Schedule fasting labs for about 3 months - to check on cholesterol and glucose control   Follow up with me in 6 months   Keep working on healthy diet and exercise  Get back on track after each holiday

## 2019-08-18 NOTE — Progress Notes (Signed)
Subjective:    Patient ID: Charles Watkins, male    DOB: 01-Mar-1966, 53 y.o.   MRN: [id number]  This visit occurred during the SARS-CoV-2 public health emergency.  Safety protocols were in place, including screening questions prior to the visit, additional usage of staff PPE, and extensive cleaning of exam room while observing appropriate contact time as indicated for disinfecting solutions.    HPI Here for health maintenance exam and to review chronic medical problems   Feeling ok in general   Wt Readings from Last 3 Encounters:  08/18/19 (!) 312 lb 5 oz (141.7 kg)  05/18/19 (!) 310 lb 8 oz (140.8 kg)  02/16/19 (!) 337 lb 5 oz (153 kg)  wt is up 2 lb  Still working on diet and exercise  Trying to keep wt off  Walking for exercise  40.92 kg/m   Zoster status -is interested in shingrix   Eye exam 7/20 Could not get a report  Sent for last eye exam   Allergies bother him -sneezing  This season has been a harder one (he got off his steroid inhaler)   Colonoscopy 1/20 with 5 y recall   Tdap 12/17   pna vacine 12/19 Flu vaccine 9/20   Prostate health Lab Results  Component Value Date   PSA 0.55 08/11/2019   PSA 1.58 08/15/2018   PSA 0.67 02/08/2017  no family h/o prostate cancer  No voiding symptoms  Nocturia once per week at most    bp is stable today  No cp or palpitations or headaches or edema  No side effects to medicines  BP Readings from Last 3 Encounters:  08/18/19 132/80  05/18/19 (!) 144/86  02/16/19 (!) 148/82   last visit changed ace to ARB due to cough Cough is gone   DM2 Lab Results  Component Value Date   HGBA1C 6.0 08/11/2019   Well controlled  Up from 5.6  On arb  Needs to start statin  Continues metformin  He stays away from sweets and carbs      hyperlipidemia Lab Results  Component Value Date   CHOL 189 08/11/2019   CHOL 188 02/12/2019   CHOL 176 08/15/2018   Lab Results  Component Value Date   HDL 55.90 08/11/2019   HDL 40.70 02/12/2019   HDL 44.30 08/15/2018   Lab Results  Component Value Date   LDLCALC 104 (H) 08/11/2019   LDLCALC 122 (H) 02/12/2019   LDLCALC 104 (H) 08/15/2018   Lab Results  Component Value Date   TRIG 145.0 08/11/2019   TRIG 129.0 02/12/2019   TRIG 137.0 08/15/2018   Lab Results  Component Value Date   CHOLHDL 3 08/11/2019   CHOLHDL 5 02/12/2019   CHOLHDL 4 08/15/2018   Lab Results  Component Value Date   LDLDIRECT 117.0 08/10/2016   LDLDIRECT 130.7 09/28/2013   LDLDIRECT 136.5 03/12/2012   Not on statin currently  Open to that  Is eating healthy  Continues lofibra   Other labs Lab Results  Component Value Date   CREATININE 1.12 08/11/2019   BUN 16 08/11/2019   NA 140 08/11/2019   K 4.2 08/11/2019   CL 105 08/11/2019   CO2 29 08/11/2019   Lab Results  Component Value Date   TSH 3.43 08/11/2019   Lab Results  Component Value Date   ALT 19 08/11/2019   AST 15 08/11/2019   ALKPHOS 50 08/11/2019   BILITOT 0.3 08/11/2019   Lab Results  Component Value Date  WBC 5.4 08/11/2019   HGB 13.4 08/11/2019   HCT 40.8 08/11/2019   MCV 89.7 08/11/2019   PLT 272.0 08/11/2019    Patient Active Problem List   Diagnosis Date Noted  . Colon cancer screening 08/18/2018  . Left knee pain 02/10/2018  . Controlled type 2 diabetes mellitus without complication, without long-term current use of insulin ([place]) 11/04/2017  . H/O motion sickness 11/04/2017  . Screening for HIV (human immunodeficiency virus) 08/09/2015  . Prostate cancer screening 08/03/2015  . Routine general medical examination at a health care facility 03/22/2013  . Morbid obesity ([place]) 09/23/2012  . Essential hypertension 08/14/2007  . Allergic rhinitis 08/14/2007  . BAKER'S CYST 08/14/2007  . SLEEP APNEA 08/14/2007  . Hyperlipidemia associated with type 2 diabetes mellitus ([place]) 01/16/2007  . ADVEF, DRUG/MEDICINAL/BIOLOGICAL SUBST NOS 01/16/2007   Past Medical History:  Diagnosis Date  .  Allergic rhinitis   . Allergy   . Arthritis    knee  . Diabetes mellitus without complication ([place])   . GERD (gastroesophageal reflux disease)   . Hyperglycemia   . Hyperlipidemia   . Hypertension    Past Surgical History:  Procedure Laterality Date  . Baker's cyst - aspirated  03/2005  . KNEE ARTHROSCOPY    . WISDOM TOOTH EXTRACTION     Social History   Tobacco Use  . Smoking status: Former Smoker    Packs/day: 0.10    Types: Cigarettes    Quit date: 08/06/2011    Years since quitting: 8.0  . Smokeless tobacco: Current User    Types: Chew  . Tobacco comment: non smoker  Substance Use Topics  . Alcohol use: Yes    Alcohol/week: 0.0 standard drinks    Comment: drinks on weekend  . Drug use: No   Family History  Problem Relation Age of Onset  . Heart disease Mother   . Thyroid disease Mother   . Hypertension Mother   . Brain cancer Mother   . Colon cancer Neg Hx   . Colon polyps Neg Hx   . Esophageal cancer Neg Hx   . Rectal cancer Neg Hx   . Stomach cancer Neg Hx    Allergies  Allergen Reactions  . Lisinopril Cough   Current Outpatient Medications on File Prior to Visit  Medication Sig Dispense Refill  . aspirin 81 MG tablet Take 81 mg by mouth daily.      . cetirizine (ZYRTEC) 10 MG tablet Take 10 mg by mouth every other day.     . Multiple Vitamin (MULTIVITAMIN) tablet Take 1 tablet by mouth daily.       No current facility-administered medications on file prior to visit.      Review of Systems  Constitutional: Negative for activity change, appetite change, fatigue, fever and unexpected weight change.  HENT: Negative for congestion, rhinorrhea, sore throat and trouble swallowing.   Eyes: Negative for pain, redness, itching and visual disturbance.  Respiratory: Negative for cough, chest tightness, shortness of breath and wheezing.   Cardiovascular: Negative for chest pain and palpitations.  Gastrointestinal: Negative for abdominal pain, blood in stool,  constipation, diarrhea and nausea.  Endocrine: Negative for cold intolerance, heat intolerance, polydipsia and polyuria.  Genitourinary: Negative for difficulty urinating, dysuria, frequency and urgency.  Musculoskeletal: Negative for arthralgias, joint swelling and myalgias.  Skin: Negative for pallor and rash.  Neurological: Negative for dizziness, tremors, weakness, numbness and headaches.  Hematological: Negative for adenopathy. Does not bruise/bleed easily.  Psychiatric/Behavioral: Negative for decreased concentration  and dysphoric mood. The patient is not nervous/anxious.        Objective:   Physical Exam Constitutional:      General: He is not in acute distress.    Appearance: Normal appearance. He is well-developed. He is obese. He is not ill-appearing or diaphoretic.     Comments: Baseline speech impediment  HENT:     Head: Normocephalic and atraumatic.     Right Ear: Tympanic membrane, ear canal and external ear normal.     Left Ear: Tympanic membrane, ear canal and external ear normal.     Nose: Nose normal. No congestion.     Mouth/Throat:     Mouth: Mucous membranes are moist.     Pharynx: Oropharynx is clear. No posterior oropharyngeal erythema.  Eyes:     General: No scleral icterus.       Right eye: No discharge.        Left eye: No discharge.     Conjunctiva/sclera: Conjunctivae normal.     Pupils: Pupils are equal, round, and reactive to light.  Neck:     Thyroid: No thyromegaly.     Vascular: No carotid bruit or JVD.  Cardiovascular:     Rate and Rhythm: Normal rate and regular rhythm.     Pulses: Normal pulses.     Heart sounds: Normal heart sounds. No gallop.   Pulmonary:     Effort: Pulmonary effort is normal. No respiratory distress.     Breath sounds: Normal breath sounds. No wheezing or rales.     Comments: Good air exch Chest:     Chest wall: No tenderness.  Abdominal:     General: Bowel sounds are normal. There is no distension or abdominal  bruit.     Palpations: Abdomen is soft. There is no mass.     Tenderness: There is no abdominal tenderness.     Hernia: No hernia is present.  Musculoskeletal:        General: No tenderness.     Cervical back: Normal range of motion and neck supple. No rigidity. No muscular tenderness.     Right lower leg: No edema.     Left lower leg: No edema.  Lymphadenopathy:     Cervical: No cervical adenopathy.  Skin:    General: Skin is warm and dry.     Coloration: Skin is not pale.     Findings: No erythema or rash.     Comments: Skin tags on neck  Scar from old injury on anterior L ankle  Neurological:     Mental Status: He is alert.     Cranial Nerves: No cranial nerve deficit.     Sensory: No sensory deficit.     Motor: No abnormal muscle tone.     Coordination: Coordination normal.     Gait: Gait normal.     Deep Tendon Reflexes: Reflexes are normal and symmetric. Reflexes normal.  Psychiatric:        Mood and Affect: Mood normal.        Cognition and Memory: Cognition and memory normal.     Comments: Mood is good           Assessment & Plan:   Problem List Items Addressed This Visit      Cardiovascular and Mediastinum   Essential hypertension    bp in fair control at this time  BP Readings from Last 1 Encounters:  08/18/19 132/80   No changes needed Most recent labs reviewed  Disc  lifstyle change with low sodium diet and exercise        Relevant Medications   atorvastatin (LIPITOR) 10 MG tablet   losartan (COZAAR) 50 MG tablet   fenofibrate micronized (LOFIBRA) 134 MG capsule   amLODipine (NORVASC) 10 MG tablet     Endocrine   Hyperlipidemia associated with type 2 diabetes mellitus (HCC)    LDL of 104 - improved  Goal is under 70/ for diabetics Start atorvastatin 10 mg daily  inst to call if side eff like aches/pains Disc goals for lipids and reasons to control them Rev last labs with pt Rev low sat fat diet in detail Plan to re check in 3 mo       Relevant Medications   atorvastatin (LIPITOR) 10 MG tablet   metFORMIN (GLUCOPHAGE) 500 MG tablet   losartan (COZAAR) 50 MG tablet   fenofibrate micronized (LOFIBRA) 134 MG capsule   Other Relevant Orders   Lipid panel   ALT   AST   Controlled type 2 diabetes mellitus without complication, without long-term current use of insulin (HCC)    This remains well controlled after weight loss with better habits Lab Results  Component Value Date   HGBA1C 6.0 08/11/2019  continues metformin  Also starting atorvastatin  On arb  Re check 3 mo  F/u 6 mo  Nl foot exam  Eye exam was 7/20      Relevant Medications   atorvastatin (LIPITOR) 10 MG tablet   metFORMIN (GLUCOPHAGE) 500 MG tablet   losartan (COZAAR) 50 MG tablet   Other Relevant Orders   Hemoglobin A1c     Other   Morbid obesity (HCC)    Discussed how this problem influences overall health and the risks it imposes  Reviewed plan for weight loss with lower calorie diet (via better food choices and also portion control or program like weight watchers) and exercise building up to or more than 30 minutes 5 days per week including some aerobic activity   Commended wt loss so far-urged pt to keep it up      Relevant Medications   metFORMIN (GLUCOPHAGE) 500 MG tablet   Routine general medical examination at a health care facility - Primary    Reviewed health habits including diet and exercise and skin cancer prevention Reviewed appropriate screening tests for age  Also reviewed health mt list, fam hx and immunization status , as well as social and family history   See HPI Labs reviewed  Encouraged further wt loss  Given info on shingrix vaccine to consider  psa is in good range           Prostate cancer screening    No family hx Lab Results  Component Value Date   PSA 0.55 08/11/2019   PSA 1.58 08/15/2018   PSA 0.67 02/08/2017    No voiding symptoms Will continue to follow

## 2019-08-18 NOTE — Assessment & Plan Note (Signed)
Discussed how this problem influences overall health and the risks it imposes  Reviewed plan for weight loss with lower calorie diet (via better food choices and also portion control or program like weight watchers) and exercise building up to or more than 30 minutes 5 days per week including some aerobic activity   Commended wt loss so far-urged pt to keep it up

## 2019-08-18 NOTE — Assessment & Plan Note (Signed)
This remains well controlled after weight loss with better habits Lab Results  Component Value Date   HGBA1C 6.0 08/11/2019  continues metformin  Also starting atorvastatin  On arb  Re check 3 mo  F/u 6 mo  Nl foot exam  Eye exam was 7/20

## 2019-08-18 NOTE — Assessment & Plan Note (Signed)
Reviewed health habits including diet and exercise and skin cancer prevention Reviewed appropriate screening tests for age  Also reviewed health mt list, fam hx and immunization status , as well as social and family history   See HPI Labs reviewed  Encouraged further wt loss  Given info on shingrix vaccine to consider  psa is in good range

## 2019-08-18 NOTE — Assessment & Plan Note (Signed)
No family hx Lab Results  Component Value Date   PSA 0.55 08/11/2019   PSA 1.58 08/15/2018   PSA 0.67 02/08/2017    No voiding symptoms Will continue to follow

## 2019-08-18 NOTE — Assessment & Plan Note (Signed)
bp in fair control at this time  BP Readings from Last 1 Encounters:  08/18/19 132/80   No changes needed Most recent labs reviewed  Disc lifstyle change with low sodium diet and exercise

## 2019-08-18 NOTE — Assessment & Plan Note (Signed)
LDL of 104 - improved  Goal is under 70/ for diabetics Start atorvastatin 10 mg daily  inst to call if side eff like aches/pains Disc goals for lipids and reasons to control them Rev last labs with pt Rev low sat fat diet in detail Plan to re check in 3 mo

## 2019-11-11 ENCOUNTER — Other Ambulatory Visit: Payer: Self-pay | Admitting: *Deleted

## 2019-11-11 MED ORDER — ATORVASTATIN CALCIUM 10 MG PO TABS: 10.0000 mg | ORAL_TABLET | Freq: Every day | ORAL | 2 refills | Status: DC

## 2019-11-11 NOTE — Telephone Encounter (Signed)
Received fax requesting Rx be sent to pt's mail order pharmacy. done

## 2019-11-18 ENCOUNTER — Other Ambulatory Visit (INDEPENDENT_AMBULATORY_CARE_PROVIDER_SITE_OTHER): Payer: BC Managed Care – PPO

## 2019-11-18 ENCOUNTER — Other Ambulatory Visit: Payer: Self-pay

## 2019-11-18 DIAGNOSIS — E1169 Type 2 diabetes mellitus with other specified complication: Secondary | ICD-10-CM

## 2019-11-18 DIAGNOSIS — E119 Type 2 diabetes mellitus without complications: Secondary | ICD-10-CM | POA: Diagnosis not present

## 2019-11-18 DIAGNOSIS — E785 Hyperlipidemia, unspecified: Secondary | ICD-10-CM

## 2019-11-18 LAB — LIPID PANEL
Cholesterol: 154 mg/dL (ref 0–200)
HDL: 47.8 mg/dL (ref >39.00)
LDL Cholesterol: 87 mg/dL (ref 0–99)
NonHDL: 106.67
Total CHOL/HDL Ratio: 3
Triglycerides: 100 mg/dL (ref 0.0–149.0)
VLDL: 20 mg/dL (ref 0.0–40.0)

## 2019-11-18 LAB — AST: AST: 23 U/L (ref 0–37)

## 2019-11-18 LAB — HEMOGLOBIN A1C: Hgb A1c MFr Bld: 6.1 % (ref 4.6–6.5)

## 2019-11-18 LAB — ALT: ALT: 25 U/L (ref 0–53)

## 2019-11-19 ENCOUNTER — Encounter: Payer: Self-pay | Admitting: *Deleted

## 2020-02-18 ENCOUNTER — Other Ambulatory Visit: Payer: Self-pay

## 2020-02-18 DIAGNOSIS — Z20822 Contact with and (suspected) exposure to covid-19: Secondary | ICD-10-CM | POA: Diagnosis not present

## 2020-02-19 ENCOUNTER — Ambulatory Visit: Payer: BC Managed Care – PPO | Admitting: Family Medicine

## 2020-02-19 ENCOUNTER — Other Ambulatory Visit: Payer: Self-pay

## 2020-02-19 ENCOUNTER — Encounter: Payer: Self-pay | Admitting: Family Medicine

## 2020-02-19 VITALS — BP 130/82 | HR 77 | Temp 96.8°F | Ht 73.25 in | Wt 325.0 lb

## 2020-02-19 DIAGNOSIS — E119 Type 2 diabetes mellitus without complications: Secondary | ICD-10-CM | POA: Diagnosis not present

## 2020-02-19 DIAGNOSIS — E1169 Type 2 diabetes mellitus with other specified complication: Secondary | ICD-10-CM | POA: Diagnosis not present

## 2020-02-19 DIAGNOSIS — I10 Essential (primary) hypertension: Secondary | ICD-10-CM

## 2020-02-19 DIAGNOSIS — E785 Hyperlipidemia, unspecified: Secondary | ICD-10-CM

## 2020-02-19 LAB — POCT GLYCOSYLATED HEMOGLOBIN (HGB A1C): Hemoglobin A1C: 5.9 % — AB (ref 4.0–5.6)

## 2020-02-19 NOTE — Progress Notes (Signed)
Subjective:    Patient ID: Charles Watkins, male    DOB: 1966/07/26, 54 y.o.   MRN: [id number]  This visit occurred during the SARS-CoV-2 public health emergency.  Safety protocols were in place, including screening questions prior to the visit, additional usage of staff PPE, and extensive cleaning of exam room while observing appropriate contact time as indicated for disinfecting solutions.    HPI Pt presents for f/u of chronic medical problems   Wt Readings from Last 3 Encounters:  02/19/20 (!) 325 lb (147.4 kg)  08/18/19 (!) 312 lb 5 oz (141.7 kg)  05/18/19 (!) 310 lb 8 oz (140.8 kg)  eating the same things as when he lost weight  Not a lot of exercise so gained weight back  42.59 kg/m   Has a gym to go to  Had his covid immunization    Shift changed to 3-12 now  Harder to exercise and eat well  Would be interested in getting some weights    HTN bp is stable today  No cp or palpitations or headaches or edema  No side effects to medicines  BP Readings from Last 3 Encounters:  02/19/20 130/82  08/18/19 132/80  05/18/19 (!) 144/86     Pulse Readings from Last 3 Encounters:  02/19/20 77  08/18/19 88  05/18/19 84     Hyperlipidemia Lab Results  Component Value Date   CHOL 154 11/18/2019   HDL 47.80 11/18/2019   LDLCALC 87 11/18/2019   LDLDIRECT 117.0 08/10/2016   TRIG 100.0 11/18/2019   CHOLHDL 3 11/18/2019   Improved with atorvastatin 10 mg   DM2 Lab Results  Component Value Date   HGBA1C 6.1 11/18/2019   On statin and arb  Metformin   Today Lab Results  Component Value Date   HGBA1C 5.9 (A) 02/19/2020   Good  On arb and statin  Metformin works well  No low glucose symptoms   Patient Active Problem List   Diagnosis Date Noted  . Colon cancer screening 08/18/2018  . Left knee pain 02/10/2018  . Controlled type 2 diabetes mellitus without complication, without long-term current use of insulin ([place]) 11/04/2017  . H/O motion sickness  11/04/2017  . Screening for HIV (human immunodeficiency virus) 08/09/2015  . Prostate cancer screening 08/03/2015  . Routine general medical examination at a health care facility 03/22/2013  . Morbid obesity ([place]) 09/23/2012  . Essential hypertension 08/14/2007  . Allergic rhinitis 08/14/2007  . BAKER'S CYST 08/14/2007  . SLEEP APNEA 08/14/2007  . Hyperlipidemia associated with type 2 diabetes mellitus ([place]) 01/16/2007  . ADVEF, DRUG/MEDICINAL/BIOLOGICAL SUBST NOS 01/16/2007   Past Medical History:  Diagnosis Date  . Allergic rhinitis   . Allergy   . Arthritis    knee  . Diabetes mellitus without complication ([place])   . GERD (gastroesophageal reflux disease)   . Hyperglycemia   . Hyperlipidemia   . Hypertension    Past Surgical History:  Procedure Laterality Date  . Baker's cyst - aspirated  03/2005  . KNEE ARTHROSCOPY    . WISDOM TOOTH EXTRACTION     Social History   Tobacco Use  . Smoking status: Former Smoker    Packs/day: 0.10    Types: Cigarettes    Quit date: 08/06/2011    Years since quitting: 8.5  . Smokeless tobacco: Current User    Types: Chew  . Tobacco comment: non smoker  Vaping Use  . Vaping Use: Never used  Substance Use Topics  . Alcohol  use: Yes    Alcohol/week: 0.0 standard drinks    Comment: drinks on weekend  . Drug use: No   Family History  Problem Relation Age of Onset  . Heart disease Mother   . Thyroid disease Mother   . Hypertension Mother   . Brain cancer Mother   . Colon cancer Neg Hx   . Colon polyps Neg Hx   . Esophageal cancer Neg Hx   . Rectal cancer Neg Hx   . Stomach cancer Neg Hx    Allergies  Allergen Reactions  . Lisinopril Cough   Current Outpatient Medications on File Prior to Visit  Medication Sig Dispense Refill  . amLODipine (NORVASC) 10 MG tablet Take 1 tablet (10 mg total) by mouth daily. 90 tablet 3  . aspirin 81 MG tablet Take 81 mg by mouth daily.      Marland Kitchen atorvastatin (LIPITOR) 10 MG tablet Take 1 tablet  (10 mg total) by mouth daily. In evening 90 tablet 2  . cetirizine (ZYRTEC) 10 MG tablet Take 10 mg by mouth every other day.     . esomeprazole (NEXIUM) 20 MG capsule Take 1 capsule (20 mg total) by mouth as needed. 90 capsule 3  . fenofibrate micronized (LOFIBRA) 134 MG capsule Take 1 capsule (134 mg total) by mouth daily. 90 capsule 3  . losartan (COZAAR) 50 MG tablet Take 1 tablet (50 mg total) by mouth daily. 90 tablet 3  . metFORMIN (GLUCOPHAGE) 500 MG tablet Take 1 tablet (500 mg total) by mouth 2 (two) times daily with a meal. 180 tablet 3  . Multiple Vitamin (MULTIVITAMIN) tablet Take 1 tablet by mouth daily.       No current facility-administered medications on file prior to visit.     Review of Systems  Constitutional: Positive for fatigue. Negative for activity change, appetite change, fever and unexpected weight change.       Fatigue from current job schedule  HENT: Negative for congestion, rhinorrhea, sore throat and trouble swallowing.   Eyes: Negative for pain, redness, itching and visual disturbance.  Respiratory: Negative for cough, chest tightness, shortness of breath and wheezing.   Cardiovascular: Negative for chest pain and palpitations.  Gastrointestinal: Negative for abdominal pain, blood in stool, constipation, diarrhea and nausea.  Endocrine: Negative for cold intolerance, heat intolerance, polydipsia and polyuria.  Genitourinary: Negative for difficulty urinating, dysuria, frequency and urgency.  Musculoskeletal: Negative for arthralgias, joint swelling and myalgias.  Skin: Negative for pallor and rash.  Neurological: Negative for dizziness, tremors, weakness, numbness and headaches.  Hematological: Negative for adenopathy. Does not bruise/bleed easily.  Psychiatric/Behavioral: Negative for decreased concentration and dysphoric mood. The patient is not nervous/anxious.        Objective:   Physical Exam Constitutional:      General: He is not in acute  distress.    Appearance: Normal appearance. He is well-developed. He is obese. He is not ill-appearing or diaphoretic.  HENT:     Head: Normocephalic and atraumatic.  Eyes:     Conjunctiva/sclera: Conjunctivae normal.     Pupils: Pupils are equal, round, and reactive to light.  Neck:     Thyroid: No thyromegaly.     Vascular: No carotid bruit or JVD.  Cardiovascular:     Rate and Rhythm: Normal rate and regular rhythm.     Heart sounds: Normal heart sounds. No gallop.   Pulmonary:     Effort: Pulmonary effort is normal. No respiratory distress.     Breath  sounds: Normal breath sounds. No wheezing or rales.  Abdominal:     General: Bowel sounds are normal. There is no distension or abdominal bruit.     Palpations: Abdomen is soft. There is no mass.     Tenderness: There is no abdominal tenderness.  Musculoskeletal:     Cervical back: Normal range of motion and neck supple.  Lymphadenopathy:     Cervical: No cervical adenopathy.  Skin:    General: Skin is warm and dry.     Coloration: Skin is not pale.     Findings: No erythema or rash.  Neurological:     Mental Status: He is alert.     Sensory: No sensory deficit.     Coordination: Coordination normal.     Deep Tendon Reflexes: Reflexes are normal and symmetric. Reflexes normal.  Psychiatric:        Mood and Affect: Mood normal.           Assessment & Plan:   Problem List Items Addressed This Visit      Cardiovascular and Mediastinum   Essential hypertension    bp in fair control at this time  BP Readings from Last 1 Encounters:  02/19/20 130/82   No changes needed Most recent labs reviewed  Disc lifstyle change with low sodium diet and exercise          Endocrine   Hyperlipidemia associated with type 2 diabetes mellitus (HCC)    Last lipids looked stable  Tolerates 10 mg of atorvastatin  LDL 87 Disc goals for lipids and reasons to control them Rev last labs with pt Rev low sat fat diet in detail        Controlled type 2 diabetes mellitus without complication, without long-term current use of insulin ([place]) - Primary    DM remains well controlled even with recent weight gain  Lab Results  Component Value Date   HGBA1C 5.9 (A) 02/19/2020   Enc pt to continue metformin  On arb and statin  utd eye care  Disc imp of wt loss for overall control and health F/u 6 mo       Relevant Orders   POCT glycosylated hemoglobin (Hb A1C) (Completed)     Other   Morbid obesity ([place])    Discussed how this problem influences overall health and the risks it imposes  Reviewed plan for weight loss with lower calorie diet (via better food choices and also portion control or program like weight watchers) and exercise building up to or more than 30 minutes 5 days per week including some aerobic activity   Pt gained wt with his new shift- may be snacking more  Discussed food choices Disc what time of day to fit in exercise (like 9 am)

## 2020-02-19 NOTE — Patient Instructions (Addendum)
Try to add at least 30 minutes of exercise daily to help get the weight off   Watch diet as well  Take care of yourself   Numbers are stable

## 2020-02-20 NOTE — Assessment & Plan Note (Signed)
DM remains well controlled even with recent weight gain  Lab Results  Component Value Date   HGBA1C 5.9 (A) 02/19/2020   Enc pt to continue metformin  On arb and statin  utd eye care  Disc imp of wt loss for overall control and health F/u 6 mo

## 2020-02-20 NOTE — Assessment & Plan Note (Signed)
bp in fair control at this time  BP Readings from Last 1 Encounters:  02/19/20 130/82   No changes needed Most recent labs reviewed  Disc lifstyle change with low sodium diet and exercise

## 2020-02-20 NOTE — Assessment & Plan Note (Signed)
Last lipids looked stable  Tolerates 10 mg of atorvastatin  LDL 87 Disc goals for lipids and reasons to control them Rev last labs with pt Rev low sat fat diet in detail

## 2020-02-20 NOTE — Assessment & Plan Note (Signed)
Discussed how this problem influences overall health and the risks it imposes  Reviewed plan for weight loss with lower calorie diet (via better food choices and also portion control or program like weight watchers) and exercise building up to or more than 30 minutes 5 days per week including some aerobic activity   Pt gained wt with his new shift- may be snacking more  Discussed food choices Disc what time of day to fit in exercise (like 9 am)

## 2020-05-19 LAB — HM DIABETES EYE EXAM

## 2020-05-30 ENCOUNTER — Encounter: Payer: Self-pay | Admitting: Family Medicine

## 2020-07-20 ENCOUNTER — Other Ambulatory Visit: Payer: Self-pay | Admitting: Family Medicine

## 2020-08-16 ENCOUNTER — Other Ambulatory Visit: Payer: Self-pay | Admitting: Family Medicine

## 2020-08-21 ENCOUNTER — Telehealth: Payer: Self-pay | Admitting: Family Medicine

## 2020-08-21 DIAGNOSIS — E1169 Type 2 diabetes mellitus with other specified complication: Secondary | ICD-10-CM

## 2020-08-21 DIAGNOSIS — E785 Hyperlipidemia, unspecified: Secondary | ICD-10-CM

## 2020-08-21 DIAGNOSIS — Z125 Encounter for screening for malignant neoplasm of prostate: Secondary | ICD-10-CM

## 2020-08-21 DIAGNOSIS — E119 Type 2 diabetes mellitus without complications: Secondary | ICD-10-CM

## 2020-08-21 DIAGNOSIS — I10 Essential (primary) hypertension: Secondary | ICD-10-CM

## 2020-08-21 NOTE — Telephone Encounter (Signed)
-----   Message from Ellamae Sia sent at 08/08/2020  4:07 PM EST ----- Regarding: Lab orders for Monday, 12.20.21 Patient is scheduled for CPX labs, please order future labs, Thanks , Karna Christmas

## 2020-08-22 ENCOUNTER — Other Ambulatory Visit (INDEPENDENT_AMBULATORY_CARE_PROVIDER_SITE_OTHER): Payer: BC Managed Care – PPO

## 2020-08-22 ENCOUNTER — Other Ambulatory Visit: Payer: Self-pay

## 2020-08-22 DIAGNOSIS — Z125 Encounter for screening for malignant neoplasm of prostate: Secondary | ICD-10-CM | POA: Diagnosis not present

## 2020-08-22 DIAGNOSIS — E785 Hyperlipidemia, unspecified: Secondary | ICD-10-CM | POA: Diagnosis not present

## 2020-08-22 DIAGNOSIS — E119 Type 2 diabetes mellitus without complications: Secondary | ICD-10-CM | POA: Diagnosis not present

## 2020-08-22 DIAGNOSIS — I10 Essential (primary) hypertension: Secondary | ICD-10-CM | POA: Diagnosis not present

## 2020-08-22 DIAGNOSIS — E1169 Type 2 diabetes mellitus with other specified complication: Secondary | ICD-10-CM

## 2020-08-22 LAB — HEMOGLOBIN A1C: Hgb A1c MFr Bld: 6.4 % (ref 4.6–6.5)

## 2020-08-22 LAB — LIPID PANEL
Cholesterol: 141 mg/dL (ref 0–200)
HDL: 46.2 mg/dL (ref >39.00)
LDL Cholesterol: 67 mg/dL (ref 0–99)
NonHDL: 94.75
Total CHOL/HDL Ratio: 3
Triglycerides: 139 mg/dL (ref 0.0–149.0)
VLDL: 27.8 mg/dL (ref 0.0–40.0)

## 2020-08-22 LAB — COMPREHENSIVE METABOLIC PANEL
ALT: 23 U/L (ref 0–53)
AST: 13 U/L (ref 0–37)
Albumin: 4.2 g/dL (ref 3.5–5.2)
Alkaline Phosphatase: 56 U/L (ref 39–117)
BUN: 15 mg/dL (ref 6–23)
CO2: 29 mEq/L (ref 19–32)
Calcium: 9.9 mg/dL (ref 8.4–10.5)
Chloride: 105 mEq/L (ref 96–112)
Creatinine, Ser: 1.13 mg/dL (ref 0.40–1.50)
GFR: 74.26 mL/min (ref >60.00)
Glucose, Bld: 122 mg/dL — ABNORMAL HIGH (ref 70–99)
Potassium: 4.3 mEq/L (ref 3.5–5.1)
Sodium: 140 mEq/L (ref 135–145)
Total Bilirubin: 0.3 mg/dL (ref 0.2–1.2)
Total Protein: 7.6 g/dL (ref 6.0–8.3)

## 2020-08-22 LAB — CBC WITH DIFFERENTIAL/PLATELET
Basophils Absolute: 0 10*3/uL (ref 0.0–0.1)
Basophils Relative: 0.7 % (ref 0.0–3.0)
Eosinophils Absolute: 0.2 10*3/uL (ref 0.0–0.7)
Eosinophils Relative: 3.5 % (ref 0.0–5.0)
HCT: 41.5 % (ref 39.0–52.0)
Hemoglobin: 13.9 g/dL (ref 13.0–17.0)
Lymphocytes Relative: 57.8 % — ABNORMAL HIGH (ref 12.0–46.0)
Lymphs Abs: 3.1 10*3/uL (ref 0.7–4.0)
MCHC: 33.6 g/dL (ref 30.0–36.0)
MCV: 88.8 fl (ref 78.0–100.0)
Monocytes Absolute: 0.5 10*3/uL (ref 0.1–1.0)
Monocytes Relative: 9.5 % (ref 3.0–12.0)
Neutro Abs: 1.5 10*3/uL (ref 1.4–7.7)
Neutrophils Relative %: 28.5 % — ABNORMAL LOW (ref 43.0–77.0)
Platelets: 317 10*3/uL (ref 150.0–400.0)
RBC: 4.67 Mil/uL (ref 4.22–5.81)
RDW: 13.7 % (ref 11.5–15.5)
WBC: 5.3 10*3/uL (ref 4.0–10.5)

## 2020-08-22 LAB — TSH: TSH: 2.85 u[IU]/mL (ref 0.35–4.50)

## 2020-08-22 LAB — PSA: PSA: 0.47 ng/mL (ref 0.10–4.00)

## 2020-08-29 ENCOUNTER — Ambulatory Visit (INDEPENDENT_AMBULATORY_CARE_PROVIDER_SITE_OTHER): Payer: BC Managed Care – PPO | Admitting: Family Medicine

## 2020-08-29 ENCOUNTER — Other Ambulatory Visit: Payer: Self-pay

## 2020-08-29 ENCOUNTER — Encounter: Payer: Self-pay | Admitting: Family Medicine

## 2020-08-29 VITALS — BP 128/70 | HR 85 | Temp 96.9°F | Ht 73.5 in | Wt 336.5 lb

## 2020-08-29 DIAGNOSIS — Z23 Encounter for immunization: Secondary | ICD-10-CM | POA: Diagnosis not present

## 2020-08-29 DIAGNOSIS — E1169 Type 2 diabetes mellitus with other specified complication: Secondary | ICD-10-CM | POA: Diagnosis not present

## 2020-08-29 DIAGNOSIS — Z125 Encounter for screening for malignant neoplasm of prostate: Secondary | ICD-10-CM

## 2020-08-29 DIAGNOSIS — E785 Hyperlipidemia, unspecified: Secondary | ICD-10-CM

## 2020-08-29 DIAGNOSIS — Z0000 Encounter for general adult medical examination without abnormal findings: Secondary | ICD-10-CM | POA: Diagnosis not present

## 2020-08-29 DIAGNOSIS — E119 Type 2 diabetes mellitus without complications: Secondary | ICD-10-CM

## 2020-08-29 DIAGNOSIS — I10 Essential (primary) hypertension: Secondary | ICD-10-CM | POA: Diagnosis not present

## 2020-08-29 LAB — MICROALBUMIN / CREATININE URINE RATIO
Creatinine,U: 174.2 mg/dL
Microalb Creat Ratio: 1.4 mg/g (ref 0.0–30.0)
Microalb, Ur: 2.5 mg/dL — ABNORMAL HIGH (ref 0.0–1.9)

## 2020-08-29 NOTE — Assessment & Plan Note (Signed)
Reviewed health habits including diet and exercise and skin cancer prevention Reviewed appropriate screening tests for age  Also reviewed health mt list, fam hx and immunization status , as well as social and family history   See HPI Labs reviewed  Ref to healthy wt/wellness clinic for obesity  Disc plan to exercise Flu shot given  psa stable  utd covid vaccines Disc shingrix-interested if covered

## 2020-08-29 NOTE — Assessment & Plan Note (Signed)
bp in fair control at this time  BP Readings from Last 1 Encounters:  08/29/20 128/70   No changes needed Most recent labs reviewed  Disc lifstyle change with low sodium diet and exercise  Plan to continue amlodipine 10 mg daily and losartan 50 mg daily

## 2020-08-29 NOTE — Assessment & Plan Note (Signed)
Lab Results  Component Value Date   PSA 0.47 08/22/2020   PSA 0.55 08/11/2019   PSA 1.58 08/15/2018    No symptoms  No fam hx of prostate cancer

## 2020-08-29 NOTE — Progress Notes (Signed)
Subjective:    Patient ID: Charles Watkins, male    DOB: October 10, 1965, 54 y.o.   MRN: [id number]  This visit occurred during the SARS-CoV-2 public health emergency.  Safety protocols were in place, including screening questions prior to the visit, additional usage of staff PPE, and extensive cleaning of exam room while observing appropriate contact time as indicated for disinfecting solutions.    HPI Here for health maintenance exam and to review chronic medical problems    Wt Readings from Last 3 Encounters:  08/29/20 (!) 336 lb 8 oz (152.6 kg)  02/19/20 (!) 325 lb (147.4 kg)  08/18/19 (!) 312 lb 5 oz (141.7 kg)   43.79 kg/m   Doing well overall  Taking care of himself   Had gained weight  Tries to eat right (worse after holidays)  Just eats too much   Just a little exercise -not much/ likes to walk  Keeps changing shifts at work  (right now evening) Wants to be on day shift-not an option   He goes home from work and eats and stays up and eats  Dozes off - goes to bed at different times   Flu shot- wants today   Eye exam 9/21 no retinopathy  Colonoscopy 1/20   Prostate health  Lab Results  Component Value Date   PSA 0.47 08/22/2020   PSA 0.55 08/11/2019   PSA 1.58 08/15/2018  no problems with frequency or nocturia  No prostate cancer     Tdap 12/17 pna 23 12/19 covid vaccines- utd plus booster  Zoster status -would consider vaccine  HTN bp is stable today  No cp or palpitations or headaches or edema  No side effects to medicines   Taking amlodipine 10 mg daily  Losartan 50 mg daily   BP Readings from Last 3 Encounters:  08/29/20 128/70  02/19/20 130/82  08/18/19 132/80     Pulse Readings from Last 3 Encounters:  08/29/20 85  02/19/20 77  08/18/19 88   DM2 Lab Results  Component Value Date   HGBA1C 6.4 08/22/2020  up from 5.9  Taking metformin 500 mg bid  On statin  Ace allergic  Lab Results  Component Value Date   MICROALBUR 2.4  (H) 11/04/2017   No issues with hypoglycemia      Hyperlipidemia Lab Results  Component Value Date   CHOL 141 08/22/2020   CHOL 154 11/18/2019   CHOL 189 08/11/2019   Lab Results  Component Value Date   HDL 46.20 08/22/2020   HDL 47.80 11/18/2019   HDL 55.90 08/11/2019   Lab Results  Component Value Date   LDLCALC 67 08/22/2020   LDLCALC 87 11/18/2019   LDLCALC 104 (H) 08/11/2019   Lab Results  Component Value Date   TRIG 139.0 08/22/2020   TRIG 100.0 11/18/2019   TRIG 145.0 08/11/2019   Lab Results  Component Value Date   CHOLHDL 3 08/22/2020   CHOLHDL 3 11/18/2019   CHOLHDL 3 08/11/2019   Lab Results  Component Value Date   LDLDIRECT 117.0 08/10/2016   LDLDIRECT 130.7 09/28/2013   LDLDIRECT 136.5 03/12/2012   Taking lofibra 134 mg daily  Atorvastatin 10 mg daily  Does not eat fried food anymore   Patient Active Problem List   Diagnosis Date Noted  . Colon cancer screening 08/18/2018  . Left knee pain 02/10/2018  . Controlled type 2 diabetes mellitus without complication, without long-term current use of insulin (HCC) 11/04/2017  . H/O motion sickness 11/04/2017  .  Screening for HIV (human immunodeficiency virus) 08/09/2015  . Prostate cancer screening 08/03/2015  . Routine general medical examination at a health care facility 03/22/2013  . Morbid obesity ([place]) 09/23/2012  . Essential hypertension 08/14/2007  . Allergic rhinitis 08/14/2007  . BAKER'S CYST 08/14/2007  . SLEEP APNEA 08/14/2007  . Hyperlipidemia associated with type 2 diabetes mellitus ([place]) 01/16/2007  . ADVEF, DRUG/MEDICINAL/BIOLOGICAL SUBST NOS 01/16/2007   Past Medical History:  Diagnosis Date  . Allergic rhinitis   . Allergy   . Arthritis    knee  . Diabetes mellitus without complication ([place])   . GERD (gastroesophageal reflux disease)   . Hyperglycemia   . Hyperlipidemia   . Hypertension    Past Surgical History:  Procedure Laterality Date  . Baker's cyst - aspirated   03/2005  . KNEE ARTHROSCOPY    . WISDOM TOOTH EXTRACTION     Social History   Tobacco Use  . Smoking status: Former Smoker    Packs/day: 0.10    Types: Cigarettes    Quit date: 08/06/2011    Years since quitting: 9.0  . Smokeless tobacco: Current User    Types: Chew  . Tobacco comment: non smoker  Vaping Use  . Vaping Use: Never used  Substance Use Topics  . Alcohol use: Yes    Alcohol/week: 0.0 standard drinks    Comment: drinks on weekend  . Drug use: No   Family History  Problem Relation Age of Onset  . Heart disease Mother   . Thyroid disease Mother   . Hypertension Mother   . Brain cancer Mother   . Colon cancer Neg Hx   . Colon polyps Neg Hx   . Esophageal cancer Neg Hx   . Rectal cancer Neg Hx   . Stomach cancer Neg Hx    Allergies  Allergen Reactions  . Lisinopril Cough   Current Outpatient Medications on File Prior to Visit  Medication Sig Dispense Refill  . amLODipine (NORVASC) 10 MG tablet Take 1 tablet (10 mg total) by mouth daily. 90 tablet 3  . aspirin 81 MG tablet Take 81 mg by mouth daily.    Marland Kitchen atorvastatin (LIPITOR) 10 MG tablet TAKE 1 TABLET DAILY IN THE EVENING 90 tablet 3  . cetirizine (ZYRTEC) 10 MG tablet Take 10 mg by mouth every other day.    . esomeprazole (NEXIUM) 20 MG capsule Take 1 capsule (20 mg total) by mouth as needed. 90 capsule 3  . fenofibrate micronized (LOFIBRA) 134 MG capsule Take 1 capsule (134 mg total) by mouth daily. 90 capsule 3  . losartan (COZAAR) 50 MG tablet TAKE 1 TABLET DAILY 90 tablet 3  . metFORMIN (GLUCOPHAGE) 500 MG tablet TAKE 1 TABLET TWICE A DAY WITH MEALS 180 tablet 1  . Multiple Vitamin (MULTIVITAMIN) tablet Take 1 tablet by mouth daily.     No current facility-administered medications on file prior to visit.    Review of Systems  Constitutional: Negative for activity change, appetite change, fatigue, fever and unexpected weight change.  HENT: Negative for congestion, rhinorrhea, sore throat and trouble  swallowing.   Eyes: Negative for pain, redness, itching and visual disturbance.  Respiratory: Negative for cough, chest tightness, shortness of breath and wheezing.   Cardiovascular: Negative for chest pain and palpitations.  Gastrointestinal: Negative for abdominal pain, blood in stool, constipation, diarrhea and nausea.  Endocrine: Negative for cold intolerance, heat intolerance, polydipsia and polyuria.  Genitourinary: Negative for difficulty urinating, dysuria, frequency and urgency.  Musculoskeletal: Negative  for arthralgias, joint swelling and myalgias.  Skin: Negative for pallor and rash.  Neurological: Negative for dizziness, tremors, weakness, numbness and headaches.       More clumsy- has tripped a few times   Hematological: Negative for adenopathy. Does not bruise/bleed easily.  Psychiatric/Behavioral: Negative for decreased concentration and dysphoric mood. The patient is not nervous/anxious.        Objective:   Physical Exam Constitutional:      General: He is not in acute distress.    Appearance: Normal appearance. He is well-developed. He is obese. He is not ill-appearing or diaphoretic.  HENT:     Head: Normocephalic and atraumatic.     Right Ear: Tympanic membrane, ear canal and external ear normal.     Left Ear: Tympanic membrane, ear canal and external ear normal.     Nose: Nose normal. No congestion.     Mouth/Throat:     Mouth: Mucous membranes are moist.     Pharynx: Oropharynx is clear. No posterior oropharyngeal erythema.  Eyes:     General: No scleral icterus.       Right eye: No discharge.        Left eye: No discharge.     Conjunctiva/sclera: Conjunctivae normal.     Pupils: Pupils are equal, round, and reactive to light.  Neck:     Thyroid: No thyromegaly.     Vascular: No carotid bruit or JVD.  Cardiovascular:     Rate and Rhythm: Normal rate and regular rhythm.     Pulses: Normal pulses.     Heart sounds: Normal heart sounds. No gallop.    Pulmonary:     Effort: Pulmonary effort is normal. No respiratory distress.     Breath sounds: Normal breath sounds. No wheezing or rales.     Comments: Good air exch Chest:     Chest wall: No tenderness.  Abdominal:     General: Bowel sounds are normal. There is no distension or abdominal bruit.     Palpations: Abdomen is soft. There is no mass.     Tenderness: There is no abdominal tenderness.     Hernia: No hernia is present.     Comments: Obese abdomen  Musculoskeletal:        General: No tenderness.     Cervical back: Normal range of motion and neck supple. No rigidity. No muscular tenderness.     Right lower leg: No edema.     Left lower leg: No edema.  Lymphadenopathy:     Cervical: No cervical adenopathy.  Skin:    General: Skin is warm and dry.     Coloration: Skin is not pale.     Findings: No erythema or rash.     Comments: Few skin tags on neck   Neurological:     Mental Status: He is alert.     Cranial Nerves: No cranial nerve deficit.     Motor: No abnormal muscle tone.     Coordination: Coordination normal.     Gait: Gait normal.     Deep Tendon Reflexes: Reflexes are normal and symmetric. Reflexes normal.     Comments: Baseline speech impediment  Psychiatric:        Mood and Affect: Mood normal.        Cognition and Memory: Cognition and memory normal.           Assessment & Plan:   Problem List Items Addressed This Visit      Cardiovascular and  Mediastinum   Essential hypertension    bp in fair control at this time  BP Readings from Last 1 Encounters:  08/29/20 128/70   No changes needed Most recent labs reviewed  Disc lifstyle change with low sodium diet and exercise  Plan to continue amlodipine 10 mg daily and losartan 50 mg daily        Endocrine   Hyperlipidemia associated with type 2 diabetes mellitus ([place])    Disc goals for lipids and reasons to control them Rev last labs with pt Rev low sat fat diet in detail Well controlled  with lofibra 134 mg and atorvastatin 10 mg daily      Controlled type 2 diabetes mellitus without complication, without long-term current use of insulin (HCC)    Lab Results  Component Value Date   HGBA1C 6.4 08/22/2020   Plan to continue low dose metformin 500 mg bid  Diet rev  Needs exercise  On arb and statin  Nl foot exam  utd eye exam  Disc need for wt loss       Relevant Orders   Microalbumin / creatinine urine ratio     Other   Morbid obesity ([place])    Discussed how this problem influences overall health and the risks it imposes  Reviewed plan for weight loss with lower calorie diet (via better food choices and also portion control or program like weight watchers) and exercise building up to or more than 30 minutes 5 days per week including some aerobic activity   Pt eats large portions  Issues with shift work/eating at the wrong time and need for exercise  Will start to work on activity level  Ref made to healthy wt and wellness clinic      Relevant Orders   Amb Ref to Medical Weight Management   Routine general medical examination at a health care facility - Primary    Reviewed health habits including diet and exercise and skin cancer prevention Reviewed appropriate screening tests for age  Also reviewed health mt list, fam hx and immunization status , as well as social and family history   See HPI Labs reviewed  Ref to healthy wt/wellness clinic for obesity  Disc plan to exercise Flu shot given  psa stable  utd covid vaccines Disc shingrix-interested if covered       Prostate cancer screening    Lab Results  Component Value Date   PSA 0.47 08/22/2020   PSA 0.55 08/11/2019   PSA 1.58 08/15/2018    No symptoms  No fam hx of prostate cancer

## 2020-08-29 NOTE — Assessment & Plan Note (Signed)
Discussed how this problem influences overall health and the risks it imposes  Reviewed plan for weight loss with lower calorie diet (via better food choices and also portion control or program like weight watchers) and exercise building up to or more than 30 minutes 5 days per week including some aerobic activity   Pt eats large portions  Issues with shift work/eating at the wrong time and need for exercise  Will start to work on activity level  Ref made to healthy wt and wellness clinic

## 2020-08-29 NOTE — Assessment & Plan Note (Signed)
Lab Results  Component Value Date   HGBA1C 6.4 08/22/2020   Plan to continue low dose metformin 500 mg bid  Diet rev  Needs exercise  On arb and statin  Nl foot exam  utd eye exam  Disc need for wt loss

## 2020-08-29 NOTE — Patient Instructions (Addendum)
Please start to think about what time you can add exercise to your schedule (30 minutes or more)  Also cutting portions   If you are interested in the shingles vaccine series (Shingrix), call your insurance or pharmacy to check on coverage and location it must be given.  If affordable - you can schedule it here or at your pharmacy depending on coverage   Flu shot today   I want to refer you to our healthy weight and wellness clinic  The office will call you to get an appointment   Follow up in 6 months

## 2020-08-29 NOTE — Assessment & Plan Note (Signed)
Disc goals for lipids and reasons to control them Rev last labs with pt Rev low sat fat diet in detail Well controlled with lofibra 134 mg and atorvastatin 10 mg daily

## 2020-09-05 ENCOUNTER — Other Ambulatory Visit: Payer: Self-pay | Admitting: Family Medicine

## 2020-09-07 ENCOUNTER — Telehealth: Payer: Self-pay | Admitting: *Deleted

## 2020-09-07 NOTE — Telephone Encounter (Signed)
Patient's wife left a voicemail stating that she wanted to update her husband's mail order pharmacy to [facility] telephone number [phone]. Toniann Fail stated that future refills will need to go to [facility]. If you have any questions call her back.

## 2020-09-08 NOTE — Telephone Encounter (Signed)
Pharmacy has been updated for [facility] Mail order.

## 2020-11-30 ENCOUNTER — Ambulatory Visit (INDEPENDENT_AMBULATORY_CARE_PROVIDER_SITE_OTHER): Payer: Self-pay | Admitting: Family Medicine

## 2020-12-01 ENCOUNTER — Ambulatory Visit (INDEPENDENT_AMBULATORY_CARE_PROVIDER_SITE_OTHER): Payer: BC Managed Care – PPO | Admitting: Family Medicine

## 2020-12-01 ENCOUNTER — Encounter (INDEPENDENT_AMBULATORY_CARE_PROVIDER_SITE_OTHER): Payer: Self-pay | Admitting: Family Medicine

## 2020-12-01 ENCOUNTER — Other Ambulatory Visit: Payer: Self-pay

## 2020-12-01 VITALS — BP 130/69 | HR 83 | Temp 97.8°F | Ht 74.0 in | Wt 328.0 lb

## 2020-12-01 DIAGNOSIS — I10 Essential (primary) hypertension: Secondary | ICD-10-CM | POA: Diagnosis not present

## 2020-12-01 DIAGNOSIS — E1169 Type 2 diabetes mellitus with other specified complication: Secondary | ICD-10-CM

## 2020-12-01 DIAGNOSIS — Z0289 Encounter for other administrative examinations: Secondary | ICD-10-CM

## 2020-12-01 DIAGNOSIS — Z1331 Encounter for screening for depression: Secondary | ICD-10-CM

## 2020-12-01 DIAGNOSIS — E785 Hyperlipidemia, unspecified: Secondary | ICD-10-CM

## 2020-12-01 DIAGNOSIS — Z9189 Other specified personal risk factors, not elsewhere classified: Secondary | ICD-10-CM

## 2020-12-01 DIAGNOSIS — R6889 Other general symptoms and signs: Secondary | ICD-10-CM | POA: Diagnosis not present

## 2020-12-01 DIAGNOSIS — R5383 Other fatigue: Secondary | ICD-10-CM

## 2020-12-01 DIAGNOSIS — R0602 Shortness of breath: Secondary | ICD-10-CM

## 2020-12-01 DIAGNOSIS — Z6841 Body Mass Index (BMI) 40.0 and over, adult: Secondary | ICD-10-CM

## 2020-12-02 LAB — LIPID PANEL WITH LDL/HDL RATIO
Cholesterol, Total: 139 mg/dL (ref 100–199)
HDL: 40 mg/dL (ref >39)
LDL Chol Calc (NIH): 74 mg/dL (ref 0–99)
LDL/HDL Ratio: 1.9 ratio (ref 0.0–3.6)
Triglycerides: 143 mg/dL (ref 0–149)
VLDL Cholesterol Cal: 25 mg/dL (ref 5–40)

## 2020-12-02 LAB — COMPREHENSIVE METABOLIC PANEL
ALT: 23 IU/L (ref 0–44)
AST: 23 IU/L (ref 0–40)
Albumin/Globulin Ratio: 1.3 (ref 1.2–2.2)
Albumin: 4.3 g/dL (ref 3.8–4.9)
Alkaline Phosphatase: 60 IU/L (ref 44–121)
BUN/Creatinine Ratio: 12 (ref 9–20)
BUN: 13 mg/dL (ref 6–24)
Bilirubin Total: 0.3 mg/dL (ref 0.0–1.2)
CO2: 21 mmol/L (ref 20–29)
Calcium: 9.3 mg/dL (ref 8.7–10.2)
Chloride: 102 mmol/L (ref 96–106)
Creatinine, Ser: 1.11 mg/dL (ref 0.76–1.27)
Globulin, Total: 3.2 g/dL (ref 1.5–4.5)
Glucose: 121 mg/dL — ABNORMAL HIGH (ref 65–99)
Potassium: 4.3 mmol/L (ref 3.5–5.2)
Sodium: 138 mmol/L (ref 134–144)
Total Protein: 7.5 g/dL (ref 6.0–8.5)
eGFR: 79 mL/min/{1.73_m2} (ref >59)

## 2020-12-02 LAB — TSH: TSH: 2.51 u[IU]/mL (ref 0.450–4.500)

## 2020-12-02 LAB — CBC WITH DIFFERENTIAL/PLATELET
Basophils Absolute: 0 10*3/uL (ref 0.0–0.2)
Basos: 1 %
EOS (ABSOLUTE): 0.2 10*3/uL (ref 0.0–0.4)
Eos: 4 %
Hematocrit: 42.5 % (ref 37.5–51.0)
Hemoglobin: 13.9 g/dL (ref 13.0–17.7)
Immature Grans (Abs): 0 10*3/uL (ref 0.0–0.1)
Immature Granulocytes: 0 %
Lymphocytes Absolute: 2.2 10*3/uL (ref 0.7–3.1)
Lymphs: 48 %
MCH: 29.1 pg (ref 26.6–33.0)
MCHC: 32.7 g/dL (ref 31.5–35.7)
MCV: 89 fL (ref 79–97)
Monocytes Absolute: 0.5 10*3/uL (ref 0.1–0.9)
Monocytes: 11 %
Neutrophils Absolute: 1.7 10*3/uL (ref 1.4–7.0)
Neutrophils: 36 %
Platelets: 313 10*3/uL (ref 150–450)
RBC: 4.78 x10E6/uL (ref 4.14–5.80)
RDW: 13.1 % (ref 11.6–15.4)
WBC: 4.6 10*3/uL (ref 3.4–10.8)

## 2020-12-02 LAB — INSULIN, RANDOM: INSULIN: 9.4 u[IU]/mL (ref 2.6–24.9)

## 2020-12-02 LAB — T3: T3, Total: 156 ng/dL (ref 71–180)

## 2020-12-02 LAB — HEMOGLOBIN A1C
Est. average glucose Bld gHb Est-mCnc: 143 mg/dL
Hgb A1c MFr Bld: 6.6 % — ABNORMAL HIGH (ref 4.8–5.6)

## 2020-12-02 LAB — FOLATE: Folate: 12.1 ng/mL (ref >3.0)

## 2020-12-02 LAB — VITAMIN B12: Vitamin B-12: 637 pg/mL (ref 232–1245)

## 2020-12-02 LAB — T4: T4, Total: 10.2 ug/dL (ref 4.5–12.0)

## 2020-12-02 LAB — VITAMIN D 25 HYDROXY (VIT D DEFICIENCY, FRACTURES): Vit D, 25-Hydroxy: 21.2 ng/mL — ABNORMAL LOW (ref 30.0–100.0)

## 2020-12-15 ENCOUNTER — Encounter (INDEPENDENT_AMBULATORY_CARE_PROVIDER_SITE_OTHER): Payer: Self-pay | Admitting: Family Medicine

## 2020-12-15 ENCOUNTER — Other Ambulatory Visit: Payer: Self-pay

## 2020-12-15 ENCOUNTER — Ambulatory Visit (INDEPENDENT_AMBULATORY_CARE_PROVIDER_SITE_OTHER): Payer: BC Managed Care – PPO | Admitting: Family Medicine

## 2020-12-15 VITALS — BP 136/78 | HR 77 | Temp 97.5°F | Ht 74.0 in | Wt 324.0 lb

## 2020-12-15 DIAGNOSIS — E559 Vitamin D deficiency, unspecified: Secondary | ICD-10-CM | POA: Diagnosis not present

## 2020-12-15 DIAGNOSIS — E1169 Type 2 diabetes mellitus with other specified complication: Secondary | ICD-10-CM

## 2020-12-15 DIAGNOSIS — E7849 Other hyperlipidemia: Secondary | ICD-10-CM | POA: Diagnosis not present

## 2020-12-15 DIAGNOSIS — Z9189 Other specified personal risk factors, not elsewhere classified: Secondary | ICD-10-CM

## 2020-12-15 DIAGNOSIS — Z6841 Body Mass Index (BMI) 40.0 and over, adult: Secondary | ICD-10-CM

## 2020-12-15 MED ORDER — VITAMIN D (ERGOCALCIFEROL) 1.25 MG (50000 UNIT) PO CAPS: 50000.0000 [IU] | ORAL_CAPSULE | ORAL | 0 refills | Status: DC

## 2020-12-20 NOTE — Progress Notes (Signed)
Chief Complaint:   Charles Watkins (MR# [id number]) is a 55 y.o. male who presents for evaluation and treatment of obesity and related comorbidities. Current BMI is Body mass index is 42.11 kg/m. Daekwon has been struggling with his weight for many years and has been unsuccessful in either losing weight, maintaining weight loss, or reaching his healthy weight goal.  Davie is currently in the action stage of change and ready to dedicate time achieving and maintaining a healthier weight. Kalub is interested in becoming our patient and working on intensive lifestyle modifications including (but not limited to) diet and exercise for weight loss.  Jjesus's habits were reviewed today and are as follows: he thinks his family will eat healthier with him, his desired weight loss is 48 lbs, he started gaining weight in the last 5 years, his heaviest weight ever was 335 pounds, he is a picky eater and doesn't like to eat healthier foods, he has significant food cravings issues, he skips meals frequently, he is frequently drinking liquids with calories, he frequently makes poor food choices, he frequently eats larger portions than normal and he struggles with emotional eating.  Depression Screen Takoda's Food and Mood (modified PHQ-9) score was 6.  Depression screen [facility] 2/9 12/01/2020  Decreased Interest 0  Down, Depressed, Hopeless 0  PHQ - 2 Score 0  Altered sleeping 0  Tired, decreased energy 3  Change in appetite 0  Feeling bad or failure about yourself  0  Trouble concentrating 3  Moving slowly or fidgety/restless 0  Suicidal thoughts 0  PHQ-9 Score 6  Difficult doing work/chores Not difficult at all   Subjective:   1. Other fatigue Lark admits to daytime somnolence and admits to waking up still tired. Patent has a history of symptoms of daytime fatigue. Levaughn generally gets 6 or 8 hours of sleep per night, and states that he has generally restful sleep. Snoring is  present. Apneic episodes are not present. Epworth Sleepiness Score is 13.  2. Shortness of breath on exertion Mallie Mussel notes increasing shortness of breath with exercising and seems to be worsening over time with weight gain. He notes getting out of breath sooner with activity than he used to. This has not gotten worse recently. Emanual denies shortness of breath at rest or orthopnea.  3. Type 2 diabetes mellitus with other specified complication, without long-term current use of insulin ([place]) Cherry is not checking his BGs at home. He is on metformin, and his last A1c was 6.4.  4. Essential hypertension Lashaun's blood pressure is controlled on his medications. He denies chest pain or headache.  5. Hyperlipidemia associated with type 2 diabetes mellitus ([place]) Monty is on Lipitor, and he is working on diet. He is due for labs.  6. At risk for heart disease Valentin is at a higher than average risk for cardiovascular disease due to obesity.   Assessment/Plan:   1. Other fatigue Demareon does feel that his weight is causing his energy to be lower than it should be. Fatigue may be related to obesity, depression or many other causes. Labs will be ordered, and in the meanwhile, Dylann will focus on self care including making healthy food choices, increasing physical activity and focusing on stress reduction.  - Vitamin B12 - CBC with Differential/Platelet - EKG 12-Lead - Folate - T3 - T4 - TSH - VITAMIN D 25 Hydroxy (Vit-D Deficiency, Fractures)  2. Shortness of breath on exertion Link does feel that  he gets out of breath more easily that he used to when he exercises. Yehudah's shortness of breath appears to be obesity related and exercise induced. He has agreed to work on weight loss and gradually increase exercise to treat his exercise induced shortness of breath. Will continue to monitor closely.  3. Type 2 diabetes mellitus with other specified complication, without long-term current use of insulin  (HCC) Good blood sugar control is important to decrease the likelihood of diabetic complications such as nephropathy, neuropathy, limb loss, blindness, coronary artery disease, and death. Intensive lifestyle modification including diet, exercise and weight loss are the first line of treatment for diabetes. We will check labs today. Akia will continue metformin, and will start his Category 3 plan.  - Hemoglobin A1c - Insulin, random  4. Essential hypertension Birney will start on his Category 3 plan, and will work on healthy weight loss and exercise to improve blood pressure control. We will watch for signs of hypotension as he continues his lifestyle modifications. We will check labs today.  - Comprehensive metabolic panel  5. Hyperlipidemia associated with type 2 diabetes mellitus ([place]) Cardiovascular risk and specific lipid/LDL goals reviewed. We discussed several lifestyle modifications today. Zhane will continue his medications, and he will start his Category 3 plan, work on diet, exercise and weight loss efforts. We will check labs today. Orders and follow up as documented in patient record.   - Lipid Panel With LDL/HDL Ratio  6. Screening for depression Aadarsh had a positive depression screening. Depression is commonly associated with obesity and often results in emotional eating behaviors. We will monitor this closely and work on CBT to help improve the non-hunger eating patterns. Referral to Psychology may be required if no improvement is seen as he continues in our clinic.  7. At risk for heart disease Torrin was given approximately 30 minutes of coronary artery disease prevention counseling today. He is 54 y.o. male and has risk factors for heart disease including obesity. We discussed intensive lifestyle modifications today with an emphasis on specific weight loss instructions and strategies.   Repetitive spaced learning was employed today to elicit superior memory formation and  behavioral change.  8. Class 3 severe obesity with serious comorbidity and body mass index (BMI) of 40.0 to 44.9 in adult, unspecified obesity type (HCC) Adriaan is currently in the action stage of change and his goal is to continue with weight loss efforts. I recommend Larue begin the structured treatment plan as follows:  He has agreed to the Category 3 Plan.  Exercise goals: No exercise has been prescribed for now, while we concentrate on nutritional changes.   Behavioral modification strategies: increasing lean protein intake and no skipping meals.  He was informed of the importance of frequent follow-up visits to maximize his success with intensive lifestyle modifications for his multiple health conditions. He was informed we would discuss his lab results at his next visit unless there is a critical issue that needs to be addressed sooner. Lyndal agreed to keep his next visit at the agreed upon time to discuss these results.  Objective:   Blood pressure 130/69, pulse 83, temperature 97.8 F (36.6 C), height 6\' 2"  (1.88 m), weight (!) 328 lb (148.8 kg), SpO2 97 %. Body mass index is 42.11 kg/m.  EKG: Normal sinus rhythm, rate 86 BPM.  Indirect Calorimeter completed today shows a VO2 of 393 and a REE of 2734.  His calculated basal metabolic rate is 3875 thus his basal metabolic rate is  worse than expected.  General: Cooperative, alert, well developed, in no acute distress. HEENT: Conjunctivae and lids unremarkable. Cardiovascular: Regular rhythm.  Lungs: Normal work of breathing. Neurologic: No focal deficits.   Lab Results  Component Value Date   CREATININE 1.11 12/01/2020   BUN 13 12/01/2020   NA 138 12/01/2020   K 4.3 12/01/2020   CL 102 12/01/2020   CO2 21 12/01/2020   Lab Results  Component Value Date   ALT 23 12/01/2020   AST 23 12/01/2020   ALKPHOS 60 12/01/2020   BILITOT 0.3 12/01/2020   Lab Results  Component Value Date   HGBA1C 6.6 (H) 12/01/2020   HGBA1C 6.4  08/22/2020   HGBA1C 5.9 (A) 02/19/2020   HGBA1C 6.1 11/18/2019   HGBA1C 6.0 08/11/2019   Lab Results  Component Value Date   INSULIN 9.4 12/01/2020   Lab Results  Component Value Date   TSH 2.510 12/01/2020   Lab Results  Component Value Date   CHOL 139 12/01/2020   HDL 40 12/01/2020   LDLCALC 74 12/01/2020   LDLDIRECT 117.0 08/10/2016   TRIG 143 12/01/2020   CHOLHDL 3 08/22/2020   Lab Results  Component Value Date   WBC 4.6 12/01/2020   HGB 13.9 12/01/2020   HCT 42.5 12/01/2020   MCV 89 12/01/2020   PLT 313 12/01/2020   No results found for: IRON, TIBC, FERRITIN  Attestation Statements:   Reviewed by clinician on day of visit: allergies, medications, problem list, medical history, surgical history, family history, social history, and previous encounter notes.   I, Trixie Dredge, am acting as transcriptionist for Dennard Nip, MD.  I have reviewed the above documentation for accuracy and completeness, and I agree with the above. - Dennard Nip, MD

## 2020-12-22 NOTE — Progress Notes (Signed)
Chief Complaint:   OBESITY Charles Watkins is here to discuss his progress with his obesity treatment plan along with follow-up of his obesity related diagnoses. Charles Watkins is on the Category 3 Plan and states he is following his eating plan approximately 98% of the time. Charles Watkins states he is doing 0 minutes 0 times per week.  Today's visit was #: 2 Starting weight: 328 lbs Starting date: 12/01/2020 Today's weight: 324 lbs Today's date: 12/15/2020 Total lbs lost to date: 4 Total lbs lost since last in-office visit: 4  Interim History: Charles Watkins has done well on his Category 4 plan. He misses meat at breakfast, but he has done well eating all of the food on his plan. His hunger is mostly controlled.  Subjective:   1. Type 2 diabetes mellitus with other specified complication, without long-term current use of insulin (HCC) Charles Watkins's A1c is slightly elevated to 6.6. He is tolerating metformin well with no GI upset. He is doing very well with his decreased simple carbohydrate diet. I discussed labs with the patient today.  2. Vitamin D deficiency Charles Watkins has a new diagnosis of Vit D deficiency. His Vit D level is below goal, and he notes fatigue. He is not on Vit D supplementation. I discussed labs with the patient today.  3. Other hyperlipidemia Charles Watkins is stable on Lipitor, and he is doing well with diet. He is on a lower saturated fat diet which should help control his cholesterol levels. I discussed labs with the patient today.  4. At risk for heart disease Charles Watkins is at a higher than average risk for cardiovascular disease due to obesity.   Assessment/Plan:   1. Type 2 diabetes mellitus with other specified complication, without long-term current use of insulin (Charles Watkins) Charles Watkins will continue metformin as is, and will continue diet, exercise, and weight loss. Good blood sugar control is important to decrease the likelihood of diabetic complications such as nephropathy, neuropathy, limb loss, blindness, coronary  artery disease, and death. Intensive lifestyle modification including diet, exercise and weight loss are the first line of treatment for diabetes.   2. Vitamin D deficiency Low Vitamin D level contributes to fatigue and are associated with obesity, breast, and colon cancer. Charles Watkins agreed to start prescription Vitamin D 50,000 IU every week with no refills. He will follow-up for routine testing of Vitamin D, at least 2-3 times per year to avoid over-replacement.  - Vitamin D, Ergocalciferol, (DRISDOL) 1.25 MG (50000 UNIT) CAPS capsule; Take 1 capsule (50,000 Units total) by mouth every 7 (seven) days.  Dispense: 4 capsule; Refill: 0  3. Other hyperlipidemia Cardiovascular risk and specific lipid/LDL goals reviewed. We discussed several lifestyle modifications today. Charles Watkins will continue Lipitor, and will continue to work on diet, exercise and weight loss efforts. Orders and follow up as documented in patient record.   4. At risk for heart disease Charles Watkins was given approximately 30 minutes of coronary artery disease prevention counseling today. He is 55 y.o. male and has risk factors for heart disease including obesity. We discussed intensive lifestyle modifications today with an emphasis on specific weight loss instructions and strategies.   Repetitive spaced learning was employed today to elicit superior memory formation and behavioral change.  5. Obesity with current BMI of 41.6 Charles Watkins is currently in the action stage of change. As such, his goal is to continue with weight loss efforts. He has agreed to the Category 4 Plan.   Charles Watkins is ok to add Charles Watkins sausage links  to his breakfast.  Behavioral modification strategies: increasing lean protein intake, meal planning and cooking strategies and better snacking choices.  Charles Watkins has agreed to follow-up with our clinic in 2 weeks. He was informed of the importance of frequent follow-up visits to maximize his success with intensive lifestyle  modifications for his multiple health conditions.   Objective:   Blood pressure 136/78, pulse 77, temperature (!) 97.5 F (36.4 C), height 6\' 2"  (1.88 m), weight (!) 324 lb (147 kg), SpO2 97 %. Body mass index is 41.6 kg/m.  General: Cooperative, alert, well developed, in no acute distress. HEENT: Conjunctivae and lids unremarkable. Cardiovascular: Regular rhythm.  Lungs: Normal work of breathing. Neurologic: No focal deficits.   Lab Results  Component Value Date   CREATININE 1.11 12/01/2020   BUN 13 12/01/2020   NA 138 12/01/2020   K 4.3 12/01/2020   CL 102 12/01/2020   CO2 21 12/01/2020   Lab Results  Component Value Date   ALT 23 12/01/2020   AST 23 12/01/2020   ALKPHOS 60 12/01/2020   BILITOT 0.3 12/01/2020   Lab Results  Component Value Date   HGBA1C 6.6 (H) 12/01/2020   HGBA1C 6.4 08/22/2020   HGBA1C 5.9 (A) 02/19/2020   HGBA1C 6.1 11/18/2019   HGBA1C 6.0 08/11/2019   Lab Results  Component Value Date   INSULIN 9.4 12/01/2020   Lab Results  Component Value Date   TSH 2.510 12/01/2020   Lab Results  Component Value Date   CHOL 139 12/01/2020   HDL 40 12/01/2020   LDLCALC 74 12/01/2020   LDLDIRECT 117.0 08/10/2016   TRIG 143 12/01/2020   CHOLHDL 3 08/22/2020   Lab Results  Component Value Date   WBC 4.6 12/01/2020   HGB 13.9 12/01/2020   HCT 42.5 12/01/2020   MCV 89 12/01/2020   PLT 313 12/01/2020   No results found for: IRON, TIBC, FERRITIN  Attestation Statements:   Reviewed by clinician on day of visit: allergies, medications, problem list, medical history, surgical history, family history, social history, and previous encounter notes.   I, Trixie Dredge, am acting as transcriptionist for Dennard Nip, MD.  I have reviewed the above documentation for accuracy and completeness, and I agree with the above. -  Dennard Nip, MD

## 2020-12-29 ENCOUNTER — Ambulatory Visit (INDEPENDENT_AMBULATORY_CARE_PROVIDER_SITE_OTHER): Payer: BC Managed Care – PPO | Admitting: Physician Assistant

## 2020-12-29 ENCOUNTER — Encounter (INDEPENDENT_AMBULATORY_CARE_PROVIDER_SITE_OTHER): Payer: Self-pay | Admitting: Physician Assistant

## 2020-12-29 ENCOUNTER — Other Ambulatory Visit: Payer: Self-pay

## 2020-12-29 VITALS — BP 138/82 | HR 86 | Temp 98.3°F | Ht 74.0 in | Wt 319.0 lb

## 2020-12-29 DIAGNOSIS — Z6841 Body Mass Index (BMI) 40.0 and over, adult: Secondary | ICD-10-CM | POA: Diagnosis not present

## 2020-12-29 DIAGNOSIS — E7849 Other hyperlipidemia: Secondary | ICD-10-CM | POA: Diagnosis not present

## 2020-12-29 DIAGNOSIS — E1169 Type 2 diabetes mellitus with other specified complication: Secondary | ICD-10-CM

## 2020-12-29 DIAGNOSIS — Z9189 Other specified personal risk factors, not elsewhere classified: Secondary | ICD-10-CM

## 2020-12-29 MED ORDER — FENOFIBRATE MICRONIZED 134 MG PO CAPS: 134.0000 mg | ORAL_CAPSULE | Freq: Every day | ORAL | 3 refills | Status: DC

## 2021-01-03 NOTE — Progress Notes (Signed)
Chief Complaint:   OBESITY Charles Watkins is here to discuss his progress with his obesity treatment plan along with follow-up of his obesity related diagnoses. Charles Watkins is on the Category 4 Plan and states he is following his eating plan approximately 80% of the time. Charles Watkins states he is walking 1 mile 3 times per week.  Today's visit was #: 3 Starting weight: 328 lbs Starting date: 12/01/2020 Today's weight: 319 lbs Today's date: 12/29/2020 Total lbs lost to date: 9 Total lbs lost since last in-office visit: 5  Interim History: Charles Watkins did well with weight loss. He is following the plan well. He is not excessively hungry between meals. He is missing potatoes.  Subjective:   1. Type 2 diabetes mellitus with other specified complication, without long-term current use of insulin (HCC) Charles Watkins is on metformin, and his last A1c was 6.6. He denies polyphagia.  2. Other hyperlipidemia Charles Watkins is on atorvastatin, and he denies chest pain or myalgias. He is exercising regularly, and he is also on [place].  3. At risk for heart disease Charles Watkins is at a higher than average risk for cardiovascular disease due to obesity.   Assessment/Plan:   1. Type 2 diabetes mellitus with other specified complication, without long-term current use of insulin ([place]) Charles Watkins will continue metformin and weight loss. Good blood sugar control is important to decrease the likelihood of diabetic complications such as nephropathy, neuropathy, limb loss, blindness, coronary artery disease, and death. Intensive lifestyle modification including diet, exercise and weight loss are the first line of treatment for diabetes.   2. Other hyperlipidemia Cardiovascular risk and specific lipid/LDL goals reviewed. We discussed several lifestyle modifications today. Charles Watkins will continue to work on diet, exercise and weight loss efforts. We will refill Fofibra for 90 days with 3 refills. Orders and follow up as documented in patient record.    Counseling Intensive lifestyle modifications are the first line treatment for this issue. . Dietary changes: Increase soluble fiber. Decrease simple carbohydrates. . Exercise changes: Moderate to vigorous-intensity aerobic activity 150 minutes per week if tolerated. . Lipid-lowering medications: see documented in medical record.  - fenofibrate micronized (LOFIBRA) 134 MG capsule; Take 1 capsule (134 mg total) by mouth daily.  Dispense: 90 capsule; Refill: 3  3. At risk for heart disease Charles Watkins was given approximately 15 minutes of coronary artery disease prevention counseling today. He is 55 y.o. male and has risk factors for heart disease including obesity. We discussed intensive lifestyle modifications today with an emphasis on specific weight loss instructions and strategies.   Repetitive spaced learning was employed today to elicit superior memory formation and behavioral change.  4. Class 3 severe obesity with serious comorbidity and body mass index (BMI) of 40.0 to 44.9 in adult, unspecified obesity type ([place]) Charles Watkins is currently in the action stage of change. As such, his goal is to continue with weight loss efforts. He has agreed to the Category 4 Plan.   Exercise goals: As is.  Behavioral modification strategies: travel eating strategies and avoiding temptations.  Charles Watkins has agreed to follow-up with our clinic in 2 weeks. He was informed of the importance of frequent follow-up visits to maximize his success with intensive lifestyle modifications for his multiple health conditions.   Objective:   Blood pressure 138/82, pulse 86, temperature 98.3 F (36.8 C), height 6\' 2"  (1.88 m), weight (!) 319 lb (144.7 kg), SpO2 97 %. Body mass index is 40.96 kg/m.  General: Cooperative, alert, well developed, in no acute distress.  HEENT: Conjunctivae and lids unremarkable. Cardiovascular: Regular rhythm.  Lungs: Normal work of breathing. Neurologic: No focal deficits.   Lab Results   Component Value Date   CREATININE 1.11 12/01/2020   BUN 13 12/01/2020   NA 138 12/01/2020   K 4.3 12/01/2020   CL 102 12/01/2020   CO2 21 12/01/2020   Lab Results  Component Value Date   ALT 23 12/01/2020   AST 23 12/01/2020   ALKPHOS 60 12/01/2020   BILITOT 0.3 12/01/2020   Lab Results  Component Value Date   HGBA1C 6.6 (H) 12/01/2020   HGBA1C 6.4 08/22/2020   HGBA1C 5.9 (A) 02/19/2020   HGBA1C 6.1 11/18/2019   HGBA1C 6.0 08/11/2019   Lab Results  Component Value Date   INSULIN 9.4 12/01/2020   Lab Results  Component Value Date   TSH 2.510 12/01/2020   Lab Results  Component Value Date   CHOL 139 12/01/2020   HDL 40 12/01/2020   LDLCALC 74 12/01/2020   LDLDIRECT 117.0 08/10/2016   TRIG 143 12/01/2020   CHOLHDL 3 08/22/2020   Lab Results  Component Value Date   WBC 4.6 12/01/2020   HGB 13.9 12/01/2020   HCT 42.5 12/01/2020   MCV 89 12/01/2020   PLT 313 12/01/2020   No results found for: IRON, TIBC, FERRITIN  Attestation Statements:   Reviewed by clinician on day of visit: allergies, medications, problem list, medical history, surgical history, family history, social history, and previous encounter notes.   Wilhemena Durie, am acting as transcriptionist for [employer], PA-C.  I have reviewed the above documentation for accuracy and completeness, and I agree with the above. Abby Potash, PA-C

## 2021-01-11 ENCOUNTER — Other Ambulatory Visit (INDEPENDENT_AMBULATORY_CARE_PROVIDER_SITE_OTHER): Payer: Self-pay | Admitting: Family Medicine

## 2021-01-11 DIAGNOSIS — E559 Vitamin D deficiency, unspecified: Secondary | ICD-10-CM

## 2021-01-11 NOTE — Telephone Encounter (Signed)
Pt last seen by Tracey Aguilar, PA-C.  

## 2021-01-12 ENCOUNTER — Encounter (INDEPENDENT_AMBULATORY_CARE_PROVIDER_SITE_OTHER): Payer: Self-pay | Admitting: Family Medicine

## 2021-01-12 ENCOUNTER — Ambulatory Visit (INDEPENDENT_AMBULATORY_CARE_PROVIDER_SITE_OTHER): Payer: BC Managed Care – PPO | Admitting: Family Medicine

## 2021-01-12 ENCOUNTER — Other Ambulatory Visit: Payer: Self-pay

## 2021-01-12 VITALS — BP 133/73 | HR 72 | Temp 98.1°F | Ht 74.0 in | Wt 315.0 lb

## 2021-01-12 DIAGNOSIS — Z9189 Other specified personal risk factors, not elsewhere classified: Secondary | ICD-10-CM

## 2021-01-12 DIAGNOSIS — E559 Vitamin D deficiency, unspecified: Secondary | ICD-10-CM | POA: Diagnosis not present

## 2021-01-12 DIAGNOSIS — Z6841 Body Mass Index (BMI) 40.0 and over, adult: Secondary | ICD-10-CM

## 2021-01-12 DIAGNOSIS — K5900 Constipation, unspecified: Secondary | ICD-10-CM | POA: Diagnosis not present

## 2021-01-12 DIAGNOSIS — E66813 Obesity, class 3: Secondary | ICD-10-CM

## 2021-01-12 DIAGNOSIS — E1169 Type 2 diabetes mellitus with other specified complication: Secondary | ICD-10-CM

## 2021-01-12 MED ORDER — VITAMIN D (ERGOCALCIFEROL) 1.25 MG (50000 UNIT) PO CAPS: 50000.0000 [IU] | ORAL_CAPSULE | ORAL | 0 refills | Status: DC

## 2021-01-16 NOTE — Progress Notes (Signed)
Chief Complaint:   OBESITY Charles Watkins is here to discuss his progress with his obesity treatment plan along with follow-up of his obesity related diagnoses. Charles Watkins is on the Category 4 Plan and states he is following his eating plan approximately 80% of the time. Charles Watkins states he is doing 0 minutes 0 times per week.  Today's visit was #: 4 Starting weight: 328 lbs Starting date: 12/01/2020 Today's weight: 315 lbs Today's date: 01/12/2021 Total lbs lost to date: 13 Total lbs lost since last in-office visit: 4  Interim History: Charles Watkins continues to do well with weight loss. He sometimes struggles to eat all of his protein at dinner though. His hunger is controlled.  Subjective:   1. Type 2 diabetes mellitus with other specified complication, without long-term current use of insulin (HCC) Charles Watkins's last A1c was 6.6. He is doing well with changing his diet and with weight loss. He denies signs of hypoglycemia.  2. Vitamin D deficiency Charles Watkins is stable on Vit D, but his level is not yet at goal. He denies nausea or vomiting.  3. Constipation, unspecified constipation type Charles Watkins notes decrease BM frequency with his eating plan and weight loss. His BM are not really hard or painful.  4. At high risk for dehydration Charles Watkins is at risk for dehydration due to inadequate water intake.  Assessment/Plan:   1. Type 2 diabetes mellitus with other specified complication, without long-term current use of insulin ([place]) Charles Watkins will continue with diet, exercise, and metformin, and will continue to follow up as directed. Good blood sugar control is important to decrease the likelihood of diabetic complications such as nephropathy, neuropathy, limb loss, blindness, coronary artery disease, and death. Intensive lifestyle modification including diet, exercise and weight loss are the first line of treatment for diabetes.   2. Vitamin D deficiency Low Vitamin D level contributes to fatigue and are associated with  obesity, breast, and colon cancer. We will refill prescription Vitamin D for 1 month. Charles Watkins will follow-up for routine testing of Vitamin D, at least 2-3 times per year to avoid over-replacement.  - Vitamin D, Ergocalciferol, (DRISDOL) 1.25 MG (50000 UNIT) CAPS capsule; Take 1 capsule (50,000 Units total) by mouth every 7 (seven) days.  Dispense: 4 capsule; Refill: 0  3. Constipation, unspecified constipation type Charles Watkins was advised that decreased BM frequency is normal with weight loss, and continue to monitor this as well as increase his water and vegetable intake. Orders and follow up as documented in patient record.   4. At high risk for dehydration Charles Watkins was given approximately 15 minutes dehydration prevention counseling today. Charles Watkins is at risk for dehydration due to weight loss and current medication(s). He was encouraged to hydrate and monitor fluid status to avoid dehydration as well as weight loss plateaus.   5. Obesity with current BMI 40.5 Charles Watkins is currently in the action stage of change. As such, his goal is to continue with weight loss efforts. He has agreed to the Category 4 Plan with lean meat options.   Exercise goals: All adults should avoid inactivity. Some physical activity is better than none, and adults who participate in any amount of physical activity gain some health benefits.  Behavioral modification strategies: increasing lean protein intake, increasing vegetables and increasing water intake.  Charles Watkins has agreed to follow-up with our clinic in 2 weeks. He was informed of the importance of frequent follow-up visits to maximize his success with intensive lifestyle modifications for his multiple health conditions.   Objective:  Blood pressure 133/73, pulse 72, temperature 98.1 F (36.7 C), temperature source Oral, height 6\' 2"  (1.88 m), weight (!) 315 lb (142.9 kg), SpO2 97 %. Body mass index is 40.44 kg/m.  General: Cooperative, alert, well developed, in no acute  distress. HEENT: Conjunctivae and lids unremarkable. Cardiovascular: Regular rhythm.  Lungs: Normal work of breathing. Neurologic: No focal deficits.   Lab Results  Component Value Date   CREATININE 1.11 12/01/2020   BUN 13 12/01/2020   NA 138 12/01/2020   K 4.3 12/01/2020   CL 102 12/01/2020   CO2 21 12/01/2020   Lab Results  Component Value Date   ALT 23 12/01/2020   AST 23 12/01/2020   ALKPHOS 60 12/01/2020   BILITOT 0.3 12/01/2020   Lab Results  Component Value Date   HGBA1C 6.6 (H) 12/01/2020   HGBA1C 6.4 08/22/2020   HGBA1C 5.9 (A) 02/19/2020   HGBA1C 6.1 11/18/2019   HGBA1C 6.0 08/11/2019   Lab Results  Component Value Date   INSULIN 9.4 12/01/2020   Lab Results  Component Value Date   TSH 2.510 12/01/2020   Lab Results  Component Value Date   CHOL 139 12/01/2020   HDL 40 12/01/2020   LDLCALC 74 12/01/2020   LDLDIRECT 117.0 08/10/2016   TRIG 143 12/01/2020   CHOLHDL 3 08/22/2020   Lab Results  Component Value Date   WBC 4.6 12/01/2020   HGB 13.9 12/01/2020   HCT 42.5 12/01/2020   MCV 89 12/01/2020   PLT 313 12/01/2020   No results found for: IRON, TIBC, FERRITIN  Attestation Statements:   Reviewed by clinician on day of visit: allergies, medications, problem list, medical history, surgical history, family history, social history, and previous encounter notes.   I, Trixie Dredge, am acting as transcriptionist for Dennard Nip, MD.  I have reviewed the above documentation for accuracy and completeness, and I agree with the above. -  Dennard Nip, MD

## 2021-01-26 ENCOUNTER — Encounter (INDEPENDENT_AMBULATORY_CARE_PROVIDER_SITE_OTHER): Payer: Self-pay | Admitting: Physician Assistant

## 2021-01-26 ENCOUNTER — Ambulatory Visit (INDEPENDENT_AMBULATORY_CARE_PROVIDER_SITE_OTHER): Payer: BC Managed Care – PPO | Admitting: Physician Assistant

## 2021-01-26 ENCOUNTER — Other Ambulatory Visit: Payer: Self-pay

## 2021-01-26 VITALS — BP 127/75 | HR 76 | Temp 98.4°F | Ht 74.0 in | Wt 315.0 lb

## 2021-01-26 DIAGNOSIS — E785 Hyperlipidemia, unspecified: Secondary | ICD-10-CM | POA: Diagnosis not present

## 2021-01-26 DIAGNOSIS — Z6841 Body Mass Index (BMI) 40.0 and over, adult: Secondary | ICD-10-CM

## 2021-01-26 DIAGNOSIS — E1169 Type 2 diabetes mellitus with other specified complication: Secondary | ICD-10-CM

## 2021-01-31 NOTE — Progress Notes (Signed)
Chief Complaint:   OBESITY Charles Watkins is here to discuss his progress with his obesity treatment plan along with follow-up of his obesity related diagnoses. Charles Watkins is on the Category 4 Plan with lean meat options and states he is following his eating plan approximately 85-90% of the time. Charles Watkins states he is walking for 15 minutes 3 times per week.  Today's visit was #: 5 Starting weight: 328 lbs Starting date: 12/01/2020 Today's weight: 315 lbs Today's date: 01/26/2021 Total lbs lost to date: 13 Total lbs lost since last in-office visit: 0  Interim History: Charles Watkins finds himself hungry sometimes between breakfast and lunch due to the time frame between those meals. He is very active at work. He is overeating his snack calories some days.  Subjective:   1. Type 2 diabetes mellitus with other specified complication, without long-term current use of insulin (HCC) Charles Watkins is taking metformin as prescribed, and he denies hypoglycemia. He snacks more in the afternoon.  2. Hyperlipidemia associated with type 2 diabetes mellitus ([place]) Charles Watkins is on fenofibrate and Lipitor. He denies chest pain or myalgias.  Assessment/Plan:   1. Type 2 diabetes mellitus with other specified complication, without long-term current use of insulin (HCC) Charles Watkins is to take metformin with lunch and dinner. Good blood sugar control is important to decrease the likelihood of diabetic complications such as nephropathy, neuropathy, limb loss, blindness, coronary artery disease, and death. Intensive lifestyle modification including diet, exercise and weight loss are the first line of treatment for diabetes.   2. Hyperlipidemia associated with type 2 diabetes mellitus ([place]) Cardiovascular risk and specific lipid/LDL goals reviewed. We discussed several lifestyle modifications today. Charles Watkins will continue with his medications, and will continue to work on diet, exercise and weight loss efforts. Orders and follow up as documented in  patient record.   Counseling Intensive lifestyle modifications are the first line treatment for this issue. . Dietary changes: Increase soluble fiber. Decrease simple carbohydrates. . Exercise changes: Moderate to vigorous-intensity aerobic activity 150 minutes per week if tolerated. . Lipid-lowering medications: see documented in medical record.  3. BMI 40.0-44.9, adult ([place]) Charles Watkins is currently in the action stage of change. As such, his goal is to continue with weight loss efforts. He has agreed to the Category 4 Plan modified for more protein.   Exercise goals: As is.  Behavioral modification strategies: meal planning and cooking strategies and better snacking choices.  Charles Watkins has agreed to follow-up with our clinic in 3 weeks. He was informed of the importance of frequent follow-up visits to maximize his success with intensive lifestyle modifications for his multiple health conditions.   Objective:   Blood pressure 127/75, pulse 76, temperature 98.4 F (36.9 C), height 6\' 2"  (1.88 m), weight (!) 315 lb (142.9 kg), SpO2 98 %. Body mass index is 40.44 kg/m.  General: Cooperative, alert, well developed, in no acute distress. HEENT: Conjunctivae and lids unremarkable. Cardiovascular: Regular rhythm.  Lungs: Normal work of breathing. Neurologic: No focal deficits.   Lab Results  Component Value Date   CREATININE 1.11 12/01/2020   BUN 13 12/01/2020   NA 138 12/01/2020   K 4.3 12/01/2020   CL 102 12/01/2020   CO2 21 12/01/2020   Lab Results  Component Value Date   ALT 23 12/01/2020   AST 23 12/01/2020   ALKPHOS 60 12/01/2020   BILITOT 0.3 12/01/2020   Lab Results  Component Value Date   HGBA1C 6.6 (H) 12/01/2020   HGBA1C 6.4 08/22/2020  HGBA1C 5.9 (A) 02/19/2020   HGBA1C 6.1 11/18/2019   HGBA1C 6.0 08/11/2019   Lab Results  Component Value Date   INSULIN 9.4 12/01/2020   Lab Results  Component Value Date   TSH 2.510 12/01/2020   Lab Results  Component Value  Date   CHOL 139 12/01/2020   HDL 40 12/01/2020   LDLCALC 74 12/01/2020   LDLDIRECT 117.0 08/10/2016   TRIG 143 12/01/2020   CHOLHDL 3 08/22/2020   Lab Results  Component Value Date   WBC 4.6 12/01/2020   HGB 13.9 12/01/2020   HCT 42.5 12/01/2020   MCV 89 12/01/2020   PLT 313 12/01/2020   No results found for: IRON, TIBC, FERRITIN  Attestation Statements:   Reviewed by clinician on day of visit: allergies, medications, problem list, medical history, surgical history, family history, social history, and previous encounter notes.  Time spent on visit including pre-visit chart review and post-visit care and charting was 31 minutes.    Wilhemena Durie, am acting as transcriptionist for [employer], PA-C.  I have reviewed the above documentation for accuracy and completeness, and I agree with the above. Abby Potash, PA-C

## 2021-02-09 ENCOUNTER — Other Ambulatory Visit (INDEPENDENT_AMBULATORY_CARE_PROVIDER_SITE_OTHER): Payer: Self-pay | Admitting: Family Medicine

## 2021-02-09 DIAGNOSIS — E559 Vitamin D deficiency, unspecified: Secondary | ICD-10-CM

## 2021-02-09 NOTE — Telephone Encounter (Signed)
Last seen by Abby Potash

## 2021-02-13 ENCOUNTER — Other Ambulatory Visit: Payer: Self-pay

## 2021-02-13 ENCOUNTER — Encounter (INDEPENDENT_AMBULATORY_CARE_PROVIDER_SITE_OTHER): Payer: Self-pay | Admitting: Family Medicine

## 2021-02-13 ENCOUNTER — Ambulatory Visit (INDEPENDENT_AMBULATORY_CARE_PROVIDER_SITE_OTHER): Payer: BC Managed Care – PPO | Admitting: Family Medicine

## 2021-02-13 VITALS — BP 122/71 | HR 78 | Temp 97.8°F | Ht 74.0 in | Wt 313.0 lb

## 2021-02-13 DIAGNOSIS — E1169 Type 2 diabetes mellitus with other specified complication: Secondary | ICD-10-CM | POA: Diagnosis not present

## 2021-02-13 DIAGNOSIS — Z9189 Other specified personal risk factors, not elsewhere classified: Secondary | ICD-10-CM | POA: Diagnosis not present

## 2021-02-13 DIAGNOSIS — K5900 Constipation, unspecified: Secondary | ICD-10-CM

## 2021-02-13 DIAGNOSIS — Z6841 Body Mass Index (BMI) 40.0 and over, adult: Secondary | ICD-10-CM

## 2021-02-13 DIAGNOSIS — E559 Vitamin D deficiency, unspecified: Secondary | ICD-10-CM

## 2021-02-13 MED ORDER — POLYETHYLENE GLYCOL 3350 17 GM/SCOOP PO POWD: 17.0000 g | Freq: Every day | ORAL | 0 refills | Status: DC

## 2021-02-13 MED ORDER — VITAMIN D (ERGOCALCIFEROL) 1.25 MG (50000 UNIT) PO CAPS: 50000.0000 [IU] | ORAL_CAPSULE | ORAL | 0 refills | Status: DC

## 2021-02-19 ENCOUNTER — Telehealth: Payer: Self-pay | Admitting: Family Medicine

## 2021-02-19 DIAGNOSIS — E119 Type 2 diabetes mellitus without complications: Secondary | ICD-10-CM

## 2021-02-19 DIAGNOSIS — E1169 Type 2 diabetes mellitus with other specified complication: Secondary | ICD-10-CM

## 2021-02-19 DIAGNOSIS — I10 Essential (primary) hypertension: Secondary | ICD-10-CM

## 2021-02-19 NOTE — Telephone Encounter (Signed)
-----   Message from Cloyd Stagers, RT sent at 02/06/2021 11:23 AM EDT ----- Regarding: Lab Orders for Monday 6.20.2022 Please place lab orders for Monday 6.20.2022, office visit for 6 month f/u on Monday 6.27.2022 Thank you, Dyke Maes RT(R)

## 2021-02-20 ENCOUNTER — Other Ambulatory Visit (INDEPENDENT_AMBULATORY_CARE_PROVIDER_SITE_OTHER): Payer: BC Managed Care – PPO

## 2021-02-20 ENCOUNTER — Other Ambulatory Visit: Payer: Self-pay

## 2021-02-20 DIAGNOSIS — E785 Hyperlipidemia, unspecified: Secondary | ICD-10-CM | POA: Diagnosis not present

## 2021-02-20 DIAGNOSIS — E1169 Type 2 diabetes mellitus with other specified complication: Secondary | ICD-10-CM

## 2021-02-20 DIAGNOSIS — I10 Essential (primary) hypertension: Secondary | ICD-10-CM

## 2021-02-20 DIAGNOSIS — E119 Type 2 diabetes mellitus without complications: Secondary | ICD-10-CM | POA: Diagnosis not present

## 2021-02-20 LAB — COMPREHENSIVE METABOLIC PANEL
ALT: 21 U/L (ref 0–53)
AST: 20 U/L (ref 0–37)
Albumin: 4.2 g/dL (ref 3.5–5.2)
Alkaline Phosphatase: 54 U/L (ref 39–117)
BUN: 15 mg/dL (ref 6–23)
CO2: 24 mEq/L (ref 19–32)
Calcium: 9 mg/dL (ref 8.4–10.5)
Chloride: 107 mEq/L (ref 96–112)
Creatinine, Ser: 0.94 mg/dL (ref 0.40–1.50)
GFR: 92.29 mL/min (ref >60.00)
Glucose, Bld: 113 mg/dL — ABNORMAL HIGH (ref 70–99)
Potassium: 3.8 mEq/L (ref 3.5–5.1)
Sodium: 139 mEq/L (ref 135–145)
Total Bilirubin: 0.2 mg/dL (ref 0.2–1.2)
Total Protein: 7.4 g/dL (ref 6.0–8.3)

## 2021-02-20 LAB — LIPID PANEL
Cholesterol: 135 mg/dL (ref 0–200)
HDL: 45.2 mg/dL (ref >39.00)
LDL Cholesterol: 70 mg/dL (ref 0–99)
NonHDL: 89.5
Total CHOL/HDL Ratio: 3
Triglycerides: 98 mg/dL (ref 0.0–149.0)
VLDL: 19.6 mg/dL (ref 0.0–40.0)

## 2021-02-20 LAB — HEMOGLOBIN A1C: Hgb A1c MFr Bld: 6.3 % (ref 4.6–6.5)

## 2021-02-20 NOTE — Progress Notes (Signed)
Chief Complaint:   OBESITY Charles Watkins is here to discuss his progress with his obesity treatment plan along with follow-up of his obesity related diagnoses. Charles Watkins is on the Category 4 Plan and states he is following his eating plan approximately 75-80% of the time. Charles Watkins states he is walking for 30-40 minutes 3 times per week.  Today's visit was #: 6 Starting weight: 328 lbs Starting date: 3/31/022 Today's weight: 313 lbs Today's date: 02/13/2021 Total lbs lost to date: 15 Total lbs lost since last in-office visit: 2  Interim History: Charles Watkins continues to do well with weight loss on his plan. He is meal planning well, and he had some celebrations eating since his last visit but he did well with portion control. His hunger is controlled.  Subjective:   1. Type 2 diabetes mellitus with other specified complication, without long-term current use of insulin (HCC) Charles Watkins is on metformin, and he denies signs of hypoglycemia. He is doing very well on his eating plan.  2. Vitamin D deficiency Charles Watkins is on Vit D, and he is due for a refill.  3. Constipation, unspecified constipation type Charles Watkins has a new diagnosis of constipation. He notes his BM are less frequent (every 3-4 days), and often becoming painful. He is working on hydration.  4. At risk for dehydration Charles Watkins is at risk for dehydration due to working outdoors and 90-100 degree temperatures this week.  Assessment/Plan:   1. Type 2 diabetes mellitus with other specified complication, without long-term current use of insulin (Charles Watkins) Charles Watkins will continue with diet and exercise, and he is to make sure to not skip meals. Good blood sugar control is important to decrease the likelihood of diabetic complications such as nephropathy, neuropathy, limb loss, blindness, coronary artery disease, and death. Intensive lifestyle modification including diet, exercise and weight loss are the first line of treatment for diabetes.   2. Vitamin D  deficiency Low Vitamin D level contributes to fatigue and are associated with obesity, breast, and colon cancer. We will refill prescription Vitamin D for 1 month. Charles Watkins will follow-up for routine testing of Vitamin D, at least 2-3 times per year to avoid over-replacement.  - Vitamin D, Ergocalciferol, (DRISDOL) 1.25 MG (50000 UNIT) CAPS capsule; Take 1 capsule (50,000 Units total) by mouth every 7 (seven) days.  Dispense: 4 capsule; Refill: 0  3. Constipation, unspecified constipation type Charles Watkins agreed to start miralax 17 grams per day, and he is to increase his water intake. He was informed that a decrease in bowel movement frequency is normal while losing weight, but stools should not be hard or painful. Orders and follow up as documented in patient record.   - polyethylene glycol powder (GLYCOLAX/MIRALAX) 17 GM/SCOOP powder; Take 17 g by mouth daily.  Dispense: 3350 g; Refill: 0  4. At risk for dehydration Charles Watkins was given approximately 15 minutes dehydration prevention counseling today. Charles Watkins is at risk for dehydration due to weight loss and current medication(s). He was encouraged to increase his water intake and add Power-aid Zero each day, and monitor fluid status to avoid dehydration as well as weight loss plateaus.   5. Obesity with current BMI 40.3 Charles Watkins is currently in the action stage of change. As such, his goal is to continue with weight loss efforts. He has agreed to the Category 4 Plan.   Exercise goals: As is.  Behavioral modification strategies: increasing lean protein intake and increasing water intake.  Charles Watkins has agreed to follow-up with our clinic in  2 to 3 weeks. He was informed of the importance of frequent follow-up visits to maximize his success with intensive lifestyle modifications for his multiple health conditions.   Objective:   Blood pressure 122/71, pulse 78, temperature 97.8 F (36.6 C), height 6\' 2"  (1.88 m), weight (!) 313 lb (142 kg), SpO2 97 %. Body mass  index is 40.19 kg/m.  General: Cooperative, alert, well developed, in no acute distress. HEENT: Conjunctivae and lids unremarkable. Cardiovascular: Regular rhythm.  Lungs: Normal work of breathing. Neurologic: No focal deficits.   Lab Results  Component Value Date   CREATININE 1.11 12/01/2020   BUN 13 12/01/2020   NA 138 12/01/2020   K 4.3 12/01/2020   CL 102 12/01/2020   CO2 21 12/01/2020   Lab Results  Component Value Date   ALT 23 12/01/2020   AST 23 12/01/2020   ALKPHOS 60 12/01/2020   BILITOT 0.3 12/01/2020   Lab Results  Component Value Date   HGBA1C 6.6 (H) 12/01/2020   HGBA1C 6.4 08/22/2020   HGBA1C 5.9 (A) 02/19/2020   HGBA1C 6.1 11/18/2019   HGBA1C 6.0 08/11/2019   Lab Results  Component Value Date   INSULIN 9.4 12/01/2020   Lab Results  Component Value Date   TSH 2.510 12/01/2020   Lab Results  Component Value Date   CHOL 139 12/01/2020   HDL 40 12/01/2020   LDLCALC 74 12/01/2020   LDLDIRECT 117.0 08/10/2016   TRIG 143 12/01/2020   CHOLHDL 3 08/22/2020   Lab Results  Component Value Date   WBC 4.6 12/01/2020   HGB 13.9 12/01/2020   HCT 42.5 12/01/2020   MCV 89 12/01/2020   PLT 313 12/01/2020   No results found for: IRON, TIBC, FERRITIN  Attestation Statements:   Reviewed by clinician on day of visit: allergies, medications, problem list, medical history, surgical history, family history, social history, and previous encounter notes.   I, Trixie Dredge, am acting as transcriptionist for Dennard Nip, MD.  I have reviewed the above documentation for accuracy and completeness, and I agree with the above. -  Dennard Nip, MD

## 2021-02-27 ENCOUNTER — Other Ambulatory Visit: Payer: Self-pay

## 2021-02-27 ENCOUNTER — Ambulatory Visit: Payer: BC Managed Care – PPO | Admitting: Family Medicine

## 2021-02-27 ENCOUNTER — Encounter: Payer: Self-pay | Admitting: Family Medicine

## 2021-02-27 VITALS — BP 138/82 | HR 86 | Temp 96.9°F | Ht 74.0 in | Wt 316.2 lb

## 2021-02-27 DIAGNOSIS — E1169 Type 2 diabetes mellitus with other specified complication: Secondary | ICD-10-CM

## 2021-02-27 DIAGNOSIS — E119 Type 2 diabetes mellitus without complications: Secondary | ICD-10-CM

## 2021-02-27 DIAGNOSIS — E785 Hyperlipidemia, unspecified: Secondary | ICD-10-CM

## 2021-02-27 DIAGNOSIS — I10 Essential (primary) hypertension: Secondary | ICD-10-CM | POA: Diagnosis not present

## 2021-02-27 DIAGNOSIS — M545 Low back pain, unspecified: Secondary | ICD-10-CM | POA: Insufficient documentation

## 2021-02-27 DIAGNOSIS — E559 Vitamin D deficiency, unspecified: Secondary | ICD-10-CM

## 2021-02-27 DIAGNOSIS — G8929 Other chronic pain: Secondary | ICD-10-CM

## 2021-02-27 NOTE — Assessment & Plan Note (Signed)
Improved further  Lab Results  Component Value Date   HGBA1C 6.3 02/20/2021   Commended on this and wt loss  Going to [facility] clinic Will continue metformin 500 mg bid  Arb and statin  Nl foot exam Eye exam utd  F/u 53mo

## 2021-02-27 NOTE — Assessment & Plan Note (Signed)
Pt mentions L low back stiffness with lateral bend mostly in am Given handout with exerc/stretch for low back  F/u if not helpful

## 2021-02-27 NOTE — Assessment & Plan Note (Signed)
Disc goals for lipids and reasons to control them Rev last labs with pt Rev low sat fat diet in detail Improved LDL at 70 (goal)  Continue atorvastatin 10 mg daily and lovibra 134 mg daily

## 2021-02-27 NOTE — Assessment & Plan Note (Signed)
Discussed how this problem influences overall health and the risks it imposes  Reviewed plan for weight loss with lower calorie diet (via better food choices and also portion control or program like weight watchers) and exercise building up to or more than 30 minutes 5 days per week including some aerobic activity   Enc him to continue going to the healthy wt clinic

## 2021-02-27 NOTE — Assessment & Plan Note (Signed)
Level of 21.2 in march tx by the healthy [facility]  Taking weekly high dose tx

## 2021-02-27 NOTE — Patient Instructions (Signed)
Labs look good  Keep up the good work with diet  Add more exercise when you can  Try the back program I printed  Let us know if not improved   Follow up in 6 months

## 2021-02-27 NOTE — Assessment & Plan Note (Signed)
bp in fair control at this time  BP Readings from Last 1 Encounters:  02/27/21 138/82   No changes needed Most recent labs reviewed  Disc lifstyle change with low sodium diet and exercise  Plan to continue amlodipine 10 mg daily and losartan 50 mg daily  Enc to keep working on wt loss

## 2021-02-27 NOTE — Progress Notes (Signed)
Subjective:    Patient ID: Charles Watkins, male    DOB: 1966-08-11, 55 y.o.   MRN: [id number]  This visit occurred during the SARS-CoV-2 public health emergency.  Safety protocols were in place, including screening questions prior to the visit, additional usage of staff PPE, and extensive cleaning of exam room while observing appropriate contact time as indicated for disinfecting solutions.   HPI Pt presents for 6 mo f/u of chronic health problems including DM 2  Wt Readings from Last 3 Encounters:  02/27/21 (!) 316 lb 3 oz (143.4 kg)  02/13/21 (!) 313 lb (142 kg)  01/26/21 (!) 315 lb (142.9 kg)   40.60 kg/m  [facility] and has lost 15 lb so far Is helpful   A little exercise- walking 3 d per week   They dx him with vit D def  Level of 21.2 in march Taking high dose weekly tx  HTN  bp is stable today  No cp or palpitations or headaches or edema  No side effects to medicines  BP Readings from Last 3 Encounters:  02/27/21 138/82  02/13/21 122/71  01/26/21 127/75      Amlodipine 10 mg daily  Losartan 50 mg daily   Lab Results  Component Value Date   CREATININE 0.94 02/20/2021   BUN 15 02/20/2021   NA 139 02/20/2021   K 3.8 02/20/2021   CL 107 02/20/2021   CO2 24 02/20/2021   Lab Results  Component Value Date   ALT 21 02/20/2021   AST 20 02/20/2021   ALKPHOS 54 02/20/2021   BILITOT 0.2 02/20/2021      DM2 Lab Results  Component Value Date   HGBA1C 6.3 02/20/2021   This is down from 6.4 Metformin 500 mg bid  No lows  On arb and statin   Eye exam 9/21  Hyperlipidemia Lab Results  Component Value Date   CHOL 135 02/20/2021   CHOL 139 12/01/2020   CHOL 141 08/22/2020   Lab Results  Component Value Date   HDL 45.20 02/20/2021   HDL 40 12/01/2020   HDL 46.20 08/22/2020   Lab Results  Component Value Date   LDLCALC 70 02/20/2021   LDLCALC 74 12/01/2020   LDLCALC 67 08/22/2020   Lab Results   Component Value Date   TRIG 98.0 02/20/2021   TRIG 143 12/01/2020   TRIG 139.0 08/22/2020   Lab Results  Component Value Date   CHOLHDL 3 02/20/2021   CHOLHDL 3 08/22/2020   CHOLHDL 3 11/18/2019   Lab Results  Component Value Date   LDLDIRECT 117.0 08/10/2016   LDLDIRECT 130.7 09/28/2013   LDLDIRECT 136.5 03/12/2012   Taking atorvastatin 10 mg daily  Lofibra 134 mg daily   Feels great Definitely feels better!   Patient Active Problem List   Diagnosis Date Noted   Vitamin D deficiency 02/27/2021   Colon cancer screening 08/18/2018   Left knee pain 02/10/2018   Controlled type 2 diabetes mellitus without complication, without long-term current use of insulin ([place]) 11/04/2017   H/O motion sickness 11/04/2017   Screening for HIV (human immunodeficiency virus) 08/09/2015   Prostate cancer screening 08/03/2015   Routine general medical examination at a health care facility 03/22/2013   Morbid obesity ([place]) 09/23/2012   Essential hypertension 08/14/2007   Allergic rhinitis 08/14/2007   BAKER'S CYST 08/14/2007   SLEEP APNEA 08/14/2007   Hyperlipidemia associated with type 2 diabetes mellitus ([place]) 01/16/2007  ADVEF, DRUG/MEDICINAL/BIOLOGICAL SUBST NOS 01/16/2007   Past Medical History:  Diagnosis Date   Allergic rhinitis    Allergy    Arthritis    knee   Diabetes mellitus without complication (HCC)    GERD (gastroesophageal reflux disease)    Hyperglycemia    Hyperlipidemia    Hypertension    Other fatigue    Shortness of breath on exertion    Past Surgical History:  Procedure Laterality Date   Baker's cyst - aspirated  03/2005   KNEE ARTHROSCOPY     WISDOM TOOTH EXTRACTION     Social History   Tobacco Use   Smoking status: Former    Packs/day: 1.00    Years: 24.00    Pack years: 24.00    Types: Cigarettes    Quit date: 08/06/2011    Years since quitting: 9.5   Smokeless tobacco: Current    Types: Chew   Tobacco comments:    non smoker  Vaping Use    Vaping Use: Never used  Substance Use Topics   Alcohol use: Yes    Alcohol/week: 0.0 standard drinks    Comment: drinks on weekend   Drug use: No   Family History  Problem Relation Age of Onset   Heart disease Mother    Thyroid disease Mother    Hypertension Mother    Brain cancer Mother    Stroke Mother    Cancer Mother    Alcoholism Father    Colon cancer Neg Hx    Colon polyps Neg Hx    Esophageal cancer Neg Hx    Rectal cancer Neg Hx    Stomach cancer Neg Hx    Allergies  Allergen Reactions   Lisinopril Cough   Current Outpatient Medications on File Prior to Visit  Medication Sig Dispense Refill   amLODipine (NORVASC) 10 MG tablet TAKE 1 TABLET DAILY 90 tablet 3   aspirin 81 MG tablet Take 81 mg by mouth daily.     atorvastatin (LIPITOR) 10 MG tablet TAKE 1 TABLET DAILY IN THE EVENING 90 tablet 3   cetirizine (ZYRTEC) 10 MG tablet Take 10 mg by mouth every other day.     esomeprazole (NEXIUM) 20 MG capsule Take 1 capsule (20 mg total) by mouth as needed. 90 capsule 3   fenofibrate micronized (LOFIBRA) 134 MG capsule Take 1 capsule (134 mg total) by mouth daily. 90 capsule 3   losartan (COZAAR) 50 MG tablet TAKE 1 TABLET DAILY 90 tablet 3   metFORMIN (GLUCOPHAGE) 500 MG tablet TAKE 1 TABLET TWICE A DAY WITH MEALS 180 tablet 1   Multiple Vitamin (MULTIVITAMIN) tablet Take 1 tablet by mouth daily.     polyethylene glycol powder (GLYCOLAX/MIRALAX) 17 GM/SCOOP powder Take 17 g by mouth daily. 3350 g 0   Vitamin D, Ergocalciferol, (DRISDOL) 1.25 MG (50000 UNIT) CAPS capsule Take 1 capsule (50,000 Units total) by mouth every 7 (seven) days. 4 capsule 0   No current facility-administered medications on file prior to visit.     Review of Systems  Constitutional:  Negative for activity change, appetite change, fatigue, fever and unexpected weight change.  HENT:  Negative for congestion, rhinorrhea, sore throat and trouble swallowing.   Eyes:  Negative for pain, redness,  itching and visual disturbance.  Respiratory:  Negative for cough, chest tightness, shortness of breath and wheezing.   Cardiovascular:  Negative for chest pain and palpitations.  Gastrointestinal:  Negative for abdominal pain, blood in stool, constipation, diarrhea and nausea.  Endocrine: Negative for cold intolerance, heat intolerance, polydipsia and polyuria.  Genitourinary:  Negative for difficulty urinating, dysuria, frequency and urgency.  Musculoskeletal:  Negative for arthralgias, joint swelling and myalgias.       Occ low back hurts on the L side (with lateral bend)  He tries to extend to stretch in am   Skin:  Negative for pallor and rash.  Neurological:  Negative for dizziness, tremors, weakness, numbness and headaches.  Hematological:  Negative for adenopathy. Does not bruise/bleed easily.  Psychiatric/Behavioral:  Negative for decreased concentration and dysphoric mood. The patient is not nervous/anxious.       Objective:   Physical Exam Constitutional:      General: He is not in acute distress.    Appearance: Normal appearance. He is well-developed. He is obese. He is not ill-appearing or diaphoretic.  HENT:     Head: Normocephalic and atraumatic.  Eyes:     Conjunctiva/sclera: Conjunctivae normal.     Pupils: Pupils are equal, round, and reactive to light.  Neck:     Thyroid: No thyromegaly.     Vascular: No carotid bruit or JVD.  Cardiovascular:     Rate and Rhythm: Normal rate and regular rhythm.     Heart sounds: Normal heart sounds.    No gallop.  Pulmonary:     Effort: Pulmonary effort is normal. No respiratory distress.     Breath sounds: Normal breath sounds. No wheezing or rales.  Abdominal:     General: Bowel sounds are normal. There is no distension or abdominal bruit.     Palpations: Abdomen is soft. There is no mass.     Tenderness: There is no abdominal tenderness.  Musculoskeletal:     Cervical back: Normal range of motion and neck supple.      Right lower leg: No edema.     Left lower leg: No edema.     Comments: L low back-points to area that hurts and gets stiff Not bothering him today  Lymphadenopathy:     Cervical: No cervical adenopathy.  Skin:    General: Skin is warm and dry.     Coloration: Skin is not pale.     Findings: No rash.  Neurological:     Mental Status: He is alert.     Sensory: No sensory deficit.     Coordination: Coordination normal.     Deep Tendon Reflexes: Reflexes are normal and symmetric. Reflexes normal.  Psychiatric:        Mood and Affect: Mood normal.        Cognition and Memory: Cognition normal.          Assessment & Plan:   Problem List Items Addressed This Visit       Cardiovascular and Mediastinum   Essential hypertension    bp in fair control at this time  BP Readings from Last 1 Encounters:  02/27/21 138/82  No changes needed Most recent labs reviewed  Disc lifstyle change with low sodium diet and exercise  Plan to continue amlodipine 10 mg daily and losartan 50 mg daily  Enc to keep working on wt loss         Endocrine   Hyperlipidemia associated with type 2 diabetes mellitus ([place])    Disc goals for lipids and reasons to control them Rev last labs with pt Rev low sat fat diet in detail Improved LDL at 70 (goal)  Continue atorvastatin 10 mg daily and lovibra 134 mg daily  Controlled type 2 diabetes mellitus without complication, without long-term current use of insulin (HCC) - Primary    Improved further  Lab Results  Component Value Date   HGBA1C 6.3 02/20/2021   Commended on this and wt loss  Going to healthy wt clinic Will continue metformin 500 mg bid  Arb and statin  Nl foot exam Eye exam utd  F/u 46mo         Other   Morbid obesity ([place])    Discussed how this problem influences overall health and the risks it imposes  Reviewed plan for weight loss with lower calorie diet (via better food choices and also portion control or program like  weight watchers) and exercise building up to or more than 30 minutes 5 days per week including some aerobic activity   Enc him to continue going to the healthy wt clinic       Vitamin D deficiency    Level of 21.2 in march tx by the healthy [facility]  Taking weekly high dose tx        Left low back pain    Pt mentions L low back stiffness with lateral bend mostly in am Given handout with exerc/stretch for low back  F/u if not helpful

## 2021-03-02 ENCOUNTER — Encounter (INDEPENDENT_AMBULATORY_CARE_PROVIDER_SITE_OTHER): Payer: Self-pay | Admitting: Physician Assistant

## 2021-03-02 ENCOUNTER — Other Ambulatory Visit: Payer: Self-pay

## 2021-03-02 ENCOUNTER — Ambulatory Visit (INDEPENDENT_AMBULATORY_CARE_PROVIDER_SITE_OTHER): Payer: BC Managed Care – PPO | Admitting: Physician Assistant

## 2021-03-02 VITALS — BP 139/78 | HR 67 | Temp 98.2°F | Ht 74.0 in | Wt 314.0 lb

## 2021-03-02 DIAGNOSIS — Z6841 Body Mass Index (BMI) 40.0 and over, adult: Secondary | ICD-10-CM | POA: Diagnosis not present

## 2021-03-02 DIAGNOSIS — E785 Hyperlipidemia, unspecified: Secondary | ICD-10-CM | POA: Diagnosis not present

## 2021-03-02 DIAGNOSIS — E559 Vitamin D deficiency, unspecified: Secondary | ICD-10-CM

## 2021-03-02 DIAGNOSIS — Z9189 Other specified personal risk factors, not elsewhere classified: Secondary | ICD-10-CM | POA: Diagnosis not present

## 2021-03-02 DIAGNOSIS — E1169 Type 2 diabetes mellitus with other specified complication: Secondary | ICD-10-CM

## 2021-03-02 MED ORDER — VITAMIN D (ERGOCALCIFEROL) 1.25 MG (50000 UNIT) PO CAPS: 50000.0000 [IU] | ORAL_CAPSULE | ORAL | 0 refills | Status: DC

## 2021-03-09 NOTE — Progress Notes (Signed)
Chief Complaint:   OBESITY Charles Watkins is here to discuss his progress with his obesity treatment plan along with follow-up of his obesity related diagnoses. Charles Watkins is on the Category 4 Plan and states he is following his eating plan approximately 70% of the time. Charles Watkins states he is walking for 30-45 minutes 3 times per week.  Today's visit was #: 7 Starting weight: 328 lbs Starting date: 12/01/2020 Today's weight: 314 lbs Today's date: 03/02/2021 Total lbs lost to date: 14 Total lbs lost since last in-office visit: 0  Interim History: Charles Watkins attended 2 graduation parties and a birthday party, and he did not fully eat on the plan. He is skipping breakfast some days. On the days he does eat, he eats peanut butter toast and has a glass of milk and that's all. He is not eating enough throughout the day.  Subjective:   1. Vitamin D deficiency Noah is on Vit D, and he is tolerating it well.  2. Hyperlipidemia associated with type 2 diabetes mellitus ([place]) Saurabh's last lipid panel was at goal. He is on Lipitor, and he denies chest pain.  3. At risk for heart disease Axel is at a higher than average risk for cardiovascular disease due to obesity.   Assessment/Plan:   1. Vitamin D deficiency Low Vitamin D level contributes to fatigue and are associated with obesity, breast, and colon cancer. We will refill prescription Vitamin D for 1 month. Bricen will follow-up for routine testing of Vitamin D, at least 2-3 times per year to avoid over-replacement.  - Vitamin D, Ergocalciferol, (DRISDOL) 1.25 MG (50000 UNIT) CAPS capsule; Take 1 capsule (50,000 Units total) by mouth every 7 (seven) days.  Dispense: 4 capsule; Refill: 0  2. Hyperlipidemia associated with type 2 diabetes mellitus ([place]) Cardiovascular risk and specific lipid/LDL goals reviewed. We discussed several lifestyle modifications today. Charles Watkins will continue his medications and meal plan, and will continue to work on diet, exercise and  weight loss efforts. Orders and follow up as documented in patient record.   Counseling Intensive lifestyle modifications are the first line treatment for this issue. Dietary changes: Increase soluble fiber. Decrease simple carbohydrates. Exercise changes: Moderate to vigorous-intensity aerobic activity 150 minutes per week if tolerated. Lipid-lowering medications: see documented in medical record.  3. At risk for heart disease Charles Watkins was given approximately 15 minutes of coronary artery disease prevention counseling today. He is 55 y.o. male and has risk factors for heart disease including obesity. We discussed intensive lifestyle modifications today with an emphasis on specific weight loss instructions and strategies.   Repetitive spaced learning was employed today to elicit superior memory formation and behavioral change.  4. BMI 40.0-44.9, adult [facility]) Charles Watkins is currently in the action stage of change. As such, his goal is to continue with weight loss efforts. He has agreed to the Category 4 Plan.   Exercise goals: As is.  Behavioral modification strategies: no skipping meals, meal planning and cooking strategies, and keeping healthy foods in the home.  Charles Watkins has agreed to follow-up with our clinic in 2 weeks. He was informed of the importance of frequent follow-up visits to maximize his success with intensive lifestyle modifications for his multiple health conditions.   Objective:   Blood pressure 139/78, pulse 67, temperature 98.2 F (36.8 C), height 6\' 2"  (1.88 m), weight (!) 314 lb (142.4 kg), SpO2 97 %. Body mass index is 40.32 kg/m.  General: Cooperative, alert, well developed, in no acute distress. HEENT: Conjunctivae and  lids unremarkable. Cardiovascular: Regular rhythm.  Lungs: Normal work of breathing. Neurologic: No focal deficits.   Lab Results  Component Value Date   CREATININE 0.94 02/20/2021   BUN 15 02/20/2021   NA 139 02/20/2021   K 3.8 02/20/2021   CL 107  02/20/2021   CO2 24 02/20/2021   Lab Results  Component Value Date   ALT 21 02/20/2021   AST 20 02/20/2021   ALKPHOS 54 02/20/2021   BILITOT 0.2 02/20/2021   Lab Results  Component Value Date   HGBA1C 6.3 02/20/2021   HGBA1C 6.6 (H) 12/01/2020   HGBA1C 6.4 08/22/2020   HGBA1C 5.9 (A) 02/19/2020   HGBA1C 6.1 11/18/2019   Lab Results  Component Value Date   INSULIN 9.4 12/01/2020   Lab Results  Component Value Date   TSH 2.510 12/01/2020   Lab Results  Component Value Date   CHOL 135 02/20/2021   HDL 45.20 02/20/2021   LDLCALC 70 02/20/2021   LDLDIRECT 117.0 08/10/2016   TRIG 98.0 02/20/2021   CHOLHDL 3 02/20/2021   Lab Results  Component Value Date   VD25OH 21.2 (L) 12/01/2020   Lab Results  Component Value Date   WBC 4.6 12/01/2020   HGB 13.9 12/01/2020   HCT 42.5 12/01/2020   MCV 89 12/01/2020   PLT 313 12/01/2020   No results found for: IRON, TIBC, FERRITIN  Attestation Statements:   Reviewed by clinician on day of visit: allergies, medications, problem list, medical history, surgical history, family history, social history, and previous encounter notes.   Wilhemena Durie, am acting as transcriptionist for [employer], PA-C.  I have reviewed the above documentation for accuracy and completeness, and I agree with the above. Abby Potash, PA-C

## 2021-03-10 ENCOUNTER — Other Ambulatory Visit: Payer: Self-pay | Admitting: Family Medicine

## 2021-03-16 ENCOUNTER — Encounter (INDEPENDENT_AMBULATORY_CARE_PROVIDER_SITE_OTHER): Payer: Self-pay | Admitting: Family Medicine

## 2021-03-16 ENCOUNTER — Other Ambulatory Visit: Payer: Self-pay

## 2021-03-16 ENCOUNTER — Ambulatory Visit (INDEPENDENT_AMBULATORY_CARE_PROVIDER_SITE_OTHER): Payer: BC Managed Care – PPO | Admitting: Family Medicine

## 2021-03-16 VITALS — BP 142/84 | HR 69 | Temp 97.8°F | Ht 74.0 in | Wt 315.0 lb

## 2021-03-16 DIAGNOSIS — Z6841 Body Mass Index (BMI) 40.0 and over, adult: Secondary | ICD-10-CM | POA: Diagnosis not present

## 2021-03-16 DIAGNOSIS — Z9189 Other specified personal risk factors, not elsewhere classified: Secondary | ICD-10-CM | POA: Diagnosis not present

## 2021-03-16 DIAGNOSIS — I10 Essential (primary) hypertension: Secondary | ICD-10-CM

## 2021-03-16 DIAGNOSIS — E559 Vitamin D deficiency, unspecified: Secondary | ICD-10-CM

## 2021-03-16 MED ORDER — AMLODIPINE BESYLATE 10 MG PO TABS: 10.0000 mg | ORAL_TABLET | Freq: Every day | ORAL | 0 refills | Status: DC

## 2021-03-17 ENCOUNTER — Other Ambulatory Visit (INDEPENDENT_AMBULATORY_CARE_PROVIDER_SITE_OTHER): Payer: Self-pay | Admitting: Family Medicine

## 2021-03-17 DIAGNOSIS — E559 Vitamin D deficiency, unspecified: Secondary | ICD-10-CM

## 2021-03-17 LAB — CMP14+EGFR
ALT: 20 IU/L (ref 0–44)
AST: 17 IU/L (ref 0–40)
Albumin/Globulin Ratio: 1.5 (ref 1.2–2.2)
Albumin: 4.5 g/dL (ref 3.8–4.9)
Alkaline Phosphatase: 57 IU/L (ref 44–121)
BUN/Creatinine Ratio: 14 (ref 9–20)
BUN: 15 mg/dL (ref 6–24)
Bilirubin Total: <0.2 mg/dL (ref 0.0–1.2)
CO2: 23 mmol/L (ref 20–29)
Calcium: 9.1 mg/dL (ref 8.7–10.2)
Chloride: 104 mmol/L (ref 96–106)
Creatinine, Ser: 1.05 mg/dL (ref 0.76–1.27)
Globulin, Total: 3.1 g/dL (ref 1.5–4.5)
Glucose: 106 mg/dL — ABNORMAL HIGH (ref 65–99)
Potassium: 4.4 mmol/L (ref 3.5–5.2)
Sodium: 141 mmol/L (ref 134–144)
Total Protein: 7.6 g/dL (ref 6.0–8.5)
eGFR: 85 mL/min/{1.73_m2} (ref >59)

## 2021-03-17 LAB — VITAMIN D 25 HYDROXY (VIT D DEFICIENCY, FRACTURES): Vit D, 25-Hydroxy: 54.1 ng/mL (ref 30.0–100.0)

## 2021-03-20 NOTE — Telephone Encounter (Signed)
LAST APPOINTMENT DATE: 03/16/2021  NEXT APPOINTMENT DATE: 04/04/2021   [facility]  [facility] - [place], [address] [place] [address] Phone: [phone] Fax: [phone]  [id number] - [place], [place] - [address] [place] [address] Phone: [phone] Fax: [phone]  Patient is requesting a refill of the following medications: Requested Prescriptions   Pending Prescriptions Disp Refills   Vitamin D, Ergocalciferol, (DRISDOL) 1.25 MG (50000 UNIT) CAPS capsule [Pharmacy Med Name: VITAMIN D2 50,000IU (ERGO) CAP RX] 4 capsule 0    Sig: TAKE 1 CAPSULE BY MOUTH EVERY 7 DAYS    Date last filled: 03/02/21 Previously prescribed by [facility]  Lab Results  Component Value Date   HGBA1C 6.3 02/20/2021   HGBA1C 6.6 (H) 12/01/2020   HGBA1C 6.4 08/22/2020   Lab Results  Component Value Date   MICROALBUR 2.5 (H) 08/29/2020   LDLCALC 70 02/20/2021   CREATININE 1.05 03/16/2021   Lab Results  Component Value Date   VD25OH 54.1 03/16/2021   VD25OH 21.2 (L) 12/01/2020    BP Readings from Last 3 Encounters:  03/16/21 (!) 142/84  03/02/21 139/78  02/27/21 138/82

## 2021-03-20 NOTE — Telephone Encounter (Signed)
Pt last seen by Dr. Beasley.  

## 2021-03-27 NOTE — Progress Notes (Signed)
Chief Complaint:   OBESITY Charles Watkins is here to discuss his progress with his obesity treatment plan along with follow-up of his obesity related diagnoses. Charles Watkins is on the Category 4 Plan and states he is following his eating plan approximately 80% of the time. Charles Watkins states he is walking for 30 minutes 3 times per week.  Today's visit was #: 8 Starting weight: 328 lbs Starting date: 12/01/2020 Today's weight: 315 lbs Today's date: 03/16/2021 Total lbs lost to date: 13 Total lbs lost since last in-office visit: 0  Interim History: Charles Watkins has been struggling  to stay on trak with his Category 4 plan. He has done well overall with his weight loss but he is deviating more from his plan. He is open to looking at other eating plan options.  Subjective:   1. Vitamin D deficiency Charles Watkins is stable on Vit D, and he denies signs of over-replacement. Last Vit D level was not yet at goal.  2. Essential hypertension Charles Watkins's blood pressure is elevated today. He hasn't taken his medications yet this morning.  3. At risk for heart disease Charles Watkins is at a higher than average risk for cardiovascular disease due to obesity.   Assessment/Plan:   1. Vitamin D deficiency Low Vitamin D level contributes to fatigue and are associated with obesity, breast, and colon cancer. We will check labs today. Charles Watkins will follow-up for routine testing of Vitamin D, at least 2-3 times per year to avoid over-replacement.  - VITAMIN D 25 Hydroxy (Vit-D Deficiency, Fractures)  2. Essential hypertension Charles Watkins was encouraged to take his blood pressure pills before his visit so we can get a more accurate picture of his blood pressure control. We will check labs today, and we will refill amlodipine for 1 month. He will continue working on healthy weight loss and exercise to improve blood pressure control. We will watch for signs of hypotension as he continues his lifestyle modifications.  - amLODipine (NORVASC) 10 MG tablet;  Take 1 tablet (10 mg total) by mouth daily.  Dispense: 30 tablet; Refill: 0 - [id number]+EGFR  3. At risk for heart disease Charles Watkins was given approximately 15 minutes of coronary artery disease prevention counseling today. He is 55 y.o. male and has risk factors for heart disease including obesity. We discussed intensive lifestyle modifications today with an emphasis on specific weight loss instructions and strategies.   Repetitive spaced learning was employed today to elicit superior memory formation and behavioral change.  4. Obesity with current BMI 40.5 Charles Watkins is currently in the action stage of change. As such, his goal is to continue with weight loss efforts. He has agreed to change to following a lower carbohydrate, vegetable and lean protein rich diet plan.   Exercise goals: As is.  Behavioral modification strategies: increasing lean protein intake and meal planning and cooking strategies.  Charles Watkins has agreed to follow-up with our clinic in 3 weeks. He was informed of the importance of frequent follow-up visits to maximize his success with intensive lifestyle modifications for his multiple health conditions.   Charles Watkins was informed we would discuss his lab results at his next visit unless there is a critical issue that needs to be addressed sooner. Charles Watkins agreed to keep his next visit at the agreed upon time to discuss these results.  Objective:   Blood pressure (!) 142/84, pulse 69, temperature 97.8 F (36.6 C), height 6' 2"  (1.88 m), weight (!) 315 lb (142.9 kg), SpO2 97 %. Body mass index is 40.44  kg/m.  General: Cooperative, alert, well developed, in no acute distress. HEENT: Conjunctivae and lids unremarkable. Cardiovascular: Regular rhythm.  Lungs: Normal work of breathing. Neurologic: No focal deficits.   Lab Results  Component Value Date   CREATININE 1.05 03/16/2021   BUN 15 03/16/2021   NA 141 03/16/2021   K 4.4 03/16/2021   CL 104 03/16/2021   CO2 23 03/16/2021   Lab  Results  Component Value Date   ALT 20 03/16/2021   AST 17 03/16/2021   ALKPHOS 57 03/16/2021   BILITOT <0.2 03/16/2021   Lab Results  Component Value Date   HGBA1C 6.3 02/20/2021   HGBA1C 6.6 (H) 12/01/2020   HGBA1C 6.4 08/22/2020   HGBA1C 5.9 (A) 02/19/2020   HGBA1C 6.1 11/18/2019   Lab Results  Component Value Date   INSULIN 9.4 12/01/2020   Lab Results  Component Value Date   TSH 2.510 12/01/2020   Lab Results  Component Value Date   CHOL 135 02/20/2021   HDL 45.20 02/20/2021   LDLCALC 70 02/20/2021   LDLDIRECT 117.0 08/10/2016   TRIG 98.0 02/20/2021   CHOLHDL 3 02/20/2021   Lab Results  Component Value Date   VD25OH 54.1 03/16/2021   VD25OH 21.2 (L) 12/01/2020   Lab Results  Component Value Date   WBC 4.6 12/01/2020   HGB 13.9 12/01/2020   HCT 42.5 12/01/2020   MCV 89 12/01/2020   PLT 313 12/01/2020   No results found for: IRON, TIBC, FERRITIN  Attestation Statements:   Reviewed by clinician on day of visit: allergies, medications, problem list, medical history, surgical history, family history, social history, and previous encounter notes.   I, Charles Watkins, am acting as transcriptionist for Charles Nip, MD.  I have reviewed the above documentation for accuracy and completeness, and I agree with the above. -  Charles Nip, MD

## 2021-04-04 ENCOUNTER — Other Ambulatory Visit: Payer: Self-pay

## 2021-04-04 ENCOUNTER — Ambulatory Visit (INDEPENDENT_AMBULATORY_CARE_PROVIDER_SITE_OTHER): Payer: BC Managed Care – PPO | Admitting: Physician Assistant

## 2021-04-04 VITALS — BP 144/80 | HR 93 | Temp 97.8°F | Ht 74.0 in | Wt 317.0 lb

## 2021-04-04 DIAGNOSIS — Z6841 Body Mass Index (BMI) 40.0 and over, adult: Secondary | ICD-10-CM | POA: Diagnosis not present

## 2021-04-04 DIAGNOSIS — E119 Type 2 diabetes mellitus without complications: Secondary | ICD-10-CM

## 2021-04-04 DIAGNOSIS — Z9189 Other specified personal risk factors, not elsewhere classified: Secondary | ICD-10-CM | POA: Diagnosis not present

## 2021-04-04 DIAGNOSIS — E559 Vitamin D deficiency, unspecified: Secondary | ICD-10-CM

## 2021-04-05 NOTE — Progress Notes (Signed)
Chief Complaint:   OBESITY Charles Watkins is here to discuss his progress with his obesity treatment plan along with follow-up of his obesity related diagnoses. Charles Watkins is on following a lower carbohydrate, vegetable and lean protein rich diet plan and states he is following his eating plan approximately 50% of the time. Charles Watkins states he is doing 0 minutes 0 times per week.  Today's visit was #: 9 Starting weight: 328 lbs Starting date: 12/01/2020 Today's weight: 317 lbs Today's date: 04/04/2021 Total lbs lost to date: 11 lbs Total lbs lost since last in-office visit: 0  Interim History: Charles Watkins reports that he did celebration and vacation eating and did not stick with low carb. He wants to go back to category 4 as he finds it easier to eat sandwiches at lunch.  Subjective:   1. Vitamin D deficiency Baldo's last Vitamin D was at goal. Hazaiah is on weekly Vitamin D. I discussed labs with Charles Watkins today.  2. Controlled type 2 diabetes mellitus without complication, without long-term current use of insulin (Charles Watkins) Raywood's last A1C level was 6.3, improved from 6.6. Dennis is on Metformin twice daily. I discussed labs with Charles Watkins today.  3. At risk for hypoglycemia Charles Watkins is at risk for hypoglycemia due to obesity.  Assessment/Plan:   1. Vitamin D deficiency Low Vitamin D level contributes to fatigue and are associated with obesity, breast, and colon cancer. Charles Watkins agrees to start taking prescription Vitamin D 4,000 IU every week and he will follow-up for routine testing of Vitamin D, at least 2-3 times per year to avoid over-replacement.   2. Controlled type 2 diabetes mellitus without complication, without long-term current use of insulin (Charles Watkins) Charles Watkins will continue taking Metformin. Good blood sugar control is important to decrease the likelihood of diabetic complications such as nephropathy, neuropathy, limb loss, blindness, coronary artery disease, and death. Intensive lifestyle modification including  diet, exercise and weight loss are the first line of treatment for diabetes.    3. At risk for hypoglycemia Charles Watkins was given approximately 15 minutes of counseling today regarding prevention of hypoglycemia. He was advised of symptoms of hypoglycemia. Charles Watkins was instructed to avoid skipping meals, eat regular protein rich meals and schedule low calorie snacks as needed.   Repetitive spaced learning was employed today to elicit superior memory formation and behavioral change   4. Obesity with current BMI 40.68 Charles Watkins is currently in the action stage of change. As such, his goal is to continue with weight loss efforts. He has agreed to the Category 4 Plan.   Exercise goals: No exercise has been prescribed at this time.  Behavioral modification strategies: meal planning and cooking strategies and keeping healthy foods in the home.  Charles Watkins has agreed to follow-up with our clinic in 2 weeks. He was informed of the importance of frequent follow-up visits to maximize his success with intensive lifestyle modifications for his multiple health conditions.   Objective:   Blood pressure (!) 144/80, pulse 93, temperature 97.8 F (36.6 C), height '6\' 2"'$  (1.88 m), weight (!) 317 lb (143.8 kg), SpO2 98 %. Body mass index is 40.7 kg/m.  General: Cooperative, alert, well developed, in no acute distress. HEENT: Conjunctivae and lids unremarkable. Cardiovascular: Regular rhythm.  Lungs: Normal work of breathing. Neurologic: No focal deficits.   Lab Results  Component Value Date   CREATININE 1.05 03/16/2021   BUN 15 03/16/2021   NA 141 03/16/2021   K 4.4 03/16/2021   CL 104 03/16/2021   CO2 23  03/16/2021   Lab Results  Component Value Date   ALT 20 03/16/2021   AST 17 03/16/2021   ALKPHOS 57 03/16/2021   BILITOT <0.2 03/16/2021   Lab Results  Component Value Date   HGBA1C 6.3 02/20/2021   HGBA1C 6.6 (H) 12/01/2020   HGBA1C 6.4 08/22/2020   HGBA1C 5.9 (A) 02/19/2020   HGBA1C 6.1 11/18/2019    Lab Results  Component Value Date   INSULIN 9.4 12/01/2020   Lab Results  Component Value Date   TSH 2.510 12/01/2020   Lab Results  Component Value Date   CHOL 135 02/20/2021   HDL 45.20 02/20/2021   LDLCALC 70 02/20/2021   LDLDIRECT 117.0 08/10/2016   TRIG 98.0 02/20/2021   CHOLHDL 3 02/20/2021   Lab Results  Component Value Date   VD25OH 54.1 03/16/2021   VD25OH 21.2 (L) 12/01/2020   Lab Results  Component Value Date   WBC 4.6 12/01/2020   HGB 13.9 12/01/2020   HCT 42.5 12/01/2020   MCV 89 12/01/2020   PLT 313 12/01/2020   No results found for: IRON, TIBC, FERRITIN  Attestation Statements:   Reviewed by clinician on day of visit: allergies, medications, problem list, medical history, surgical history, family history, social history, and previous encounter notes.  I, Charles Watkins, am acting as Location manager for [employer], PA-C.   I have reviewed the above documentation for accuracy and completeness, and I agree with the above. Abby Potash, PA-C

## 2021-04-18 ENCOUNTER — Encounter (INDEPENDENT_AMBULATORY_CARE_PROVIDER_SITE_OTHER): Payer: Self-pay

## 2021-04-20 ENCOUNTER — Encounter (INDEPENDENT_AMBULATORY_CARE_PROVIDER_SITE_OTHER): Payer: Self-pay | Admitting: Family Medicine

## 2021-04-20 ENCOUNTER — Ambulatory Visit (INDEPENDENT_AMBULATORY_CARE_PROVIDER_SITE_OTHER): Payer: BC Managed Care – PPO | Admitting: Family Medicine

## 2021-04-20 ENCOUNTER — Other Ambulatory Visit: Payer: Self-pay

## 2021-04-20 VITALS — BP 141/75 | HR 76 | Temp 98.0°F | Ht 74.0 in | Wt 318.0 lb

## 2021-04-20 DIAGNOSIS — I10 Essential (primary) hypertension: Secondary | ICD-10-CM

## 2021-04-20 DIAGNOSIS — Z9189 Other specified personal risk factors, not elsewhere classified: Secondary | ICD-10-CM | POA: Diagnosis not present

## 2021-04-20 DIAGNOSIS — Z6841 Body Mass Index (BMI) 40.0 and over, adult: Secondary | ICD-10-CM

## 2021-04-20 DIAGNOSIS — E7849 Other hyperlipidemia: Secondary | ICD-10-CM | POA: Diagnosis not present

## 2021-04-20 MED ORDER — AMLODIPINE BESYLATE 10 MG PO TABS: 10.0000 mg | ORAL_TABLET | Freq: Every day | ORAL | 0 refills | Status: DC

## 2021-04-24 NOTE — Progress Notes (Signed)
Chief Complaint:   OBESITY Charles Watkins is here to discuss his progress with his obesity treatment plan along with follow-up of his obesity related diagnoses. Charles Watkins is on the Category 4 Plan and states he is following his eating plan approximately 85% of the time. Charles Watkins states he is doing 0 minutes 0 times per week.  Today's visit was #: 10 Starting weight: 328 lbs Starting date: 12/01/2020 Today's weight: 318 lbs Today's date: 04/20/2021 Total lbs lost to date: 10 Total lbs lost since last in-office visit: 0  Interim History: Charles Watkins continues to try to eat healthier. He is doing more meal prepping and trying to increase lean protein. He struggled with following our Low carbohydrate plan strictly and he wonders if he should go back to the Category 4 plan.  Subjective:   1. Essential hypertension Charles Watkins's blood pressure is mildly elevated today. He has been elevated the last few visits. He is working on weight loss but he has been slowly gaining instead.  2. Other hyperlipidemia Charles Watkins is stable on Lipitor, and he is working on diet. He denies chest pain or myalgias.   3. At risk for heart disease Charles Watkins is at a higher than average risk for cardiovascular disease due to obesity.   Assessment/Plan:   1. Essential hypertension We will refill Norvasc for 90 days with no refills. Charles Watkins is to contact his primary care physician to see if they would like to adjust his medications. He will continue to work on diet and exercise to help improve his blood pressure.   - amLODipine (NORVASC) 10 MG tablet; Take 1 tablet (10 mg total) by mouth daily.  Dispense: 90 tablet; Refill: 0  2. Other hyperlipidemia Cardiovascular risk and specific lipid/LDL goals reviewed. We discussed several lifestyle modifications today. Charles Watkins will continue diet, and work on decreasing cholesterol, and he will continue Lipitor. He will work on nutrition and weight loss. Orders and follow up as documented in patient record.    3. At risk for heart disease Charles Watkins was given approximately 15 minutes of coronary artery disease prevention counseling today. He is 55 y.o. male and has risk factors for heart disease including obesity. We discussed intensive lifestyle modifications today with an emphasis on specific weight loss instructions and strategies.   Repetitive spaced learning was employed today to elicit superior memory formation and behavioral change.  4. Obesity with current BMI 40.9 Charles Watkins is currently in the action stage of change. As such, his goal is to continue with weight loss efforts. He has agreed to the Category 4 Plan or following a lower carbohydrate, vegetable and lean protein rich diet plan.   Behavioral modification strategies: increasing lean protein intake.  Charles Watkins has agreed to follow-up with our clinic in 3 weeks. He was informed of the importance of frequent follow-up visits to maximize his success with intensive lifestyle modifications for his multiple health conditions.   Objective:   Blood pressure (!) 141/75, pulse 76, temperature 98 F (36.7 C), height '6\' 2"'$  (1.88 m), weight (!) 318 lb (144.2 kg), SpO2 98 %. Body mass index is 40.83 kg/m.  General: Cooperative, alert, well developed, in no acute distress. HEENT: Conjunctivae and lids unremarkable. Cardiovascular: Regular rhythm.  Lungs: Normal work of breathing. Neurologic: No focal deficits.   Lab Results  Component Value Date   CREATININE 1.05 03/16/2021   BUN 15 03/16/2021   NA 141 03/16/2021   K 4.4 03/16/2021   CL 104 03/16/2021   CO2 23 03/16/2021  Lab Results  Component Value Date   ALT 20 03/16/2021   AST 17 03/16/2021   ALKPHOS 57 03/16/2021   BILITOT <0.2 03/16/2021   Lab Results  Component Value Date   HGBA1C 6.3 02/20/2021   HGBA1C 6.6 (H) 12/01/2020   HGBA1C 6.4 08/22/2020   HGBA1C 5.9 (A) 02/19/2020   HGBA1C 6.1 11/18/2019   Lab Results  Component Value Date   INSULIN 9.4 12/01/2020   Lab  Results  Component Value Date   TSH 2.510 12/01/2020   Lab Results  Component Value Date   CHOL 135 02/20/2021   HDL 45.20 02/20/2021   LDLCALC 70 02/20/2021   LDLDIRECT 117.0 08/10/2016   TRIG 98.0 02/20/2021   CHOLHDL 3 02/20/2021   Lab Results  Component Value Date   VD25OH 54.1 03/16/2021   VD25OH 21.2 (L) 12/01/2020   Lab Results  Component Value Date   WBC 4.6 12/01/2020   HGB 13.9 12/01/2020   HCT 42.5 12/01/2020   MCV 89 12/01/2020   PLT 313 12/01/2020   No results found for: IRON, TIBC, FERRITIN  Attestation Statements:   Reviewed by clinician on day of visit: allergies, medications, problem list, medical history, surgical history, family history, social history, and previous encounter notes.   I, Trixie Dredge, am acting as transcriptionist for Dennard Nip, MD.  I have reviewed the above documentation for accuracy and completeness, and I agree with the above. -  Dennard Nip, MD

## 2021-05-10 ENCOUNTER — Ambulatory Visit (INDEPENDENT_AMBULATORY_CARE_PROVIDER_SITE_OTHER): Payer: BC Managed Care – PPO | Admitting: Physician Assistant

## 2021-05-11 ENCOUNTER — Encounter (INDEPENDENT_AMBULATORY_CARE_PROVIDER_SITE_OTHER): Payer: Self-pay | Admitting: Family Medicine

## 2021-05-11 ENCOUNTER — Ambulatory Visit (INDEPENDENT_AMBULATORY_CARE_PROVIDER_SITE_OTHER): Payer: BC Managed Care – PPO | Admitting: Family Medicine

## 2021-05-11 ENCOUNTER — Other Ambulatory Visit: Payer: Self-pay

## 2021-05-11 VITALS — BP 146/76 | HR 88 | Temp 97.7°F | Ht 74.0 in | Wt 319.0 lb

## 2021-05-11 DIAGNOSIS — Z9189 Other specified personal risk factors, not elsewhere classified: Secondary | ICD-10-CM | POA: Diagnosis not present

## 2021-05-11 DIAGNOSIS — Z6841 Body Mass Index (BMI) 40.0 and over, adult: Secondary | ICD-10-CM | POA: Diagnosis not present

## 2021-05-11 DIAGNOSIS — I10 Essential (primary) hypertension: Secondary | ICD-10-CM | POA: Diagnosis not present

## 2021-05-11 DIAGNOSIS — E1169 Type 2 diabetes mellitus with other specified complication: Secondary | ICD-10-CM

## 2021-05-11 DIAGNOSIS — E66813 Obesity, class 3: Secondary | ICD-10-CM

## 2021-05-11 NOTE — Progress Notes (Signed)
Chief Complaint:   OBESITY Charles Watkins is here to discuss his progress with his obesity treatment plan along with follow-up of his obesity related diagnoses. Charles Watkins is on the Category 4 Plan or following a lower carbohydrate, vegetable and lean protein rich diet plan and states he is following his eating plan approximately 90% of the time. Charles Watkins states he is on the bike and treadmill for 15 minutes.  Today's visit was #: 11 Starting weight: 328 lbs Starting date: 12/01/2020 Today's weight: 319 lbs Today's date: 05/11/2021 Total lbs lost to date: 9 Total lbs lost since last in-office visit: 0  Interim History: Charles Watkins is retaining some water weight. He is actually doing well with following his eating plan, but he is getting bored.  Subjective:   1. Type 2 diabetes mellitus with other specified complication, without long-term current use of insulin ([facility]) Charles Watkins is working on diet and weight loss, and he is stable on metformin. He has no signs of hypoglycemia. Last A1c was controlled at 6.3.  2. Essential hypertension Charles Watkins's blood pressure remains elevated despite taking his medications regularly. He is not on a diuretic. His next primary care physician's appointment is 3 months away.  3. At risk for heart disease Charles Watkins is at a higher than average risk for cardiovascular disease due to obesity.   Assessment/Plan:   1. Type 2 diabetes mellitus with other specified complication, without long-term current use of insulin ([place]) Charles Watkins will continue his metformin and diet, and will follow up at his next visit. Good blood sugar control is important to decrease the likelihood of diabetic complications such as nephropathy, neuropathy, limb loss, blindness, coronary artery disease, and death. Intensive lifestyle modification including diet, exercise and weight loss are the first line of treatment for diabetes.   2. Essential hypertension Charles Watkins agreed to start chlorthalidone 25 mg q AM with no refills,  and we will follow up on his blood pressure in 3-4 weeks. He will continue with diet, exercise, and his other medications to improve blood pressure control. He will watch for signs of hypotension as he continues his lifestyle modifications.  3. At risk for heart disease Charles Watkins was given approximately 15 minutes of coronary artery disease prevention counseling today. He is 55 y.o. male and has risk factors for heart disease including obesity. We discussed intensive lifestyle modifications today with an emphasis on specific weight loss instructions and strategies.   Repetitive spaced learning was employed today to elicit superior memory formation and behavioral change.  4. Obesity with current BMI 41.0 Charles Watkins is currently in the action stage of change. As such, his goal is to continue with weight loss efforts. He has agreed to change to keeping a food journal and adhering to recommended goals of 1500-2000 calories and 120+ grams of protein daily.   Exercise goals: As is.  Behavioral modification strategies: increasing lean protein intake and decreasing sodium intake.  Charles Watkins has agreed to follow-up with our clinic in 3 to 4 weeks. He was informed of the importance of frequent follow-up visits to maximize his success with intensive lifestyle modifications for his multiple health conditions.   Objective:   Blood pressure (!) 146/76, pulse 88, temperature 97.7 F (36.5 C), height '6\' 2"'$  (1.88 m), weight (!) 319 lb (144.7 kg), SpO2 97 %. Body mass index is 40.96 kg/m.  General: Cooperative, alert, well developed, in no acute distress. HEENT: Conjunctivae and lids unremarkable. Cardiovascular: Regular rhythm.  Lungs: Normal work of breathing. Neurologic: No focal deficits.  Lab Results  Component Value Date   CREATININE 1.05 03/16/2021   BUN 15 03/16/2021   NA 141 03/16/2021   K 4.4 03/16/2021   CL 104 03/16/2021   CO2 23 03/16/2021   Lab Results  Component Value Date   ALT 20  03/16/2021   AST 17 03/16/2021   ALKPHOS 57 03/16/2021   BILITOT <0.2 03/16/2021   Lab Results  Component Value Date   HGBA1C 6.3 02/20/2021   HGBA1C 6.6 (H) 12/01/2020   HGBA1C 6.4 08/22/2020   HGBA1C 5.9 (A) 02/19/2020   HGBA1C 6.1 11/18/2019   Lab Results  Component Value Date   INSULIN 9.4 12/01/2020   Lab Results  Component Value Date   TSH 2.510 12/01/2020   Lab Results  Component Value Date   CHOL 135 02/20/2021   HDL 45.20 02/20/2021   LDLCALC 70 02/20/2021   LDLDIRECT 117.0 08/10/2016   TRIG 98.0 02/20/2021   CHOLHDL 3 02/20/2021   Lab Results  Component Value Date   VD25OH 54.1 03/16/2021   VD25OH 21.2 (L) 12/01/2020   Lab Results  Component Value Date   WBC 4.6 12/01/2020   HGB 13.9 12/01/2020   HCT 42.5 12/01/2020   MCV 89 12/01/2020   PLT 313 12/01/2020   No results found for: IRON, TIBC, FERRITIN  Attestation Statements:   Reviewed by clinician on day of visit: allergies, medications, problem list, medical history, surgical history, family history, social history, and previous encounter notes.   I, Trixie Dredge, am acting as transcriptionist for Dennard Nip, MD.  I have reviewed the above documentation for accuracy and completeness, and I agree with the above. -  Dennard Nip, MD

## 2021-06-05 ENCOUNTER — Encounter (INDEPENDENT_AMBULATORY_CARE_PROVIDER_SITE_OTHER): Payer: Self-pay | Admitting: Family Medicine

## 2021-06-05 ENCOUNTER — Ambulatory Visit (INDEPENDENT_AMBULATORY_CARE_PROVIDER_SITE_OTHER): Payer: BC Managed Care – PPO | Admitting: Family Medicine

## 2021-06-05 ENCOUNTER — Other Ambulatory Visit: Payer: Self-pay

## 2021-06-05 VITALS — BP 136/76 | HR 79 | Temp 98.0°F | Ht 74.0 in | Wt 315.0 lb

## 2021-06-05 DIAGNOSIS — E1169 Type 2 diabetes mellitus with other specified complication: Secondary | ICD-10-CM | POA: Diagnosis not present

## 2021-06-05 DIAGNOSIS — Z6841 Body Mass Index (BMI) 40.0 and over, adult: Secondary | ICD-10-CM | POA: Diagnosis not present

## 2021-06-05 DIAGNOSIS — Z9189 Other specified personal risk factors, not elsewhere classified: Secondary | ICD-10-CM | POA: Diagnosis not present

## 2021-06-05 DIAGNOSIS — I10 Essential (primary) hypertension: Secondary | ICD-10-CM | POA: Diagnosis not present

## 2021-06-05 MED ORDER — CHLORTHALIDONE 25 MG PO TABS: 25.0000 mg | ORAL_TABLET | Freq: Every day | ORAL | 0 refills | Status: DC

## 2021-06-05 NOTE — Progress Notes (Signed)
Chief Complaint:   OBESITY Charles Watkins is here to discuss his progress with his obesity treatment plan along with follow-up of his obesity related diagnoses. Charles Watkins is on keeping a food journal and adhering to recommended goals of 1500-2000 calories and 120+ grams of protein daily and states he is following his eating plan approximately 98-100% of the time. Charles Watkins states he is walking, and elliptical for 30 minutes 3 times per week.  Today's visit was #: 12 Starting weight: 328 lbs Starting date: 12/01/2020 Today's weight: 315 lbs Today's date: 06/05/2021 Total lbs lost to date: 13 Total lbs lost since last in-office visit: 4  Interim History: Charles Watkins continues to do well with weight loss. He is journaling and he does well meeting his protein goals, but he struggles somewhat with his protein goals. His exercise is mostly cardio but with some increased resistance  for strengthening.  Subjective:   1. Type 2 diabetes mellitus with other specified complication, without long-term current use of insulin (HCC) Charles Watkins is stable on metformin. He is doing well with diet and exercise, and he denies hypoglycemia.  2. Essential hypertension Charles Watkins was prescribed Chlorthalidone, but the pharmacy didn't notify him that it was ready. He is doing well with weight loss.   3. At risk for heart disease Charles Watkins is at a higher than average risk for cardiovascular disease due to obesity.   Assessment/Plan:   1. Type 2 diabetes mellitus with other specified complication, without long-term current use of insulin ([place]) Charles Watkins will continue metformin, die, and exercise and we will recheck labs in 3 months. Good blood sugar control is important to decrease the likelihood of diabetic complications such as nephropathy, neuropathy, limb loss, blindness, coronary artery disease, and death. Intensive lifestyle modification including diet, exercise and weight loss are the first line of treatment for diabetes.   2. Essential  hypertension Charles Watkins agreed to start chlorthalidone 25 mg a daily with no refills, in addition to Norvasc. The goal id to be able to discontinue this as he continues to work on diet and exercise. He will watch for signs of hypotension as he continues his lifestyle modifications.  - chlorthalidone (HYGROTON) 25 MG tablet; Take 1 tablet (25 mg total) by mouth daily.  Dispense: 30 tablet; Refill: 0  3. At risk for heart disease Charles Watkins was given approximately 15 minutes of coronary artery disease prevention counseling today. He is 55 y.o. male and has risk factors for heart disease including obesity. We discussed intensive lifestyle modifications today with an emphasis on specific weight loss instructions and strategies.   Repetitive spaced learning was employed today to elicit superior memory formation and behavioral change.  4. Obesity with current BMI 41.0 Charles Watkins is currently in the action stage of change. As such, his goal is to continue with weight loss efforts. He has agreed to keeping a food journal and adhering to recommended goals of 1500-2000 calories and 120+ grams of protein daily .   We will recheck fasting labs at his next visit.  Exercise goals: As is.  Behavioral modification strategies: increasing lean protein intake.  Charles Watkins has agreed to follow-up with our clinic in 3 weeks. He was informed of the importance of frequent follow-up visits to maximize his success with intensive lifestyle modifications for his multiple health conditions.   Objective:   Blood pressure 136/76, pulse 79, temperature 98 F (36.7 C), height 6\' 2"  (1.88 m), weight (!) 315 lb (142.9 kg), SpO2 97 %. Body mass index is 40.44 kg/m.  General: Cooperative, alert, well developed, in no acute distress. HEENT: Conjunctivae and lids unremarkable. Cardiovascular: Regular rhythm.  Lungs: Normal work of breathing. Neurologic: No focal deficits.   Lab Results  Component Value Date   CREATININE 1.05 03/16/2021    BUN 15 03/16/2021   NA 141 03/16/2021   K 4.4 03/16/2021   CL 104 03/16/2021   CO2 23 03/16/2021   Lab Results  Component Value Date   ALT 20 03/16/2021   AST 17 03/16/2021   ALKPHOS 57 03/16/2021   BILITOT <0.2 03/16/2021   Lab Results  Component Value Date   HGBA1C 6.3 02/20/2021   HGBA1C 6.6 (H) 12/01/2020   HGBA1C 6.4 08/22/2020   HGBA1C 5.9 (A) 02/19/2020   HGBA1C 6.1 11/18/2019   Lab Results  Component Value Date   INSULIN 9.4 12/01/2020   Lab Results  Component Value Date   TSH 2.510 12/01/2020   Lab Results  Component Value Date   CHOL 135 02/20/2021   HDL 45.20 02/20/2021   LDLCALC 70 02/20/2021   LDLDIRECT 117.0 08/10/2016   TRIG 98.0 02/20/2021   CHOLHDL 3 02/20/2021   Lab Results  Component Value Date   VD25OH 54.1 03/16/2021   VD25OH 21.2 (L) 12/01/2020   Lab Results  Component Value Date   WBC 4.6 12/01/2020   HGB 13.9 12/01/2020   HCT 42.5 12/01/2020   MCV 89 12/01/2020   PLT 313 12/01/2020   No results found for: IRON, TIBC, FERRITIN  Attestation Statements:   Reviewed by clinician on day of visit: allergies, medications, problem list, medical history, surgical history, family history, social history, and previous encounter notes.   I, Trixie Dredge, am acting as transcriptionist for Dennard Nip, MD.  I have reviewed the above documentation for accuracy and completeness, and I agree with the above. -  Dennard Nip, MD

## 2021-06-12 ENCOUNTER — Encounter (INDEPENDENT_AMBULATORY_CARE_PROVIDER_SITE_OTHER): Payer: Self-pay | Admitting: Family Medicine

## 2021-06-12 LAB — HM DIABETES EYE EXAM

## 2021-06-12 NOTE — Telephone Encounter (Signed)
Last OV with Dr. Beasley 

## 2021-06-21 ENCOUNTER — Encounter: Payer: Self-pay | Admitting: Family Medicine

## 2021-06-26 ENCOUNTER — Other Ambulatory Visit (INDEPENDENT_AMBULATORY_CARE_PROVIDER_SITE_OTHER): Payer: Self-pay | Admitting: Family Medicine

## 2021-06-26 ENCOUNTER — Ambulatory Visit (INDEPENDENT_AMBULATORY_CARE_PROVIDER_SITE_OTHER): Payer: BC Managed Care – PPO | Admitting: Family Medicine

## 2021-06-26 ENCOUNTER — Encounter (INDEPENDENT_AMBULATORY_CARE_PROVIDER_SITE_OTHER): Payer: Self-pay | Admitting: Family Medicine

## 2021-06-26 ENCOUNTER — Other Ambulatory Visit: Payer: Self-pay

## 2021-06-26 VITALS — BP 136/75 | HR 90 | Temp 98.2°F | Ht 74.0 in | Wt 318.0 lb

## 2021-06-26 DIAGNOSIS — Z9189 Other specified personal risk factors, not elsewhere classified: Secondary | ICD-10-CM | POA: Diagnosis not present

## 2021-06-26 DIAGNOSIS — E785 Hyperlipidemia, unspecified: Secondary | ICD-10-CM

## 2021-06-26 DIAGNOSIS — E1169 Type 2 diabetes mellitus with other specified complication: Secondary | ICD-10-CM

## 2021-06-26 DIAGNOSIS — E559 Vitamin D deficiency, unspecified: Secondary | ICD-10-CM | POA: Diagnosis not present

## 2021-06-26 DIAGNOSIS — I10 Essential (primary) hypertension: Secondary | ICD-10-CM

## 2021-06-26 DIAGNOSIS — Z6841 Body Mass Index (BMI) 40.0 and over, adult: Secondary | ICD-10-CM

## 2021-06-26 MED ORDER — CHLORTHALIDONE 25 MG PO TABS: 25.0000 mg | ORAL_TABLET | Freq: Every day | ORAL | 0 refills | Status: DC

## 2021-06-26 NOTE — Progress Notes (Signed)
Chief Complaint:   OBESITY Charles Watkins is here to discuss his progress with his obesity treatment plan along with follow-up of his obesity related diagnoses. Charles Watkins is on keeping a food journal and adhering to recommended goals of 1500-2000 calories and 120+ grams of protein daily and states he is following his eating plan approximately 80% of the time. Charles Watkins states he is doing 0 minutes 0 times per week.  Today's visit was #: 13 Starting weight: 328 lbs Starting date: 12/01/2020 Today's weight: 318 lbs Today's date: 06/26/2021 Total lbs lost to date: 10 Total lbs lost since last in-office visit: 0  Interim History: Charles Watkins is up 3 lbs today, and it may be due to some fluid retention. His calories may be over goal as well. His hunger is mostly controlled.  Subjective:   1. Type 2 diabetes mellitus with other specified complication, without long-term current use of insulin ([place]) Charles Watkins is no checking his blood sugars at home. His last A1c was well controlled and he has been working on diet and weight loss.  2. Hyperlipidemia associated with type 2 diabetes mellitus ([place]) Charles Watkins is on [place] and Lipitor. He is working on [employer] and weight loss. He is due for labs.  3. Vitamin D deficiency Charles Watkins's last Vit D level was at goal, and he is due for labs. He denies nausea, vomiting, or muscle weakness.  4. Essential hypertension Charles Watkins's blood pressure is stable on his medications, and is now at goal. He is due for labs.  5. At risk for heart disease Charles Watkins is at a higher than average risk for cardiovascular disease due to obesity.   Assessment/Plan:   1. Type 2 diabetes mellitus with other specified complication, without long-term current use of insulin (HCC) We will check labs today. Charles Watkins will continue to follow up as directed. Good blood sugar control is important to decrease the likelihood of diabetic complications such as nephropathy, neuropathy, limb loss, blindness, coronary artery  disease, and death. Intensive lifestyle modification including diet, exercise and weight loss are the first line of treatment for diabetes.   - Insulin, random - Hemoglobin A1c  2. Hyperlipidemia associated with type 2 diabetes mellitus ([place]) Cardiovascular risk and specific lipid/LDL goals reviewed. We discussed several lifestyle modifications today. We will check labs today. Charles Watkins will continue to work on diet, exercise and weight loss efforts. Orders and follow up as documented in patient record.   - Lipid Panel With LDL/HDL Ratio  3. Vitamin D deficiency Low Vitamin D level contributes to fatigue and are associated with obesity, breast, and colon cancer. We will check labs today. Charles Watkins will follow-up for routine testing of Vitamin D, at least 2-3 times per year to avoid over-replacement.  - VITAMIN D 25 Hydroxy (Vit-D Deficiency, Fractures)  4. Essential hypertension Charles Watkins will continue with his diet weight loss to improve blood pressure control. We will check labs today. He will watch for signs of hypotension as he continues his lifestyle modifications.  - CMP14+EGFR - chlorthalidone (HYGROTON) 25 MG tablet; Take 1 tablet (25 mg total) by mouth daily.  Dispense: 30 tablet; Refill: 0  5. At risk for heart disease Charles Watkins was given approximately 15 minutes of coronary artery disease prevention counseling today. He is 55 y.o. male and has risk factors for heart disease including obesity. We discussed intensive lifestyle modifications today with an emphasis on specific weight loss instructions and strategies.   Repetitive spaced learning was employed today to elicit superior memory formation and  behavioral change.  6. Obesity with current BMI 40.9 Charles Watkins is currently in the action stage of change. As such, his goal is to continue with weight loss efforts. He has agreed to the Category 3 Plan.   Charles Watkins is to go back to Category 3, as his weight loss was better on a structured  plan.  Behavioral modification strategies: increasing lean protein intake.  Charles Watkins has agreed to follow-up with our clinic in 2 to 3 weeks. He was informed of the importance of frequent follow-up visits to maximize his success with intensive lifestyle modifications for his multiple health conditions.   Charles Watkins was informed we would discuss his lab results at his next visit unless there is a critical issue that needs to be addressed sooner. Charles Watkins agreed to keep his next visit at the agreed upon time to discuss these results.  Objective:   Blood pressure 136/75, pulse 90, temperature 98.2 F (36.8 C), height 6' 2"  (1.88 m), weight (!) 318 lb (144.2 kg), SpO2 98 %. Body mass index is 40.83 kg/m.  General: Cooperative, alert, well developed, in no acute distress. HEENT: Conjunctivae and lids unremarkable. Cardiovascular: Regular rhythm.  Lungs: Normal work of breathing. Neurologic: No focal deficits.   Lab Results  Component Value Date   CREATININE 1.05 03/16/2021   BUN 15 03/16/2021   NA 141 03/16/2021   K 4.4 03/16/2021   CL 104 03/16/2021   CO2 23 03/16/2021   Lab Results  Component Value Date   ALT 20 03/16/2021   AST 17 03/16/2021   ALKPHOS 57 03/16/2021   BILITOT <0.2 03/16/2021   Lab Results  Component Value Date   HGBA1C 6.3 02/20/2021   HGBA1C 6.6 (H) 12/01/2020   HGBA1C 6.4 08/22/2020   HGBA1C 5.9 (A) 02/19/2020   HGBA1C 6.1 11/18/2019   Lab Results  Component Value Date   INSULIN 9.4 12/01/2020   Lab Results  Component Value Date   TSH 2.510 12/01/2020   Lab Results  Component Value Date   CHOL 135 02/20/2021   HDL 45.20 02/20/2021   LDLCALC 70 02/20/2021   LDLDIRECT 117.0 08/10/2016   TRIG 98.0 02/20/2021   CHOLHDL 3 02/20/2021   Lab Results  Component Value Date   VD25OH 54.1 03/16/2021   VD25OH 21.2 (L) 12/01/2020   Lab Results  Component Value Date   WBC 4.6 12/01/2020   HGB 13.9 12/01/2020   HCT 42.5 12/01/2020   MCV 89 12/01/2020    PLT 313 12/01/2020   No results found for: IRON, TIBC, FERRITIN  Attestation Statements:   Reviewed by clinician on day of visit: allergies, medications, problem list, medical history, surgical history, family history, social history, and previous encounter notes.   I, Trixie Dredge, am acting as transcriptionist for Dennard Nip, MD.  I have reviewed the above documentation for accuracy and completeness, and I agree with the above. -  Dennard Nip, MD

## 2021-06-26 NOTE — Telephone Encounter (Signed)
Dr.Beasley 

## 2021-06-26 NOTE — Telephone Encounter (Signed)
LAST APPOINTMENT DATE: 06/26/21 NEXT APPOINTMENT DATE: 07/10/21   [facility]  ([place]) - [place], [place] [address] [place] [address] Phone: [phone] Fax: [phone]  [id number] - [place], [place] - [place] [address] [place] [address] [place] [address] Phone: [phone] Fax: [phone]  [facility] ([facility]) - [place], [place] [address] Phone: [phone] Fax: [phone]  Patient is requesting a refill of the following medications: Requested Prescriptions   Pending Prescriptions Disp Refills   amLODipine ([place]) 10 MG tablet [Pharmacy Med Name: amLODIPine Besylate 10 MG Oral Tablet] 90 tablet 3    Sig: TAKE 1 TABLET BY MOUTH  DAILY    Date last filled: 04/20/21 Previously prescribed by Dr. Leafy Ro  Lab Results  Component Value Date   HGBA1C 6.3 02/20/2021   HGBA1C 6.6 (H) 12/01/2020   HGBA1C 6.4 08/22/2020   Lab Results  Component Value Date   MICROALBUR 2.5 (H) 08/29/2020   LDLCALC 70 02/20/2021   CREATININE 1.05 03/16/2021   Lab Results  Component Value Date   VD25OH 54.1 03/16/2021   VD25OH 21.2 (L) 12/01/2020    BP Readings from Last 3 Encounters:  06/26/21 136/75  06/05/21 136/76  05/11/21 (!) 146/76

## 2021-06-27 LAB — LIPID PANEL WITH LDL/HDL RATIO
Cholesterol, Total: 157 mg/dL (ref 100–199)
HDL: 49 mg/dL (ref >39)
LDL Chol Calc (NIH): 88 mg/dL (ref 0–99)
LDL/HDL Ratio: 1.8 ratio (ref 0.0–3.6)
Triglycerides: 110 mg/dL (ref 0–149)
VLDL Cholesterol Cal: 20 mg/dL (ref 5–40)

## 2021-06-27 LAB — CMP14+EGFR
ALT: 22 IU/L (ref 0–44)
AST: 19 IU/L (ref 0–40)
Albumin/Globulin Ratio: 1.4 (ref 1.2–2.2)
Albumin: 4.5 g/dL (ref 3.8–4.9)
Alkaline Phosphatase: 69 IU/L (ref 44–121)
BUN/Creatinine Ratio: 13 (ref 9–20)
BUN: 13 mg/dL (ref 6–24)
Bilirubin Total: <0.2 mg/dL (ref 0.0–1.2)
CO2: 25 mmol/L (ref 20–29)
Calcium: 9.4 mg/dL (ref 8.7–10.2)
Chloride: 101 mmol/L (ref 96–106)
Creatinine, Ser: 1.03 mg/dL (ref 0.76–1.27)
Globulin, Total: 3.3 g/dL (ref 1.5–4.5)
Glucose: 112 mg/dL — ABNORMAL HIGH (ref 70–99)
Potassium: 3.9 mmol/L (ref 3.5–5.2)
Sodium: 139 mmol/L (ref 134–144)
Total Protein: 7.8 g/dL (ref 6.0–8.5)
eGFR: 86 mL/min/{1.73_m2} (ref >59)

## 2021-06-27 LAB — INSULIN, RANDOM: INSULIN: 18.9 u[IU]/mL (ref 2.6–24.9)

## 2021-06-27 LAB — HEMOGLOBIN A1C
Est. average glucose Bld gHb Est-mCnc: 126 mg/dL
Hgb A1c MFr Bld: 6 % — ABNORMAL HIGH (ref 4.8–5.6)

## 2021-06-27 LAB — VITAMIN D 25 HYDROXY (VIT D DEFICIENCY, FRACTURES): Vit D, 25-Hydroxy: 39.5 ng/mL (ref 30.0–100.0)

## 2021-07-09 ENCOUNTER — Other Ambulatory Visit (INDEPENDENT_AMBULATORY_CARE_PROVIDER_SITE_OTHER): Payer: Self-pay | Admitting: Family Medicine

## 2021-07-09 DIAGNOSIS — I10 Essential (primary) hypertension: Secondary | ICD-10-CM

## 2021-07-10 ENCOUNTER — Ambulatory Visit (INDEPENDENT_AMBULATORY_CARE_PROVIDER_SITE_OTHER): Payer: BC Managed Care – PPO | Admitting: Family Medicine

## 2021-07-10 NOTE — Telephone Encounter (Signed)
Pt last seen by Dr. Beasley.  

## 2021-07-18 ENCOUNTER — Other Ambulatory Visit (INDEPENDENT_AMBULATORY_CARE_PROVIDER_SITE_OTHER): Payer: Self-pay | Admitting: Family Medicine

## 2021-07-18 ENCOUNTER — Other Ambulatory Visit: Payer: Self-pay | Admitting: Family Medicine

## 2021-07-18 DIAGNOSIS — I10 Essential (primary) hypertension: Secondary | ICD-10-CM

## 2021-07-18 NOTE — Telephone Encounter (Signed)
Pt last seen by Dr. Beasley.  

## 2021-07-18 NOTE — Telephone Encounter (Signed)
LAST APPOINTMENT DATE: 06/26/21 NEXT APPOINTMENT DATE: 08/02/21   [facility] ([place]) - [place], [place] [address] [address] Phone: [phone] Fax: [phone]  [id number] - [place], [place] - [place] [address] [place] [address] [place] [address] Phone: [phone] Fax: [phone]  [facility] ([facility]) - [place], [place] [address] Phone: [phone] Fax: [phone]  Patient is requesting a refill of the following medications: Requested Prescriptions   Pending Prescriptions Disp Refills   amLODipine ([place]) 10 MG tablet [Pharmacy Med Name: amLODIPine Besylate 10 MG Oral Tablet] 90 tablet 3    Sig: TAKE 1 TABLET BY MOUTH  DAILY    Date last filled: 04/20/21 Previously prescribed by Dr. Leafy Ro  Lab Results  Component Value Date   HGBA1C 6.0 (H) 06/26/2021   HGBA1C 6.3 02/20/2021   HGBA1C 6.6 (H) 12/01/2020   Lab Results  Component Value Date   MICROALBUR 2.5 (H) 08/29/2020   [place] 88 06/26/2021   CREATININE 1.03 06/26/2021   Lab Results  Component Value Date   VD25OH 39.5 06/26/2021   VD25OH 54.1 03/16/2021   VD25OH 21.2 (L) 12/01/2020    BP Readings from Last 3 Encounters:  06/26/21 136/75  06/05/21 136/76  05/11/21 (!) 146/76

## 2021-07-18 NOTE — Telephone Encounter (Signed)
LAST APPOINTMENT DATE: 06/26/21 NEXT APPOINTMENT DATE: 08/02/21   [facility]) - [place], [place] [address] [address] Phone: [phone] Fax: [phone]  [id number] - [place], [place] - [address] [address] [place] [address] Phone: [phone] Fax: [phone]  [facility] ([facility]) - [place], [place] [address] Phone: [phone] Fax: [phone]  Patient is requesting a refill of the following medications: Requested Prescriptions   Pending Prescriptions Disp Refills   chlorthalidone (HYGROTON) 25 MG tablet [Pharmacy Med Name: Chlorthalidone 25 MG Oral Tablet] 30 tablet 11    Sig: TAKE 1 TABLET BY MOUTH  DAILY   amLODipine (NORVASC) 10 MG tablet [Pharmacy Med Name: amLODIPine Besylate 10 MG Oral Tablet] 90 tablet 3    Sig: TAKE 1 TABLET BY MOUTH  DAILY    Date last filled: 06/26/21 for [place]  04/20/21 for Amlodipine Previously prescribed by Dr. Leafy Ro  Lab Results  Component Value Date   HGBA1C 6.0 (H) 06/26/2021   HGBA1C 6.3 02/20/2021   HGBA1C 6.6 (H) 12/01/2020   Lab Results  Component Value Date   MICROALBUR 2.5 (H) 08/29/2020   [place] 88 06/26/2021   CREATININE 1.03 06/26/2021   Lab Results  Component Value Date   VD25OH 39.5 06/26/2021   VD25OH 54.1 03/16/2021   VD25OH 21.2 (L) 12/01/2020    BP Readings from Last 3 Encounters:  06/26/21 136/75  06/05/21 136/76  05/11/21 (!) 146/76  .

## 2021-08-02 ENCOUNTER — Encounter (INDEPENDENT_AMBULATORY_CARE_PROVIDER_SITE_OTHER): Payer: Self-pay | Admitting: Family Medicine

## 2021-08-02 ENCOUNTER — Ambulatory Visit (INDEPENDENT_AMBULATORY_CARE_PROVIDER_SITE_OTHER): Payer: BC Managed Care – PPO | Admitting: Family Medicine

## 2021-08-02 ENCOUNTER — Other Ambulatory Visit: Payer: Self-pay

## 2021-08-02 VITALS — BP 136/74 | Temp 98.5°F | Ht 74.0 in | Wt 325.0 lb

## 2021-08-02 DIAGNOSIS — E1169 Type 2 diabetes mellitus with other specified complication: Secondary | ICD-10-CM

## 2021-08-02 DIAGNOSIS — I10 Essential (primary) hypertension: Secondary | ICD-10-CM

## 2021-08-02 DIAGNOSIS — Z9189 Other specified personal risk factors, not elsewhere classified: Secondary | ICD-10-CM | POA: Diagnosis not present

## 2021-08-02 DIAGNOSIS — Z6841 Body Mass Index (BMI) 40.0 and over, adult: Secondary | ICD-10-CM | POA: Diagnosis not present

## 2021-08-02 MED ORDER — TIRZEPATIDE 2.5 MG/0.5ML ~~LOC~~ SOAJ: 2.5000 mg | SUBCUTANEOUS | 0 refills | Status: DC

## 2021-08-02 MED ORDER — AMLODIPINE BESYLATE 10 MG PO TABS: 10.0000 mg | ORAL_TABLET | Freq: Every day | ORAL | 0 refills | Status: DC

## 2021-08-02 MED ORDER — CHLORTHALIDONE 25 MG PO TABS: 25.0000 mg | ORAL_TABLET | Freq: Every day | ORAL | 0 refills | Status: DC

## 2021-08-02 NOTE — Progress Notes (Signed)
Chief Complaint:   OBESITY Charles Watkins is here to discuss his progress with his obesity treatment plan along with follow-up of his obesity related diagnoses. Charles Watkins is on the Category 4 Plan and states he is following his eating plan approximately 0% of the time. Charles Watkins states he is doing 0 minutes 0 times per week.  Today's visit was #: 14 Starting weight: 328 lbs Starting date: 12/01/2020 Today's weight: 325 lbs Today's date: 08/02/2021 Total lbs lost to date: 3 Total lbs lost since last in-office visit: 0  Interim History: Charles Watkins did some celebration eating over Thanksgiving. He is retaining some fluid after eating higher sodium foods. He didn't do as well with journaling and he is open to going back to his Category 4 balanced plan.  Subjective:   1. Type 2 diabetes mellitus with other specified complication, without long-term current use of insulin (HCC) Charles Watkins is on metformin and he is working on his diet. He is struggling with significant polyphagia however.  2. Essential hypertension Charles Watkins's blood pressure is stable on his medications. He denies chest pain, headache, or dizziness. He has had increased sodium in his diet recently.  3. At risk for heart disease Charles Watkins is at a higher than average risk for cardiovascular disease due to obesity.   Assessment/Plan:   1. Type 2 diabetes mellitus with other specified complication, without long-term current use of insulin (Charles Watkins) Charles Watkins agreed to start Mounjaro 2.5 mg q weekly with no refills, and he will continue metformin. Good blood sugar control is important to decrease the likelihood of diabetic complications such as nephropathy, neuropathy, limb loss, blindness, coronary artery disease, and death. Intensive lifestyle modification including diet, exercise and weight loss are the first line of treatment for diabetes.   2. Essential hypertension We will refill chlorthalidone and amlodipine for 1 month. Charles Watkins will continue with diet,  exercise, and decrease sodium intake as he continues his lifestyle modifications.  3. At risk for heart disease Charles Watkins was given approximately 15 minutes of coronary artery disease prevention counseling today. He is 55 y.o. male and has risk factors for heart disease including obesity. We discussed intensive lifestyle modifications today with an emphasis on specific weight loss instructions and strategies.   Repetitive spaced learning was employed today to elicit superior memory formation and behavioral change.  4. Obesity with current BMI 41.8 Charles Watkins is currently in the action stage of change. As such, his goal is to continue with weight loss efforts. He has agreed to the Category 4 Plan.   Behavioral modification strategies: decreasing sodium intake, no skipping meals, and holiday eating strategies .  Charles Watkins has agreed to follow-up with our clinic in 2 weeks. He was informed of the importance of frequent follow-up visits to maximize his success with intensive lifestyle modifications for his multiple health conditions.   Objective:   Blood pressure 136/74, temperature 98.5 F (36.9 C), height 6\' 2"  (1.88 m), weight (!) 325 lb (147.4 kg). Body mass index is 41.73 kg/m.  General: Cooperative, alert, well developed, in no acute distress. HEENT: Conjunctivae and lids unremarkable. Cardiovascular: Regular rhythm.  Lungs: Normal work of breathing. Neurologic: No focal deficits.   Lab Results  Component Value Date   CREATININE 1.03 06/26/2021   BUN 13 06/26/2021   NA 139 06/26/2021   K 3.9 06/26/2021   CL 101 06/26/2021   CO2 25 06/26/2021   Lab Results  Component Value Date   ALT 22 06/26/2021   AST 19 06/26/2021   ALKPHOS  69 06/26/2021   BILITOT <0.2 06/26/2021   Lab Results  Component Value Date   HGBA1C 6.0 (H) 06/26/2021   HGBA1C 6.3 02/20/2021   HGBA1C 6.6 (H) 12/01/2020   HGBA1C 6.4 08/22/2020   HGBA1C 5.9 (A) 02/19/2020   Lab Results  Component Value Date   INSULIN  18.9 06/26/2021   INSULIN 9.4 12/01/2020   Lab Results  Component Value Date   TSH 2.510 12/01/2020   Lab Results  Component Value Date   CHOL 157 06/26/2021   HDL 49 06/26/2021   LDLCALC 88 06/26/2021   LDLDIRECT 117.0 08/10/2016   TRIG 110 06/26/2021   CHOLHDL 3 02/20/2021   Lab Results  Component Value Date   VD25OH 39.5 06/26/2021   VD25OH 54.1 03/16/2021   VD25OH 21.2 (L) 12/01/2020   Lab Results  Component Value Date   WBC 4.6 12/01/2020   HGB 13.9 12/01/2020   HCT 42.5 12/01/2020   MCV 89 12/01/2020   PLT 313 12/01/2020   No results found for: IRON, TIBC, FERRITIN  Attestation Statements:   Reviewed by clinician on day of visit: allergies, medications, problem list, medical history, surgical history, family history, social history, and previous encounter notes.   I, Trixie Dredge, am acting as transcriptionist for Dennard Nip, MD.  I have reviewed the above documentation for accuracy and completeness, and I agree with the above. -  Dennard Nip, MD

## 2021-08-21 ENCOUNTER — Telehealth: Payer: Self-pay | Admitting: Family Medicine

## 2021-08-21 DIAGNOSIS — E559 Vitamin D deficiency, unspecified: Secondary | ICD-10-CM

## 2021-08-21 DIAGNOSIS — Z125 Encounter for screening for malignant neoplasm of prostate: Secondary | ICD-10-CM

## 2021-08-21 DIAGNOSIS — E1169 Type 2 diabetes mellitus with other specified complication: Secondary | ICD-10-CM

## 2021-08-21 DIAGNOSIS — I10 Essential (primary) hypertension: Secondary | ICD-10-CM

## 2021-08-21 DIAGNOSIS — E119 Type 2 diabetes mellitus without complications: Secondary | ICD-10-CM

## 2021-08-21 NOTE — Telephone Encounter (Signed)
-----   Message from Ellamae Sia sent at 08/14/2021 12:37 PM EST ----- Regarding: Lab orders for Tuesday, 12.20.22 Patient is scheduled for CPX labs, please order future labs, Thanks , Karna Christmas

## 2021-08-22 ENCOUNTER — Other Ambulatory Visit: Payer: BC Managed Care – PPO

## 2021-08-23 ENCOUNTER — Other Ambulatory Visit (INDEPENDENT_AMBULATORY_CARE_PROVIDER_SITE_OTHER): Payer: Self-pay | Admitting: Family Medicine

## 2021-08-23 DIAGNOSIS — E1169 Type 2 diabetes mellitus with other specified complication: Secondary | ICD-10-CM

## 2021-08-24 ENCOUNTER — Other Ambulatory Visit (INDEPENDENT_AMBULATORY_CARE_PROVIDER_SITE_OTHER): Payer: Self-pay | Admitting: Family Medicine

## 2021-08-24 ENCOUNTER — Other Ambulatory Visit: Payer: Self-pay

## 2021-08-24 ENCOUNTER — Encounter (INDEPENDENT_AMBULATORY_CARE_PROVIDER_SITE_OTHER): Payer: Self-pay | Admitting: Family Medicine

## 2021-08-24 ENCOUNTER — Ambulatory Visit (INDEPENDENT_AMBULATORY_CARE_PROVIDER_SITE_OTHER): Payer: BC Managed Care – PPO | Admitting: Family Medicine

## 2021-08-24 VITALS — BP 134/75 | HR 100 | Temp 98.2°F | Ht 74.0 in | Wt 323.0 lb

## 2021-08-24 DIAGNOSIS — I10 Essential (primary) hypertension: Secondary | ICD-10-CM | POA: Diagnosis not present

## 2021-08-24 DIAGNOSIS — Z9189 Other specified personal risk factors, not elsewhere classified: Secondary | ICD-10-CM | POA: Diagnosis not present

## 2021-08-24 DIAGNOSIS — Z6841 Body Mass Index (BMI) 40.0 and over, adult: Secondary | ICD-10-CM

## 2021-08-24 DIAGNOSIS — E1169 Type 2 diabetes mellitus with other specified complication: Secondary | ICD-10-CM

## 2021-08-24 MED ORDER — TIRZEPATIDE 2.5 MG/0.5ML ~~LOC~~ SOAJ: 2.5000 mg | SUBCUTANEOUS | 0 refills | Status: DC

## 2021-08-24 MED ORDER — CHLORTHALIDONE 25 MG PO TABS: 25.0000 mg | ORAL_TABLET | Freq: Every day | ORAL | 0 refills | Status: DC

## 2021-08-24 NOTE — Progress Notes (Signed)
Chief Complaint:   OBESITY Charles Watkins is here to discuss his progress with his obesity treatment plan along with follow-up of his obesity related diagnoses. Charles Watkins is on the Category 4 Plan and states he is following his eating plan approximately 90% of the time. Charles Watkins states he is doing 0 minutes 0 times per week.  Today's visit was #: 15 Starting weight: 328 lbs Starting date: 12/01/2020 Today's weight: 323 lbs Today's date: 08/24/2021 Total lbs lost to date: 5 Total lbs lost since last in-office visit: 2  Interim History: Charles Watkins has done well with weight loss on his plan. His hunger is controlled and he is somewhat not able to eat all of the food on his plan.  Subjective:   1. Type 2 diabetes mellitus with other specified complication, without long-term current use of insulin ([place]) Charles Watkins is doing well on Mounjaro. He notes decreased polyphagia and a "dry throat", but that doesn't bother him much. He is trying to drink more water.  2. Essential hypertension Charles Watkins is stable on his medications, and his blood pressure is controlled. He is doing well with diet and weight loss.  3. At high risk for dehydration Charles Watkins is at high risk for dehydration especially while losing weight and on diuretic.  Assessment/Plan:   1. Type 2 diabetes mellitus with other specified complication, without long-term current use of insulin (HCC) We will refill Mounjaro for 1 month, and Jerre will continue to increase his water intake. Good blood sugar control is important to decrease the likelihood of diabetic complications such as nephropathy, neuropathy, limb loss, blindness, coronary artery disease, and death. Intensive lifestyle modification including diet, exercise and weight loss are the first line of treatment for diabetes.   - tirzepatide [facility]) 2.5 MG/0.5ML Pen; Inject 2.5 mg into the skin once a week.  Dispense: 2 mL; Refill: 0  2. Essential hypertension Charles Watkins will continue working on healthy  weight loss and exercise to improve blood pressure control. We will refill chlorthalidone for 1 month.  - chlorthalidone (HYGROTON) 25 MG tablet; Take 1 tablet (25 mg total) by mouth daily.  Dispense: 30 tablet; Refill: 0  3. At high risk for dehydration Charles Watkins was given approximately 15 minutes dehydration prevention counseling today. Charles Watkins is at risk for dehydration due to weight loss and current medication(s). He was encouraged to hydrate and monitor fluid status to avoid dehydration as well as weight loss plateaus.   4. Obesity with current BMI 41.5 Charles Watkins is currently in the action stage of change. As such, his goal is to continue with weight loss efforts. He has agreed to the Category 4 Plan.   Behavioral modification strategies: increasing lean protein intake and holiday eating strategies .  Charles Watkins has agreed to follow-up with our clinic in 3 to 4 weeks. He was informed of the importance of frequent follow-up visits to maximize his success with intensive lifestyle modifications for his multiple health conditions.   Objective:   Blood pressure 134/75, pulse 100, temperature 98.2 F (36.8 C), height 6\' 2"  (1.88 m), weight (!) 323 lb (146.5 kg), SpO2 98 %. Body mass index is 41.47 kg/m.  General: Cooperative, alert, well developed, in no acute distress. HEENT: Conjunctivae and lids unremarkable. Cardiovascular: Regular rhythm.  Lungs: Normal work of breathing. Neurologic: No focal deficits.   Lab Results  Component Value Date   CREATININE 1.03 06/26/2021   BUN 13 06/26/2021   NA 139 06/26/2021   K 3.9 06/26/2021   CL 101 06/26/2021  CO2 25 06/26/2021   Lab Results  Component Value Date   ALT 22 06/26/2021   AST 19 06/26/2021   ALKPHOS 69 06/26/2021   BILITOT <0.2 06/26/2021   Lab Results  Component Value Date   HGBA1C 6.0 (H) 06/26/2021   HGBA1C 6.3 02/20/2021   HGBA1C 6.6 (H) 12/01/2020   HGBA1C 6.4 08/22/2020   HGBA1C 5.9 (A) 02/19/2020   Lab Results  Component  Value Date   INSULIN 18.9 06/26/2021   INSULIN 9.4 12/01/2020   Lab Results  Component Value Date   TSH 2.510 12/01/2020   Lab Results  Component Value Date   CHOL 157 06/26/2021   HDL 49 06/26/2021   LDLCALC 88 06/26/2021   LDLDIRECT 117.0 08/10/2016   TRIG 110 06/26/2021   CHOLHDL 3 02/20/2021   Lab Results  Component Value Date   VD25OH 39.5 06/26/2021   VD25OH 54.1 03/16/2021   VD25OH 21.2 (L) 12/01/2020   Lab Results  Component Value Date   WBC 4.6 12/01/2020   HGB 13.9 12/01/2020   HCT 42.5 12/01/2020   MCV 89 12/01/2020   PLT 313 12/01/2020   No results found for: IRON, TIBC, FERRITIN  Attestation Statements:   Reviewed by clinician on day of visit: allergies, medications, problem list, medical history, surgical history, family history, social history, and previous encounter notes.   I, Trixie Dredge, am acting as transcriptionist for Dennard Nip, MD.  I have reviewed the above documentation for accuracy and completeness, and I agree with the above. -  Dennard Nip, MD

## 2021-08-29 ENCOUNTER — Encounter: Payer: Self-pay | Admitting: Family Medicine

## 2021-08-29 ENCOUNTER — Ambulatory Visit (INDEPENDENT_AMBULATORY_CARE_PROVIDER_SITE_OTHER): Payer: BC Managed Care – PPO | Admitting: Family Medicine

## 2021-08-29 ENCOUNTER — Other Ambulatory Visit: Payer: Self-pay

## 2021-08-29 VITALS — BP 128/72 | HR 99 | Temp 98.0°F | Ht 73.75 in | Wt 329.2 lb

## 2021-08-29 DIAGNOSIS — E119 Type 2 diabetes mellitus without complications: Secondary | ICD-10-CM | POA: Diagnosis not present

## 2021-08-29 DIAGNOSIS — Z23 Encounter for immunization: Secondary | ICD-10-CM

## 2021-08-29 DIAGNOSIS — I10 Essential (primary) hypertension: Secondary | ICD-10-CM

## 2021-08-29 DIAGNOSIS — Z0000 Encounter for general adult medical examination without abnormal findings: Secondary | ICD-10-CM | POA: Diagnosis not present

## 2021-08-29 DIAGNOSIS — Z125 Encounter for screening for malignant neoplasm of prostate: Secondary | ICD-10-CM

## 2021-08-29 DIAGNOSIS — E1169 Type 2 diabetes mellitus with other specified complication: Secondary | ICD-10-CM | POA: Diagnosis not present

## 2021-08-29 DIAGNOSIS — E785 Hyperlipidemia, unspecified: Secondary | ICD-10-CM | POA: Diagnosis not present

## 2021-08-29 DIAGNOSIS — E559 Vitamin D deficiency, unspecified: Secondary | ICD-10-CM | POA: Diagnosis not present

## 2021-08-29 LAB — LIPID PANEL
Cholesterol: 146 mg/dL (ref 0–200)
HDL: 39.2 mg/dL (ref >39.00)
LDL Cholesterol: 75 mg/dL (ref 0–99)
NonHDL: 106.89
Total CHOL/HDL Ratio: 4
Triglycerides: 157 mg/dL — ABNORMAL HIGH (ref 0.0–149.0)
VLDL: 31.4 mg/dL (ref 0.0–40.0)

## 2021-08-29 LAB — COMPREHENSIVE METABOLIC PANEL
ALT: 23 U/L (ref 0–53)
AST: 19 U/L (ref 0–37)
Albumin: 4.1 g/dL (ref 3.5–5.2)
Alkaline Phosphatase: 42 U/L (ref 39–117)
BUN: 17 mg/dL (ref 6–23)
CO2: 28 mEq/L (ref 19–32)
Calcium: 9.5 mg/dL (ref 8.4–10.5)
Chloride: 102 mEq/L (ref 96–112)
Creatinine, Ser: 1.02 mg/dL (ref 0.40–1.50)
GFR: 83.37 mL/min (ref >60.00)
Glucose, Bld: 107 mg/dL — ABNORMAL HIGH (ref 70–99)
Potassium: 3.6 mEq/L (ref 3.5–5.1)
Sodium: 137 mEq/L (ref 135–145)
Total Bilirubin: 0.4 mg/dL (ref 0.2–1.2)
Total Protein: 7.4 g/dL (ref 6.0–8.3)

## 2021-08-29 LAB — CBC WITH DIFFERENTIAL/PLATELET
Basophils Absolute: 0 10*3/uL (ref 0.0–0.1)
Basophils Relative: 0.7 % (ref 0.0–3.0)
Eosinophils Absolute: 0.2 10*3/uL (ref 0.0–0.7)
Eosinophils Relative: 3.3 % (ref 0.0–5.0)
HCT: 40.4 % (ref 39.0–52.0)
Hemoglobin: 13.6 g/dL (ref 13.0–17.0)
Lymphocytes Relative: 42.3 % (ref 12.0–46.0)
Lymphs Abs: 2 10*3/uL (ref 0.7–4.0)
MCHC: 33.7 g/dL (ref 30.0–36.0)
MCV: 89.1 fl (ref 78.0–100.0)
Monocytes Absolute: 0.5 10*3/uL (ref 0.1–1.0)
Monocytes Relative: 11.2 % (ref 3.0–12.0)
Neutro Abs: 2 10*3/uL (ref 1.4–7.7)
Neutrophils Relative %: 42.5 % — ABNORMAL LOW (ref 43.0–77.0)
Platelets: 284 10*3/uL (ref 150.0–400.0)
RBC: 4.53 Mil/uL (ref 4.22–5.81)
RDW: 13.6 % (ref 11.5–15.5)
WBC: 4.7 10*3/uL (ref 4.0–10.5)

## 2021-08-29 LAB — TSH: TSH: 1.59 u[IU]/mL (ref 0.35–5.50)

## 2021-08-29 LAB — VITAMIN D 25 HYDROXY (VIT D DEFICIENCY, FRACTURES): VITD: 38.8 ng/mL (ref 30.00–100.00)

## 2021-08-29 LAB — PSA: PSA: 0.65 ng/mL (ref 0.10–4.00)

## 2021-08-29 MED ORDER — LOSARTAN POTASSIUM 50 MG PO TABS: 50.0000 mg | ORAL_TABLET | Freq: Every day | ORAL | 3 refills | Status: DC

## 2021-08-29 MED ORDER — METFORMIN HCL 500 MG PO TABS
500.0000 mg | ORAL_TABLET | Freq: Two times a day (BID) | ORAL | 3 refills | Status: DC
Start: 2021-08-29 — End: 2022-05-11

## 2021-08-29 MED ORDER — ATORVASTATIN CALCIUM 10 MG PO TABS: 10.0000 mg | ORAL_TABLET | Freq: Every evening | ORAL | 3 refills | Status: DC

## 2021-08-29 MED ORDER — ESOMEPRAZOLE MAGNESIUM 20 MG PO CPDR: 20.0000 mg | DELAYED_RELEASE_CAPSULE | ORAL | 3 refills | Status: DC | PRN

## 2021-08-29 NOTE — Assessment & Plan Note (Signed)
Lab Results  Component Value Date   HGBA1C 6.0 (H) 06/26/2021   Fairly well controlled  Even better now with improved diet and wt loss from healthy weight clinic

## 2021-08-29 NOTE — Assessment & Plan Note (Signed)
Taking 2000 iu of D3 daily  No symptoms  Level ordered today

## 2021-08-29 NOTE — Assessment & Plan Note (Signed)
bp in fair control at this time  BP Readings from Last 1 Encounters:  08/29/21 128/72   No changes needed Most recent labs reviewed  Disc lifstyle change with low sodium diet and exercise  Very well controlled  Plan to continue  Amlodipine 10 mg daily  Losartan 50 mg daily  Chlorthalidone 35 mg daily  Labs ordered

## 2021-08-29 NOTE — Assessment & Plan Note (Addendum)
Disc goals for lipids and reasons to control them Rev last labs with pt Rev low sat fat diet in detail This has been fairly controlled with atorvastatin 10 mg daily and lofibra 134 mg daily  Lipid panel ordered

## 2021-08-29 NOTE — Assessment & Plan Note (Signed)
psa ordered No fam h/o prostate cancer  No voiding symptoms or changes

## 2021-08-29 NOTE — Progress Notes (Signed)
Subjective:    Patient ID: Charles Watkins, male    DOB: October 29, 1965, 55 y.o.   MRN: [id number]   This visit occurred during the SARS-CoV-2 public health emergency.  Safety protocols were in place, including screening questions prior to the visit, additional usage of staff PPE, and extensive cleaning of exam room while observing appropriate contact time as indicated for disinfecting solutions.   HPI Here for health maintenance exam and to review chronic medical problems    Wt Readings from Last 3 Encounters:  08/29/21 (!) 329 lb 4 oz (149.3 kg)  08/24/21 (!) 323 lb (146.5 kg)  08/02/21 (!) 325 lb (147.4 kg)   42.56 kg/m  Has lost 5 lb in care of the healthy weight clinic  He was down more and lowest wt was 318- got off track at thanksgiving    Doing ok  Working  Feeling pretty good   Zoster status - pt does not know if this is covered but interested Covid immunized  Flu shot - today  Tdap 08/2016  Eye exam 10/22  Colonoscopy 09/2018 with 5 y recall   Prostate health Lab Results  Component Value Date   PSA 0.47 08/22/2020   PSA 0.55 08/11/2019   PSA 1.58 08/15/2018  No voiding changes   HTN bp is stable today /good control  No cp or palpitations or headaches or edema  No side effects to medicines  BP Readings from Last 3 Encounters:  08/29/21 128/72  08/24/21 134/75  08/02/21 136/74     Amlodipine 10 mg daily  Losartan 50 mg daily  Chlorthalidone 25 mg daily   He exercised before the holidays - elliptical/bike 40 minutes   DM2  Lab Results  Component Value Date   HGBA1C 6.0 (H) 06/26/2021   Well controlled  Taking metformin 500 mg bid  On arb and statin  Eye exam utd Good foot care  Eating better with healthy weight diet as well  No sweets   Taking mounjaro 2.5 mg weekly from the healthy weight clinic  Tolerates this well   Hyperlipidemia Lab Results  Component Value Date   CHOL 157 06/26/2021   HDL 49 06/26/2021   LDLCALC 88  06/26/2021   LDLDIRECT 117.0 08/10/2016   TRIG 110 06/26/2021   CHOLHDL 3 02/20/2021  He is eats  Atorvastatin 10 mg daily  Lofibra 134 mg daily   Takes nexium for GERD Lab Results  Component Value Date   VITAMINB12 637 12/01/2020   H/o vit D def  He takes vitamin D daily - 2000 iu   Has an area of swelling on R shin/medial May have injured it bumping into a machine It   Patient Active Problem List   Diagnosis Date Noted   Vitamin D deficiency 02/27/2021   Left low back pain 02/27/2021   Colon cancer screening 08/18/2018   Left knee pain 02/10/2018   Controlled type 2 diabetes mellitus without complication, without long-term current use of insulin ([place]) 11/04/2017   H/O motion sickness 11/04/2017   Prostate cancer screening 08/03/2015   Routine general medical examination at a health care facility 03/22/2013   Morbid obesity ([place]) 09/23/2012   Essential hypertension 08/14/2007   Allergic rhinitis 08/14/2007   BAKER'S CYST 08/14/2007   SLEEP APNEA 08/14/2007   Hyperlipidemia associated with type 2 diabetes mellitus ([place]) 01/16/2007   ADVEF, DRUG/MEDICINAL/BIOLOGICAL SUBST NOS 01/16/2007   Past Medical History:  Diagnosis Date   Allergic rhinitis    Allergy  Arthritis    knee   Diabetes mellitus without complication (HCC)    GERD (gastroesophageal reflux disease)    Hyperglycemia    Hyperlipidemia    Hypertension    Other fatigue    Shortness of breath on exertion    Past Surgical History:  Procedure Laterality Date   Baker's cyst - aspirated  03/2005   KNEE ARTHROSCOPY     WISDOM TOOTH EXTRACTION     Social History   Tobacco Use   Smoking status: Former    Packs/day: 1.00    Years: 24.00    Pack years: 24.00    Types: Cigarettes    Quit date: 08/06/2011    Years since quitting: 10.0   Smokeless tobacco: Current    Types: Chew   Tobacco comments:    non smoker  Vaping Use   Vaping Use: Never used  Substance Use Topics   Alcohol use: Yes     Alcohol/week: 0.0 standard drinks    Comment: drinks on weekend   Drug use: No   Family History  Problem Relation Age of Onset   Heart disease Mother    Thyroid disease Mother    Hypertension Mother    Brain cancer Mother    Stroke Mother    Cancer Mother    Alcoholism Father    Colon cancer Neg Hx    Colon polyps Neg Hx    Esophageal cancer Neg Hx    Rectal cancer Neg Hx    Stomach cancer Neg Hx    Allergies  Allergen Reactions   Lisinopril Cough   Current Outpatient Medications on File Prior to Visit  Medication Sig Dispense Refill   amLODipine (NORVASC) 10 MG tablet Take 1 tablet (10 mg total) by mouth daily. 90 tablet 0   aspirin 81 MG tablet Take 81 mg by mouth daily.     cetirizine (ZYRTEC) 10 MG tablet Take 10 mg by mouth every other day.     chlorthalidone (HYGROTON) 25 MG tablet Take 1 tablet (25 mg total) by mouth daily. 30 tablet 0   fenofibrate micronized (LOFIBRA) 134 MG capsule Take 1 capsule (134 mg total) by mouth daily. 90 capsule 3   Multiple Vitamin (MULTIVITAMIN) tablet Take 1 tablet by mouth daily.     polyethylene glycol powder (GLYCOLAX/MIRALAX) 17 GM/SCOOP powder Take 17 g by mouth daily. 3350 g 0   tirzepatide (MOUNJARO) 2.5 MG/0.5ML Pen Inject 2.5 mg into the skin once a week. 2 mL 0   No current facility-administered medications on file prior to visit.    Review of Systems  Constitutional:  Positive for fatigue. Negative for activity change, appetite change, fever and unexpected weight change.  HENT:  Negative for congestion, rhinorrhea, sore throat and trouble swallowing.   Eyes:  Negative for pain, redness, itching and visual disturbance.  Respiratory:  Negative for cough, chest tightness, shortness of breath and wheezing.   Cardiovascular:  Negative for chest pain and palpitations.  Gastrointestinal:  Negative for abdominal pain, blood in stool, constipation, diarrhea and nausea.  Endocrine: Negative for cold intolerance, heat intolerance,  polydipsia and polyuria.  Genitourinary:  Negative for difficulty urinating, dysuria, frequency and urgency.  Musculoskeletal:  Negative for arthralgias, joint swelling and myalgias.  Skin:  Negative for pallor and rash.  Neurological:  Negative for dizziness, tremors, weakness, numbness and headaches.  Hematological:  Negative for adenopathy. Does not bruise/bleed easily.  Psychiatric/Behavioral:  Negative for decreased concentration and dysphoric mood. The patient is not nervous/anxious.  Objective:   Physical Exam Constitutional:      General: He is not in acute distress.    Appearance: Normal appearance. He is well-developed. He is obese. He is not ill-appearing or diaphoretic.  HENT:     Head: Normocephalic and atraumatic.     Right Ear: Tympanic membrane, ear canal and external ear normal.     Left Ear: Tympanic membrane, ear canal and external ear normal.     Nose: Nose normal. No congestion.     Mouth/Throat:     Mouth: Mucous membranes are moist.     Pharynx: Oropharynx is clear. No posterior oropharyngeal erythema.  Eyes:     General: No scleral icterus.       Right eye: No discharge.        Left eye: No discharge.     Conjunctiva/sclera: Conjunctivae normal.     Pupils: Pupils are equal, round, and reactive to light.  Neck:     Thyroid: No thyromegaly.     Vascular: No carotid bruit or JVD.  Cardiovascular:     Rate and Rhythm: Normal rate and regular rhythm.     Pulses: Normal pulses.     Heart sounds: Normal heart sounds.    No gallop.  Pulmonary:     Effort: Pulmonary effort is normal. No respiratory distress.     Breath sounds: Normal breath sounds. No wheezing or rales.     Comments: Good air exch Chest:     Chest wall: No tenderness.  Abdominal:     General: Bowel sounds are normal. There is no distension or abdominal bruit.     Palpations: Abdomen is soft. There is no mass.     Tenderness: There is no abdominal tenderness.     Hernia: No hernia is  present.  Musculoskeletal:        General: No tenderness.     Cervical back: Normal range of motion and neck supple. No rigidity. No muscular tenderness.     Right lower leg: No edema.     Left lower leg: No edema.     Comments: No kyphosis  No acute joint changes  Lymphadenopathy:     Cervical: No cervical adenopathy.  Skin:    General: Skin is warm and dry.     Coloration: Skin is not pale.     Findings: No erythema or rash.     Comments: Skin tags noted on neck bilaterally  Neurological:     Mental Status: He is alert.     Cranial Nerves: No cranial nerve deficit.     Motor: No abnormal muscle tone.     Coordination: Coordination normal.     Gait: Gait normal.     Deep Tendon Reflexes: Reflexes are normal and symmetric. Reflexes normal.  Psychiatric:        Mood and Affect: Mood normal.        Cognition and Memory: Cognition and memory normal.          Assessment & Plan:   Problem List Items Addressed This Visit       Cardiovascular and Mediastinum   Essential hypertension    bp in fair control at this time  BP Readings from Last 1 Encounters:  08/29/21 128/72  No changes needed Most recent labs reviewed  Disc lifstyle change with low sodium diet and exercise  Very well controlled  Plan to continue  Amlodipine 10 mg daily  Losartan 50 mg daily  Chlorthalidone 35 mg daily  Labs  ordered      Relevant Medications   atorvastatin (LIPITOR) 10 MG tablet   losartan (COZAAR) 50 MG tablet     Endocrine   Hyperlipidemia associated with type 2 diabetes mellitus (HCC)    Disc goals for lipids and reasons to control them Rev last labs with pt Rev low sat fat diet in detail This has been fairly controlled with atorvastatin 10 mg daily and lofibra 134 mg daily  Lipid panel ordered       Relevant Medications   atorvastatin (LIPITOR) 10 MG tablet   metFORMIN (GLUCOPHAGE) 500 MG tablet   losartan (COZAAR) 50 MG tablet   Controlled type 2 diabetes mellitus  without complication, without long-term current use of insulin (HCC)    Lab Results  Component Value Date   HGBA1C 6.0 (H) 06/26/2021  Fairly well controlled  Even better now with improved diet and wt loss from healthy weight clinic       Relevant Medications   atorvastatin (LIPITOR) 10 MG tablet   metFORMIN (GLUCOPHAGE) 500 MG tablet   losartan (COZAAR) 50 MG tablet     Other   Morbid obesity (HCC)    Discussed how this problem influences overall health and the risks it imposes  Reviewed plan for weight loss with lower calorie diet (via better food choices and also portion control or program like weight watchers) and exercise building up to or more than 30 minutes 5 days per week including some aerobic activity   Plans to continue f/u at the [facility]      Relevant Medications   metFORMIN (GLUCOPHAGE) 500 MG tablet   Routine general medical examination at a health care facility - Primary    Reviewed health habits including diet and exercise and skin cancer prevention Reviewed appropriate screening tests for age  Also reviewed health mt list, fam hx and immunization status , as well as social and family history   See HPI Labs ordered  Reviewed info re: shingrix vaccines/will check on coverage Flu shot given  Eye exam utd  Colonoscopy utd psa ordered/no voiding changes       Prostate cancer screening    psa ordered No fam h/o prostate cancer  No voiding symptoms or changes       Vitamin D deficiency    Taking 2000 iu of D3 daily  No symptoms  Level ordered today      Other Visit Diagnoses     Need for influenza vaccination       Relevant Orders   Flu Vaccine QUAD 6+ mos PF IM (Fluarix Quad PF) (Completed)

## 2021-08-29 NOTE — Assessment & Plan Note (Signed)
Discussed how this problem influences overall health and the risks it imposes  Reviewed plan for weight loss with lower calorie diet (via better food choices and also portion control or program like weight watchers) and exercise building up to or more than 30 minutes 5 days per week including some aerobic activity   Plans to continue f/u at the healthy weight and wellness center

## 2021-08-29 NOTE — Patient Instructions (Addendum)
If you are interested in the shingles vaccine series (Shingrix), call your insurance or pharmacy to check on coverage and location it must be given.  If affordable - you can schedule it here or at your pharmacy depending on coverage   Try and exercise regularly -get back to your routine   Take care of yourself  Keep working on weight loss   Flu shot today  Labs today

## 2021-08-29 NOTE — Assessment & Plan Note (Signed)
Reviewed health habits including diet and exercise and skin cancer prevention Reviewed appropriate screening tests for age  Also reviewed health mt list, fam hx and immunization status , as well as social and family history   See HPI Labs ordered  Reviewed info re: shingrix vaccines/will check on coverage Flu shot given  Eye exam utd  Colonoscopy utd psa ordered/no voiding changes

## 2021-09-05 ENCOUNTER — Other Ambulatory Visit (INDEPENDENT_AMBULATORY_CARE_PROVIDER_SITE_OTHER): Payer: Self-pay | Admitting: Family Medicine

## 2021-09-05 DIAGNOSIS — I10 Essential (primary) hypertension: Secondary | ICD-10-CM

## 2021-09-06 NOTE — Telephone Encounter (Signed)
LAST APPOINTMENT DATE: 08/24/21 NEXT APPOINTMENT DATE: 10/10/21   [facility] - [place], [place] [facility] [place] [address] [address] Phone: [phone] Fax: [phone]  [id number] - [place], [place] - [place] AT [place] [address] [place] [address] Phone: [phone] Fax: [phone]  [facility] ([facility] ) - [place], [place] [address] Phone: [phone] Fax: [phone]  Patient is requesting a refill of the following medications: Requested Prescriptions   Pending Prescriptions Disp Refills   amLODipine ([place]) 10 MG tablet [Pharmacy Med Name: amLODIPine Besylate 10 MG Oral Tablet] 90 tablet 3    Sig: TAKE 1 TABLET BY MOUTH DAILY    Date last filled: 08/02/21 Previously prescribed by Dr. Leafy Ro  Lab Results  Component Value Date   HGBA1C 6.0 (H) 06/26/2021   HGBA1C 6.3 02/20/2021   HGBA1C 6.6 (H) 12/01/2020   Lab Results  Component Value Date   MICROALBUR 2.5 (H) 08/29/2020   LDLCALC 75 08/29/2021   CREATININE 1.02 08/29/2021   Lab Results  Component Value Date   VD25OH 38.80 08/29/2021   VD25OH 39.5 06/26/2021   VD25OH 54.1 03/16/2021    BP Readings from Last 3 Encounters:  08/29/21 128/72  08/24/21 134/75  08/02/21 136/74

## 2021-09-12 ENCOUNTER — Telehealth (INDEPENDENT_AMBULATORY_CARE_PROVIDER_SITE_OTHER): Payer: Self-pay

## 2021-09-12 NOTE — Telephone Encounter (Signed)
Appro[place]on January 9 Request Reference Number: [id number]. MOUNJARO INJ 2.5/0.5 is approved through 09/11/2022. Your patient may now fill this prescription and it will be covered.

## 2021-09-27 ENCOUNTER — Other Ambulatory Visit (INDEPENDENT_AMBULATORY_CARE_PROVIDER_SITE_OTHER): Payer: Self-pay | Admitting: Family Medicine

## 2021-09-27 DIAGNOSIS — I10 Essential (primary) hypertension: Secondary | ICD-10-CM

## 2021-09-27 NOTE — Telephone Encounter (Signed)
LAST APPOINTMENT DATE: 08/24/21 NEXT APPOINTMENT DATE: 10/08/21   [facility] - [place], [place] [facility] [place] [address] [address] Phone: [phone] Fax: [phone]  [id number] - [place], [place] - [place] AT [place] [address] [place] [address] Phone: [phone] Fax: [phone]  [facility] ([facility] ) - [place], [place] [address] Phone: [phone] Fax: [phone]  Patient is requesting a refill of the following medications: Requested Prescriptions   Pending Prescriptions Disp Refills   chlorthalidone (HYGROTON) 25 MG tablet [Pharmacy Med Name: Chlorthalidone 25 MG Oral Tablet] 30 tablet 11    Sig: TAKE 1 TABLET BY MOUTH DAILY    Date last filled: 08/24/21 Previously prescribed by Dr. Leafy Ro  Lab Results  Component Value Date   HGBA1C 6.0 (H) 06/26/2021   HGBA1C 6.3 02/20/2021   HGBA1C 6.6 (H) 12/01/2020   Lab Results  Component Value Date   MICROALBUR 2.5 (H) 08/29/2020   LDLCALC 75 08/29/2021   CREATININE 1.02 08/29/2021   Lab Results  Component Value Date   VD25OH 38.80 08/29/2021   VD25OH 39.5 06/26/2021   VD25OH 54.1 03/16/2021    BP Readings from Last 3 Encounters:  08/29/21 128/72  08/24/21 134/75  08/02/21 136/74

## 2021-10-08 ENCOUNTER — Other Ambulatory Visit (INDEPENDENT_AMBULATORY_CARE_PROVIDER_SITE_OTHER): Payer: Self-pay | Admitting: Family Medicine

## 2021-10-08 DIAGNOSIS — I10 Essential (primary) hypertension: Secondary | ICD-10-CM

## 2021-10-09 NOTE — Telephone Encounter (Signed)
Last OV with Dr. Beasley 

## 2021-10-10 ENCOUNTER — Ambulatory Visit (INDEPENDENT_AMBULATORY_CARE_PROVIDER_SITE_OTHER): Payer: BC Managed Care – PPO | Admitting: Family Medicine

## 2021-10-18 ENCOUNTER — Other Ambulatory Visit (INDEPENDENT_AMBULATORY_CARE_PROVIDER_SITE_OTHER): Payer: Self-pay | Admitting: Family Medicine

## 2021-10-18 ENCOUNTER — Other Ambulatory Visit (INDEPENDENT_AMBULATORY_CARE_PROVIDER_SITE_OTHER): Payer: Self-pay | Admitting: Physician Assistant

## 2021-10-18 DIAGNOSIS — I10 Essential (primary) hypertension: Secondary | ICD-10-CM

## 2021-10-18 DIAGNOSIS — E7849 Other hyperlipidemia: Secondary | ICD-10-CM

## 2021-12-01 ENCOUNTER — Encounter: Payer: Self-pay | Admitting: Family Medicine

## 2021-12-01 DIAGNOSIS — I10 Essential (primary) hypertension: Secondary | ICD-10-CM

## 2021-12-01 MED ORDER — AMLODIPINE BESYLATE 10 MG PO TABS: 10.0000 mg | ORAL_TABLET | Freq: Every day | ORAL | 1 refills | Status: DC

## 2021-12-02 ENCOUNTER — Other Ambulatory Visit (INDEPENDENT_AMBULATORY_CARE_PROVIDER_SITE_OTHER): Payer: Self-pay | Admitting: Physician Assistant

## 2021-12-02 DIAGNOSIS — E7849 Other hyperlipidemia: Secondary | ICD-10-CM

## 2022-01-31 ENCOUNTER — Other Ambulatory Visit (INDEPENDENT_AMBULATORY_CARE_PROVIDER_SITE_OTHER): Payer: Self-pay | Admitting: Physician Assistant

## 2022-01-31 DIAGNOSIS — E7849 Other hyperlipidemia: Secondary | ICD-10-CM

## 2022-02-22 ENCOUNTER — Encounter: Payer: Self-pay | Admitting: Family Medicine

## 2022-02-23 ENCOUNTER — Other Ambulatory Visit: Payer: Self-pay

## 2022-02-23 DIAGNOSIS — E7849 Other hyperlipidemia: Secondary | ICD-10-CM

## 2022-02-23 MED ORDER — FENOFIBRATE MICRONIZED 134 MG PO CAPS: 134.0000 mg | ORAL_CAPSULE | Freq: Every day | ORAL | 3 refills | Status: DC

## 2022-04-11 ENCOUNTER — Encounter (INDEPENDENT_AMBULATORY_CARE_PROVIDER_SITE_OTHER): Payer: Self-pay

## 2022-05-11 ENCOUNTER — Ambulatory Visit: Payer: BC Managed Care – PPO | Admitting: Family Medicine

## 2022-05-11 ENCOUNTER — Encounter: Payer: Self-pay | Admitting: Family Medicine

## 2022-05-11 VITALS — BP 150/82 | HR 84 | Temp 97.6°F | Ht 73.75 in | Wt 341.0 lb

## 2022-05-11 DIAGNOSIS — E7849 Other hyperlipidemia: Secondary | ICD-10-CM | POA: Diagnosis not present

## 2022-05-11 DIAGNOSIS — Z23 Encounter for immunization: Secondary | ICD-10-CM | POA: Diagnosis not present

## 2022-05-11 DIAGNOSIS — E1169 Type 2 diabetes mellitus with other specified complication: Secondary | ICD-10-CM | POA: Diagnosis not present

## 2022-05-11 DIAGNOSIS — E785 Hyperlipidemia, unspecified: Secondary | ICD-10-CM

## 2022-05-11 DIAGNOSIS — E119 Type 2 diabetes mellitus without complications: Secondary | ICD-10-CM | POA: Diagnosis not present

## 2022-05-11 DIAGNOSIS — I10 Essential (primary) hypertension: Secondary | ICD-10-CM

## 2022-05-11 LAB — LIPID PANEL
Cholesterol: 165 mg/dL (ref 0–200)
HDL: 46.6 mg/dL (ref >39.00)
LDL Cholesterol: 96 mg/dL (ref 0–99)
NonHDL: 118.08
Total CHOL/HDL Ratio: 4
Triglycerides: 112 mg/dL (ref 0.0–149.0)
VLDL: 22.4 mg/dL (ref 0.0–40.0)

## 2022-05-11 LAB — COMPREHENSIVE METABOLIC PANEL
ALT: 26 U/L (ref 0–53)
AST: 16 U/L (ref 0–37)
Albumin: 4 g/dL (ref 3.5–5.2)
Alkaline Phosphatase: 58 U/L (ref 39–117)
BUN: 12 mg/dL (ref 6–23)
CO2: 24 mEq/L (ref 19–32)
Calcium: 9.5 mg/dL (ref 8.4–10.5)
Chloride: 106 mEq/L (ref 96–112)
Creatinine, Ser: 0.96 mg/dL (ref 0.40–1.50)
GFR: 89.22 mL/min (ref >60.00)
Glucose, Bld: 129 mg/dL — ABNORMAL HIGH (ref 70–99)
Potassium: 4 mEq/L (ref 3.5–5.1)
Sodium: 138 mEq/L (ref 135–145)
Total Bilirubin: 0.3 mg/dL (ref 0.2–1.2)
Total Protein: 7.6 g/dL (ref 6.0–8.3)

## 2022-05-11 LAB — MICROALBUMIN / CREATININE URINE RATIO
Creatinine,U: 159.8 mg/dL
Microalb Creat Ratio: 1.9 mg/g (ref 0.0–30.0)
Microalb, Ur: 3 mg/dL — ABNORMAL HIGH (ref 0.0–1.9)

## 2022-05-11 LAB — HEMOGLOBIN A1C: Hgb A1c MFr Bld: 6.9 % — ABNORMAL HIGH (ref 4.6–6.5)

## 2022-05-11 MED ORDER — ATORVASTATIN CALCIUM 10 MG PO TABS: 10.0000 mg | ORAL_TABLET | Freq: Every evening | ORAL | 3 refills | Status: DC

## 2022-05-11 MED ORDER — FENOFIBRATE MICRONIZED 134 MG PO CAPS: 134.0000 mg | ORAL_CAPSULE | Freq: Every day | ORAL | 3 refills | Status: DC

## 2022-05-11 MED ORDER — AMLODIPINE BESYLATE 10 MG PO TABS: 10.0000 mg | ORAL_TABLET | Freq: Every day | ORAL | 3 refills | Status: DC

## 2022-05-11 MED ORDER — CHLORTHALIDONE 25 MG PO TABS: 25.0000 mg | ORAL_TABLET | Freq: Every day | ORAL | 3 refills | Status: DC

## 2022-05-11 MED ORDER — TIRZEPATIDE 2.5 MG/0.5ML ~~LOC~~ SOAJ: 2.5000 mg | SUBCUTANEOUS | 3 refills | Status: DC

## 2022-05-11 MED ORDER — LOSARTAN POTASSIUM 50 MG PO TABS: 50.0000 mg | ORAL_TABLET | Freq: Every day | ORAL | 3 refills | Status: DC

## 2022-05-11 MED ORDER — METFORMIN HCL 500 MG PO TABS: 500.0000 mg | ORAL_TABLET | Freq: Two times a day (BID) | ORAL | 3 refills | Status: DC

## 2022-05-11 NOTE — Assessment & Plan Note (Signed)
Discussed how this problem influences overall health and the risks it imposes  Reviewed plan for weight loss with lower calorie diet (via better food choices and also portion control or program like weight watchers) and exercise building up to or more than 30 minutes 5 days per week including some aerobic activity   Lost wt with the healthy wt clinic and mounjaro  No longer going/quit Some emotional eating  Had not been able to get the mounjaro- we plan to start it back when it is in stock

## 2022-05-11 NOTE — Assessment & Plan Note (Signed)
A1c ordered   Metformin  500 mg bid  Plan to add back mounjaro 2.5 mg weekly (he has gained wt when unable to get it)  Will see if pharmacy has it -tolerated well Disc imp of low glycemic diet and exercise  Per pt -eye exam will be this month Nl foot exam  F/u for annual exam in after 08/24/22

## 2022-05-11 NOTE — Patient Instructions (Signed)
If you are interested in the shingles vaccine series (Shingrix), call your insurance or pharmacy to check on coverage and location it must be given.  If affordable - you can schedule it here or at your pharmacy depending on coverage   Separate this from other shots by a month   Flu shot today   Labs today   Continue current medicines  Work on better diet  Try to get most of your carbohydrates from produce (with the exception of white potatoes)  Eat less bread/pasta/rice/snack foods/cereals/sweets and other items from the middle of the grocery store (processed carbs)  Go back on mounjaro injection if the pharmacy has it   Go back on cholorthalidone for blood pressure and swelling   Follow up in 1-2 months

## 2022-05-11 NOTE — Progress Notes (Unsigned)
Subjective:    Patient ID: Charles Watkins, male    DOB: [birth year], 56 y.o.   MRN: [id number]  HPI Pt presents for follow up of DM2 and HTN and chronic medical problems   Wt Readings from Last 3 Encounters:  05/11/22 (!) 341 lb (154.7 kg)  08/29/21 (!) 329 lb 4 oz (149.3 kg)  08/24/21 (!) 323 lb (146.5 kg)   44.08 kg/m  Feels ok  Not doing a lot   Quit going to the healthy weight clinic  Too much with his job  Did not like the food  Emotional eating with stress May be going on strike   Gained weight back  Off mounjaro and chlorthalidone    HTN bp may be up due to stress  No cp or palpitations or headaches or edema  No side effects to medicines  BP Readings from Last 3 Encounters:  05/11/22 (!) 150/82  08/29/21 128/72  08/24/21 134/75      Amlodipine 10 mg daily  Losartan 50 mg daily  Chlorthalidone 25 mg daily   Pulse Readings from Last 3 Encounters:  05/11/22 84  08/29/21 99  08/24/21 100     Lab Results  Component Value Date   CREATININE 1.02 08/29/2021   BUN 17 08/29/2021   NA 137 08/29/2021   K 3.6 08/29/2021   CL 102 08/29/2021   CO2 28 08/29/2021   DM2 Lab Results  Component Value Date   HGBA1C 6.0 (H) 06/26/2021   Due for labs  Was going to the healthy weight clinic  Metformin 500 mg bid  Mounjaro 2.5 mg weekly = pharmacy ran out of this  Is interested in starting back if it is in stock   No side effects     Hyperlipidemia Lab Results  Component Value Date   CHOL 146 08/29/2021   HDL 39.20 08/29/2021   LDLCALC 75 08/29/2021   LDLDIRECT 117.0 08/10/2016   TRIG 157.0 (H) 08/29/2021   CHOLHDL 4 08/29/2021   Atorvastatin 10 mg daily  Fenofibrate 134 mg daily   GERD-takes nexium 20 mg daily as needed   Patient Active Problem List   Diagnosis Date Noted   Vitamin D deficiency 02/27/2021   Left low back pain 02/27/2021   Colon cancer screening 08/18/2018   Left knee pain 02/10/2018   Controlled type 2 diabetes  mellitus without complication, without long-term current use of insulin ([place]) 11/04/2017   H/O motion sickness 11/04/2017   Prostate cancer screening 08/03/2015   Routine general medical examination at a health care facility 03/22/2013   Morbid obesity ([place]) 09/23/2012   Essential hypertension 08/14/2007   Allergic rhinitis 08/14/2007   BAKER'S CYST 08/14/2007   SLEEP APNEA 08/14/2007   Hyperlipidemia associated with type 2 diabetes mellitus ([place]) 01/16/2007   ADVEF, DRUG/MEDICINAL/BIOLOGICAL SUBST NOS 01/16/2007   Past Medical History:  Diagnosis Date   Allergic rhinitis    Allergy    Arthritis    knee   Diabetes mellitus without complication (HCC)    GERD (gastroesophageal reflux disease)    Hyperglycemia    Hyperlipidemia    Hypertension    Other fatigue    Shortness of breath on exertion    Past Surgical History:  Procedure Laterality Date   Baker's cyst - aspirated  03/2005   KNEE ARTHROSCOPY     WISDOM TOOTH EXTRACTION     Social History   Tobacco Use   Smoking status: Former    Packs/day: 1.00    Years:  24.00    Total pack years: 24.00    Types: Cigarettes    Quit date: 08/06/2011    Years since quitting: 10.7   Smokeless tobacco: Current    Types: Chew   Tobacco comments:    non smoker  Vaping Use   Vaping Use: Never used  Substance Use Topics   Alcohol use: Yes    Alcohol/week: 0.0 standard drinks of alcohol    Comment: drinks on weekend   Drug use: No   Family History  Problem Relation Age of Onset   Heart disease Mother    Thyroid disease Mother    Hypertension Mother    Brain cancer Mother    Stroke Mother    Cancer Mother    Alcoholism Father    Colon cancer Neg Hx    Colon polyps Neg Hx    Esophageal cancer Neg Hx    Rectal cancer Neg Hx    Stomach cancer Neg Hx    Allergies  Allergen Reactions   Lisinopril Cough   Current Outpatient Medications on File Prior to Visit  Medication Sig Dispense Refill   aspirin 81 MG tablet Take 81  mg by mouth daily.     cetirizine (ZYRTEC) 10 MG tablet Take 10 mg by mouth every other day.     esomeprazole (NEXIUM) 20 MG capsule Take 1 capsule (20 mg total) by mouth as needed. 90 capsule 3   Multiple Vitamin (MULTIVITAMIN) tablet Take 1 tablet by mouth daily.     polyethylene glycol powder (GLYCOLAX/MIRALAX) 17 GM/SCOOP powder Take 17 g by mouth daily. 3350 g 0   No current facility-administered medications on file prior to visit.     Review of Systems  Constitutional:  Negative for activity change, appetite change, fatigue, fever and unexpected weight change.  HENT:  Negative for congestion, rhinorrhea, sore throat and trouble swallowing.   Eyes:  Negative for pain, redness, itching and visual disturbance.  Respiratory:  Negative for cough, chest tightness, shortness of breath and wheezing.   Cardiovascular:  Negative for chest pain and palpitations.  Gastrointestinal:  Negative for abdominal pain, blood in stool, constipation, diarrhea and nausea.  Endocrine: Negative for cold intolerance, heat intolerance, polydipsia and polyuria.  Genitourinary:  Negative for difficulty urinating, dysuria, frequency and urgency.  Musculoskeletal:  Negative for arthralgias, joint swelling and myalgias.  Skin:  Negative for pallor and rash.  Neurological:  Negative for dizziness, tremors, weakness, numbness and headaches.  Hematological:  Negative for adenopathy. Does not bruise/bleed easily.  Psychiatric/Behavioral:  Negative for decreased concentration and dysphoric mood. The patient is not nervous/anxious.        Stress May be going on strike for work       Objective:   Physical Exam Constitutional:      General: He is not in acute distress.    Appearance: Normal appearance. He is well-developed. He is obese. He is not ill-appearing or diaphoretic.  HENT:     Head: Normocephalic and atraumatic.     Mouth/Throat:     Mouth: Mucous membranes are moist.  Eyes:     General: No scleral  icterus.    Conjunctiva/sclera: Conjunctivae normal.     Pupils: Pupils are equal, round, and reactive to light.  Neck:     Thyroid: No thyromegaly.     Vascular: No carotid bruit or JVD.  Cardiovascular:     Rate and Rhythm: Normal rate and regular rhythm.     Heart sounds: Normal heart sounds.  No gallop.  Pulmonary:     Effort: Pulmonary effort is normal. No respiratory distress.     Breath sounds: Normal breath sounds. No wheezing or rales.  Abdominal:     General: There is no distension or abdominal bruit.     Palpations: Abdomen is soft.  Musculoskeletal:     Cervical back: Normal range of motion and neck supple.     Right lower leg: No edema.     Left lower leg: No edema.  Lymphadenopathy:     Cervical: No cervical adenopathy.  Skin:    General: Skin is warm and dry.     Coloration: Skin is not pale.     Findings: No rash.  Neurological:     Mental Status: He is alert.     Coordination: Coordination normal.     Deep Tendon Reflexes: Reflexes are normal and symmetric. Reflexes normal.  Psychiatric:        Mood and Affect: Mood normal.           Assessment & Plan:   Problem List Items Addressed This Visit       Cardiovascular and Mediastinum   Essential hypertension - Primary    bp is up off the chlorthalidone and with poor eating BP Readings from Last 1 Encounters:  05/11/22 (!) 150/82  Most recent labs reviewed  Disc lifstyle change with low sodium diet and exercise   Plan to continue amlodipine 10 mg daily (causes some swelling)  Losartan 50 mg daily  Will add back chlorthalidone 25 mg daily  Labs today F/u in 1-2 mo for visit and labs       Relevant Medications   losartan (COZAAR) 50 MG tablet   amLODipine (NORVASC) 10 MG tablet   fenofibrate micronized (LOFIBRA) 134 MG capsule   atorvastatin (LIPITOR) 10 MG tablet   chlorthalidone (HYGROTON) 25 MG tablet   Other Relevant Orders   Comprehensive metabolic panel     Endocrine   Controlled  type 2 diabetes mellitus without complication, without long-term current use of insulin (HCC)    A1c ordered   Metformin  500 mg bid  Plan to add back mounjaro 2.5 mg weekly (he has gained wt when unable to get it)  Will see if pharmacy has it -tolerated well Disc imp of low glycemic diet and exercise  Per pt -eye exam will be this month Nl foot exam  F/u for annual exam in after 08/24/22      Relevant Medications   tirzepatide (MOUNJARO) 2.5 MG/0.5ML Pen   losartan (COZAAR) 50 MG tablet   metFORMIN (GLUCOPHAGE) 500 MG tablet   atorvastatin (LIPITOR) 10 MG tablet   Hyperlipidemia associated with type 2 diabetes mellitus ([place])    Disc goals for lipids and reasons to control them Rev last labs with pt Rev low sat fat diet in detail  Lab today Atorvastatin 10 mg daily  fenfibrate 134 mg daily  Diet has been sub optimal       Relevant Medications   tirzepatide (MOUNJARO) 2.5 MG/0.5ML Pen   losartan (COZAAR) 50 MG tablet   metFORMIN (GLUCOPHAGE) 500 MG tablet   amLODipine (NORVASC) 10 MG tablet   fenofibrate micronized (LOFIBRA) 134 MG capsule   atorvastatin (LIPITOR) 10 MG tablet   chlorthalidone (HYGROTON) 25 MG tablet   Other Relevant Orders   Lipid panel     Other   Morbid obesity ([place])    Discussed how this problem influences overall health and the risks it imposes  Reviewed plan for weight loss with lower calorie diet (via better food choices and also portion control or program like weight watchers) and exercise building up to or more than 30 minutes 5 days per week including some aerobic activity   Lost wt with the healthy wt clinic and mounjaro  No longer going/quit Some emotional eating  Had not been able to get the mounjaro- we plan to start it back when it is in stock       Relevant Medications   tirzepatide (MOUNJARO) 2.5 MG/0.5ML Pen   metFORMIN (GLUCOPHAGE) 500 MG tablet   Other Visit Diagnoses     Type 2 diabetes mellitus with other specified  complication, without long-term current use of insulin (HCC)       Relevant Medications   tirzepatide (MOUNJARO) 2.5 MG/0.5ML Pen   losartan (COZAAR) 50 MG tablet   metFORMIN (GLUCOPHAGE) 500 MG tablet   atorvastatin (LIPITOR) 10 MG tablet   Other Relevant Orders   Hemoglobin A1c   Microalbumin / creatinine urine ratio   Other hyperlipidemia       Relevant Medications   losartan (COZAAR) 50 MG tablet   amLODipine (NORVASC) 10 MG tablet   fenofibrate micronized (LOFIBRA) 134 MG capsule   atorvastatin (LIPITOR) 10 MG tablet   chlorthalidone (HYGROTON) 25 MG tablet   Other Relevant Orders   Lipid panel

## 2022-05-11 NOTE — Assessment & Plan Note (Signed)
bp is up off the chlorthalidone and with poor eating BP Readings from Last 1 Encounters:  05/11/22 (!) 150/82   Most recent labs reviewed  Disc lifstyle change with low sodium diet and exercise   Plan to continue amlodipine 10 mg daily (causes some swelling)  Losartan 50 mg daily  Will add back chlorthalidone 25 mg daily  Labs today F/u in 1-2 mo for visit and labs

## 2022-05-11 NOTE — Assessment & Plan Note (Signed)
Disc goals for lipids and reasons to control them Rev last labs with pt Rev low sat fat diet in detail  Lab today Atorvastatin 10 mg daily  fenfibrate 134 mg daily  Diet has been sub optimal

## 2022-05-26 ENCOUNTER — Other Ambulatory Visit: Payer: Self-pay | Admitting: Family Medicine

## 2022-05-26 DIAGNOSIS — I10 Essential (primary) hypertension: Secondary | ICD-10-CM

## 2022-06-19 ENCOUNTER — Encounter: Payer: Self-pay | Admitting: Family Medicine

## 2022-06-19 ENCOUNTER — Ambulatory Visit: Payer: BC Managed Care – PPO | Admitting: Family Medicine

## 2022-06-19 VITALS — BP 136/72 | HR 90 | Temp 97.3°F | Ht 73.75 in | Wt 341.1 lb

## 2022-06-19 DIAGNOSIS — E1169 Type 2 diabetes mellitus with other specified complication: Secondary | ICD-10-CM

## 2022-06-19 DIAGNOSIS — E785 Hyperlipidemia, unspecified: Secondary | ICD-10-CM

## 2022-06-19 DIAGNOSIS — I10 Essential (primary) hypertension: Secondary | ICD-10-CM | POA: Diagnosis not present

## 2022-06-19 DIAGNOSIS — E119 Type 2 diabetes mellitus without complications: Secondary | ICD-10-CM

## 2022-06-19 LAB — BASIC METABOLIC PANEL
BUN: 15 mg/dL (ref 6–23)
CO2: 29 mEq/L (ref 19–32)
Calcium: 10 mg/dL (ref 8.4–10.5)
Chloride: 102 mEq/L (ref 96–112)
Creatinine, Ser: 1.09 mg/dL (ref 0.40–1.50)
GFR: 76.55 mL/min (ref >60.00)
Glucose, Bld: 97 mg/dL (ref 70–99)
Potassium: 3.5 mEq/L (ref 3.5–5.1)
Sodium: 137 mEq/L (ref 135–145)

## 2022-06-19 NOTE — Assessment & Plan Note (Signed)
Back on low dose mounjaro  Will likely titrate up at next visit   Discussed how this problem influences overall health and the risks it imposes  Reviewed plan for weight loss with lower calorie diet (via better food choices and also portion control or program like weight watchers) and exercise building up to or more than 30 minutes 5 days per week including some aerobic activity   He has low motivation and hopes this will change

## 2022-06-19 NOTE — Patient Instructions (Addendum)
Take care of yourself   Make a plan to eat better Try to get most of your carbohydrates from produce (with the exception of white potatoes)  Eat less bread/pasta/rice/snack foods/cereals/sweets and other items from the middle of the grocery store (processed carbs)  Make a plan to exercise/bike -work up to 30 minutes per day   BP is better Labs today   See you in December

## 2022-06-19 NOTE — Assessment & Plan Note (Signed)
Disc goals for lipids and reasons to control them Rev last labs with pt Rev low sat fat diet in detail  LDL of 96 with atorvastatin 10 mg daily and fenofibrate 134 mg daily  Diet is sub optimal  If not imp at next labs would consider inc statin for better LDL control

## 2022-06-19 NOTE — Assessment & Plan Note (Signed)
Lab Results  Component Value Date   HGBA1C 6.9 (H) 05/11/2022   Metformin 500 mg bid  mounjaro 2.5 mg weekly - will disc titration up next time to help with wt loss  Eye exam planned for nov  Good foot care microalb is utd  Had flu shot  Working on motivation for low glycemic diet and exercise  F/u in dec

## 2022-06-19 NOTE — Assessment & Plan Note (Addendum)
bp in fair control at this time  BP Readings from Last 1 Encounters:  06/19/22 136/72   No changes needed Most recent labs reviewed  Disc lifstyle change with low sodium diet and exercise  Plan to continue  Amlodipine 10 mg daily  Losartan 50 mg daily  Improved with re start of chlorthalidone 25 mg daily  Will check bmet today for this Trying to get motivation to change lifestyle

## 2022-06-19 NOTE — Progress Notes (Signed)
Subjective:    Patient ID: Charles Watkins, male    DOB: [birth year], 56 y.o.   MRN: [id number]  HPI Pt presents for f/u of HTN and DM2   Wt Readings from Last 3 Encounters:  06/19/22 (!) 341 lb 2 oz (154.7 kg)  05/11/22 (!) 341 lb (154.7 kg)  08/29/21 (!) 329 lb 4 oz (149.3 kg)   44.10 kg/m  Not doing well with diet and exercise  Not motivated   Last visit bp was up off chlorthalidone and with poor eating   BP Readings from Last 3 Encounters:  06/19/22 136/72  05/11/22 (!) 150/82  08/29/21 128/72   Pulse Readings from Last 3 Encounters:  06/19/22 90  05/11/22 84  08/29/21 99    Amlodipine 10 mg daily  Losartan 50 mg daily  Added back chlorthalidone 25 mg daily   Lab Results  Component Value Date   CREATININE 0.96 05/11/2022   BUN 12 05/11/2022   NA 138 05/11/2022   K 4.0 05/11/2022   CL 106 05/11/2022   CO2 24 05/11/2022   DM2 Lab Results  Component Value Date   HGBA1C 6.9 (H) 05/11/2022   Metformin 500 mg bid  Added back monjaro 2.5 mg weekly if able to get  Is back on that  It does help appetite a little ,no side effects   Due for eye exam -has it planned for November   Hyperlipidemia  Lab Results  Component Value Date   CHOL 165 05/11/2022   HDL 46.60 05/11/2022   LDLCALC 96 05/11/2022   LDLDIRECT 117.0 08/10/2016   TRIG 112.0 05/11/2022   CHOLHDL 4 05/11/2022   Atorvastatin 10 mg daily  Fenofibrate 134 mg daily   Plans to start watching diet , will start next week  Wants to ride bike (has both outdoor and indoor)   Patient Active Problem List   Diagnosis Date Noted   Vitamin D deficiency 02/27/2021   Left low back pain 02/27/2021   Colon cancer screening 08/18/2018   Left knee pain 02/10/2018   Controlled type 2 diabetes mellitus without complication, without long-term current use of insulin ([place]) 11/04/2017   H/O motion sickness 11/04/2017   Prostate cancer screening 08/03/2015   Routine general medical examination at a health  care facility 03/22/2013   Morbid obesity ([place]) 09/23/2012   Essential hypertension 08/14/2007   Allergic rhinitis 08/14/2007   BAKER'S CYST 08/14/2007   SLEEP APNEA 08/14/2007   Hyperlipidemia associated with type 2 diabetes mellitus ([place]) 01/16/2007   ADVEF, DRUG/MEDICINAL/BIOLOGICAL SUBST NOS 01/16/2007   Past Medical History:  Diagnosis Date   Allergic rhinitis    Allergy    Arthritis    knee   Diabetes mellitus without complication (HCC)    GERD (gastroesophageal reflux disease)    Hyperglycemia    Hyperlipidemia    Hypertension    Other fatigue    Shortness of breath on exertion    Past Surgical History:  Procedure Laterality Date   Baker's cyst - aspirated  03/2005   KNEE ARTHROSCOPY     WISDOM TOOTH EXTRACTION     Social History   Tobacco Use   Smoking status: Former    Packs/day: 1.00    Years: 24.00    Total pack years: 24.00    Types: Cigarettes    Quit date: 08/06/2011    Years since quitting: 10.8   Smokeless tobacco: Current    Types: Chew   Tobacco comments:    non smoker  Vaping Use   Vaping Use: Never used  Substance Use Topics   Alcohol use: Yes    Alcohol/week: 0.0 standard drinks of alcohol    Comment: drinks on weekend   Drug use: No   Family History  Problem Relation Age of Onset   Heart disease Mother    Thyroid disease Mother    Hypertension Mother    Brain cancer Mother    Stroke Mother    Cancer Mother    Alcoholism Father    Colon cancer Neg Hx    Colon polyps Neg Hx    Esophageal cancer Neg Hx    Rectal cancer Neg Hx    Stomach cancer Neg Hx    Allergies  Allergen Reactions   Lisinopril Cough   Current Outpatient Medications on File Prior to Visit  Medication Sig Dispense Refill   amLODipine (NORVASC) 10 MG tablet Take 1 tablet (10 mg total) by mouth daily. 90 tablet 3   aspirin 81 MG tablet Take 81 mg by mouth daily.     atorvastatin (LIPITOR) 10 MG tablet Take 1 tablet (10 mg total) by mouth every evening. 90 tablet  3   cetirizine (ZYRTEC) 10 MG tablet Take 10 mg by mouth every other day.     chlorthalidone (HYGROTON) 25 MG tablet Take 1 tablet (25 mg total) by mouth daily. 90 tablet 3   esomeprazole (NEXIUM) 20 MG capsule Take 1 capsule (20 mg total) by mouth as needed. 90 capsule 3   fenofibrate micronized (LOFIBRA) 134 MG capsule Take 1 capsule (134 mg total) by mouth daily. 90 capsule 3   losartan (COZAAR) 50 MG tablet Take 1 tablet (50 mg total) by mouth daily. 90 tablet 3   metFORMIN (GLUCOPHAGE) 500 MG tablet Take 1 tablet (500 mg total) by mouth 2 (two) times daily with a meal. 180 tablet 3   Multiple Vitamin (MULTIVITAMIN) tablet Take 1 tablet by mouth daily.     polyethylene glycol powder (GLYCOLAX/MIRALAX) 17 GM/SCOOP powder Take 17 g by mouth daily. 3350 g 0   tirzepatide (MOUNJARO) 2.5 MG/0.5ML Pen Inject 2.5 mg into the skin once a week. 2 mL 3   No current facility-administered medications on file prior to visit.    Review of Systems  Constitutional:  Positive for fatigue. Negative for activity change, appetite change and unexpected weight change.  HENT:  Negative for congestion, rhinorrhea, sore throat and trouble swallowing.   Eyes:  Negative for pain, redness, itching and visual disturbance.  Respiratory:  Negative for cough, chest tightness, shortness of breath and wheezing.   Cardiovascular:  Negative for chest pain and palpitations.  Gastrointestinal:  Negative for abdominal pain, blood in stool, constipation, diarrhea and nausea.  Endocrine: Negative for cold intolerance, heat intolerance, polydipsia and polyuria.  Genitourinary:  Negative for difficulty urinating, dysuria, frequency and urgency.  Musculoskeletal:  Negative for arthralgias, joint swelling and myalgias.  Skin:  Negative for pallor and rash.  Neurological:  Negative for dizziness, tremors, weakness, numbness and headaches.  Hematological:  Negative for adenopathy. Does not bruise/bleed easily.   Psychiatric/Behavioral:  Negative for decreased concentration and dysphoric mood. The patient is not nervous/anxious.        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    Appearance: Normal appearance. He is well-developed. He is obese. He is not ill-appearing or diaphoretic.  HENT:     Head: Normocephalic and atraumatic.  Eyes:     Conjunctiva/sclera: Conjunctivae normal.     Pupils: Pupils are equal, round, and reactive to light.  Neck:     Thyroid: No thyromegaly.     Vascular: No carotid bruit or JVD.  Cardiovascular:     Rate and Rhythm: Normal rate and regular rhythm.     Heart sounds: Normal heart sounds.     No gallop.  Pulmonary:     Effort: Pulmonary effort is normal. No respiratory distress.     Breath sounds: Normal breath sounds. No wheezing or rales.  Abdominal:     General: There is no distension or abdominal bruit.     Palpations: Abdomen is soft.  Musculoskeletal:     Cervical back: Normal range of motion and neck supple.     Right lower leg: No edema.     Left lower leg: No edema.  Lymphadenopathy:     Cervical: No cervical adenopathy.  Skin:    General: Skin is warm and dry.     Coloration: Skin is not pale.     Findings: No rash.  Neurological:     Mental Status: He is alert.     Coordination: Coordination normal.     Deep Tendon Reflexes: Reflexes are normal and symmetric. Reflexes normal.  Psychiatric:        Mood and Affect: Mood normal.           Assessment & Plan:   Problem List Items Addressed This Visit       Cardiovascular and Mediastinum   Essential hypertension - Primary    bp in fair control at this time  BP Readings from Last 1 Encounters:  06/19/22 136/72  No changes needed Most recent labs reviewed  Disc lifstyle change with low sodium diet and exercise  Plan to continue  Amlodipine 10 mg daily  Losartan 50 mg  daily  Improved with re start of chlorthalidone 25 mg daily  Will check bmet today for this Trying to get motivation to change lifestyle       Relevant Orders   Basic metabolic panel     Endocrine   Controlled type 2 diabetes mellitus without complication, without long-term current use of insulin (HCC)    Lab Results  Component Value Date   HGBA1C 6.9 (H) 05/11/2022  Metformin 500 mg bid  mounjaro 2.5 mg weekly - will disc titration up next time to help with wt loss  Eye exam planned for nov  Good foot care microalb is utd  Had flu shot  Working on motivation for low glycemic diet and exercise  F/u in dec       Hyperlipidemia associated with type 2 diabetes mellitus ([place])    Disc goals for lipids and reasons to control them Rev last labs with pt Rev low sat fat diet in detail  LDL of 96 with atorvastatin 10 mg daily and fenofibrate 134 mg daily  Diet is sub optimal  If not imp at next labs would consider inc statin for better LDL control         Other   Morbid obesity ([place])    Back on low dose mounjaro  Will likely titrate up at next visit   Discussed how this problem influences overall health and the risks it imposes  Reviewed plan for weight loss with lower calorie diet (via better food choices and also portion control or program like weight watchers) and exercise  building up to or more than 30 minutes 5 days per week including some aerobic activity   He has low motivation and hopes this will change

## 2022-07-30 ENCOUNTER — Other Ambulatory Visit (HOSPITAL_COMMUNITY): Payer: Self-pay

## 2022-07-30 ENCOUNTER — Telehealth: Payer: Self-pay

## 2022-07-30 NOTE — Telephone Encounter (Signed)
Pharmacy Patient Advocate Encounter   Received notification from [facility] that prior authorization for Mounjaro 2.'5MG'$ /0.5ML pen-injectors is required/requested.   PA submitted on 07/30/22 to [employer] via CoverMyMeds Key  [id number]  Status is pending

## 2022-08-01 ENCOUNTER — Other Ambulatory Visit (HOSPITAL_COMMUNITY): Payer: Self-pay

## 2022-08-01 NOTE — Telephone Encounter (Signed)
PA Previously approved from 09/11/2021 to 09/11/2022 PT MUST FILL AT [place] CANCELLED BY [employer]

## 2022-08-21 ENCOUNTER — Telehealth: Payer: Self-pay | Admitting: Family Medicine

## 2022-08-21 DIAGNOSIS — I10 Essential (primary) hypertension: Secondary | ICD-10-CM

## 2022-08-21 DIAGNOSIS — E119 Type 2 diabetes mellitus without complications: Secondary | ICD-10-CM

## 2022-08-21 DIAGNOSIS — E559 Vitamin D deficiency, unspecified: Secondary | ICD-10-CM

## 2022-08-21 DIAGNOSIS — E785 Hyperlipidemia, unspecified: Secondary | ICD-10-CM

## 2022-08-21 DIAGNOSIS — Z125 Encounter for screening for malignant neoplasm of prostate: Secondary | ICD-10-CM

## 2022-08-21 DIAGNOSIS — E1169 Type 2 diabetes mellitus with other specified complication: Secondary | ICD-10-CM

## 2022-08-21 NOTE — Telephone Encounter (Signed)
-----   Message from Velna Hatchet, RT sent at 08/06/2022 11:39 AM EST ----- Regarding: Wed 12/20 lab Patient is scheduled for cpx, please order future labs.  Thanks, Anda Kraft

## 2022-08-22 ENCOUNTER — Other Ambulatory Visit (INDEPENDENT_AMBULATORY_CARE_PROVIDER_SITE_OTHER): Payer: BC Managed Care – PPO

## 2022-08-22 DIAGNOSIS — E785 Hyperlipidemia, unspecified: Secondary | ICD-10-CM

## 2022-08-22 DIAGNOSIS — E119 Type 2 diabetes mellitus without complications: Secondary | ICD-10-CM | POA: Diagnosis not present

## 2022-08-22 DIAGNOSIS — E559 Vitamin D deficiency, unspecified: Secondary | ICD-10-CM

## 2022-08-22 DIAGNOSIS — Z125 Encounter for screening for malignant neoplasm of prostate: Secondary | ICD-10-CM

## 2022-08-22 DIAGNOSIS — I10 Essential (primary) hypertension: Secondary | ICD-10-CM | POA: Diagnosis not present

## 2022-08-22 DIAGNOSIS — E1169 Type 2 diabetes mellitus with other specified complication: Secondary | ICD-10-CM | POA: Diagnosis not present

## 2022-08-22 LAB — CBC WITH DIFFERENTIAL/PLATELET
Basophils Absolute: 0.1 10*3/uL (ref 0.0–0.1)
Basophils Relative: 1 % (ref 0.0–3.0)
Eosinophils Absolute: 0.2 10*3/uL (ref 0.0–0.7)
Eosinophils Relative: 3.4 % (ref 0.0–5.0)
HCT: 40.7 % (ref 39.0–52.0)
Hemoglobin: 13.6 g/dL (ref 13.0–17.0)
Lymphocytes Relative: 42.8 % (ref 12.0–46.0)
Lymphs Abs: 2.3 10*3/uL (ref 0.7–4.0)
MCHC: 33.3 g/dL (ref 30.0–36.0)
MCV: 89.6 fl (ref 78.0–100.0)
Monocytes Absolute: 0.7 10*3/uL (ref 0.1–1.0)
Monocytes Relative: 13.7 % — ABNORMAL HIGH (ref 3.0–12.0)
Neutro Abs: 2.1 10*3/uL (ref 1.4–7.7)
Neutrophils Relative %: 39.1 % — ABNORMAL LOW (ref 43.0–77.0)
Platelets: 309 10*3/uL (ref 150.0–400.0)
RBC: 4.55 Mil/uL (ref 4.22–5.81)
RDW: 13.8 % (ref 11.5–15.5)
WBC: 5.3 10*3/uL (ref 4.0–10.5)

## 2022-08-22 LAB — COMPREHENSIVE METABOLIC PANEL
ALT: 33 U/L (ref 0–53)
AST: 20 U/L (ref 0–37)
Albumin: 4.1 g/dL (ref 3.5–5.2)
Alkaline Phosphatase: 53 U/L (ref 39–117)
BUN: 16 mg/dL (ref 6–23)
CO2: 30 mEq/L (ref 19–32)
Calcium: 9.7 mg/dL (ref 8.4–10.5)
Chloride: 102 mEq/L (ref 96–112)
Creatinine, Ser: 1.07 mg/dL (ref 0.40–1.50)
GFR: 78.17 mL/min (ref >60.00)
Glucose, Bld: 111 mg/dL — ABNORMAL HIGH (ref 70–99)
Potassium: 3.8 mEq/L (ref 3.5–5.1)
Sodium: 138 mEq/L (ref 135–145)
Total Bilirubin: 0.3 mg/dL (ref 0.2–1.2)
Total Protein: 7.2 g/dL (ref 6.0–8.3)

## 2022-08-22 LAB — PSA: PSA: 0.65 ng/mL (ref 0.10–4.00)

## 2022-08-22 LAB — HEMOGLOBIN A1C: Hgb A1c MFr Bld: 6.6 % — ABNORMAL HIGH (ref 4.6–6.5)

## 2022-08-22 LAB — TSH: TSH: 2.15 u[IU]/mL (ref 0.35–5.50)

## 2022-08-22 LAB — LIPID PANEL
Cholesterol: 119 mg/dL (ref 0–200)
HDL: 34.7 mg/dL — ABNORMAL LOW (ref >39.00)
LDL Cholesterol: 48 mg/dL (ref 0–99)
NonHDL: 84.11
Total CHOL/HDL Ratio: 3
Triglycerides: 182 mg/dL — ABNORMAL HIGH (ref 0.0–149.0)
VLDL: 36.4 mg/dL (ref 0.0–40.0)

## 2022-08-22 LAB — VITAMIN D 25 HYDROXY (VIT D DEFICIENCY, FRACTURES): VITD: 48.48 ng/mL (ref 30.00–100.00)

## 2022-08-29 ENCOUNTER — Ambulatory Visit (INDEPENDENT_AMBULATORY_CARE_PROVIDER_SITE_OTHER): Payer: BC Managed Care – PPO | Admitting: Family Medicine

## 2022-08-29 ENCOUNTER — Encounter: Payer: Self-pay | Admitting: Family Medicine

## 2022-08-29 VITALS — BP 128/78 | HR 92 | Temp 97.4°F | Ht 73.5 in | Wt 340.0 lb

## 2022-08-29 DIAGNOSIS — E1169 Type 2 diabetes mellitus with other specified complication: Secondary | ICD-10-CM

## 2022-08-29 DIAGNOSIS — I10 Essential (primary) hypertension: Secondary | ICD-10-CM | POA: Diagnosis not present

## 2022-08-29 DIAGNOSIS — E785 Hyperlipidemia, unspecified: Secondary | ICD-10-CM

## 2022-08-29 DIAGNOSIS — Z0000 Encounter for general adult medical examination without abnormal findings: Secondary | ICD-10-CM | POA: Diagnosis not present

## 2022-08-29 DIAGNOSIS — E559 Vitamin D deficiency, unspecified: Secondary | ICD-10-CM

## 2022-08-29 DIAGNOSIS — E119 Type 2 diabetes mellitus without complications: Secondary | ICD-10-CM | POA: Diagnosis not present

## 2022-08-29 DIAGNOSIS — M25562 Pain in left knee: Secondary | ICD-10-CM

## 2022-08-29 DIAGNOSIS — Z125 Encounter for screening for malignant neoplasm of prostate: Secondary | ICD-10-CM

## 2022-08-29 DIAGNOSIS — K219 Gastro-esophageal reflux disease without esophagitis: Secondary | ICD-10-CM

## 2022-08-29 MED ORDER — TIRZEPATIDE 2.5 MG/0.5ML ~~LOC~~ SOAJ: 2.5000 mg | SUBCUTANEOUS | 3 refills | Status: DC

## 2022-08-29 NOTE — Assessment & Plan Note (Signed)
Lab Results  Component Value Date   PSA 0.65 08/22/2022   PSA 0.65 08/29/2021   PSA 0.47 08/22/2020     No voiding changes  No fam hx

## 2022-08-29 NOTE — Assessment & Plan Note (Signed)
Reviewed health habits including diet and exercise and skin cancer prevention Reviewed appropriate screening tests for age  Also reviewed health mt list, fam hx and immunization status , as well as social and family history   See HPI Labs reviewed Discussed shingrix vaccine  Colonosocpy utd 2020 with 5 y recall  Sent for recent eye exam  Psa stable Enc wt loss

## 2022-08-29 NOTE — Assessment & Plan Note (Signed)
Discussed how this problem influences overall health and the risks it imposes  Reviewed plan for weight loss with lower calorie diet (via better food choices and also portion control or program like weight watchers) and exercise building up to or more than 30 minutes 5 days per week including some aerobic activity   Will try and get mounjaro filled

## 2022-08-29 NOTE — Progress Notes (Signed)
Subjective:    Patient ID: Charles Watkins, male    DOB: 01-15-1966, 56 y.o.   MRN: [id number]  HPI Here for health maintenance exam and to review chronic medical problems    Wt Readings from Last 3 Encounters:  08/29/22 (!) 340 lb (154.2 kg)  06/19/22 (!) 341 lb 2 oz (154.7 kg)  05/11/22 (!) 341 lb (154.7 kg)   44.25 kg/m  Trying to take care of himself  Exercise- walking for 30 min per day   Trying to eat healthy (holidays are hard)  Tries to stay away from sweets   Left knee problem Thinks it is caused by his work shoes- they may change gait   Immunization History  Administered Date(s) Administered   Influenza Split 08/12/2012   Influenza Whole 08/15/2007, 06/21/2009, 07/06/2010   Influenza,inj,Quad PF,6+ Mos 09/28/2013, 08/09/2015, 08/15/2016, 11/04/2017, 08/18/2018, 05/12/2019, 08/29/2020, 08/29/2021, 05/11/2022   PFIZER(Purple Top)SARS-COV-2 Vaccination 11/11/2019, 12/09/2019, 08/15/2020, 01/07/2021   Pneumococcal Polysaccharide-23 08/18/2018   Td 06/20/2006   Tdap 08/15/2016   Health Maintenance Due  Topic Date Due   Hepatitis C Screening  Never done   Lung Cancer Screening  Never done   Zoster Vaccines- Shingrix (1 of 2) Never done   COVID-19 Vaccine (5 - 2023-24 season) 05/04/2022   OPHTHALMOLOGY EXAM  06/12/2022   Shingrix: unsure if he is interested in it   Eye exam 06/2021 Went in oct and new glasses    Colonoscopy 09/2018 with 5 y recall   Prostate health Lab Results  Component Value Date   PSA 0.65 08/22/2022   PSA 0.65 08/29/2021   PSA 0.47 08/22/2020  No urination changes No nocturia No fam history    HTN bp is stable today  No cp or palpitations or headaches or edema  No side effects to medicines  BP Readings from Last 3 Encounters:  08/29/22 128/78  06/19/22 136/72  05/11/22 (!) 150/82    Pulse Readings from Last 3 Encounters:  08/29/22 92  06/19/22 90  05/11/22 84     Amlodipine 10 mg daily  Losartan 50 mg daily   chlorthalidone 25 mg daily  Will check bmet today for this Trying to get motivation to change lifestyle''  Lab Results  Component Value Date   CREATININE 1.07 08/22/2022   BUN 16 08/22/2022   NA 138 08/22/2022   K 3.8 08/22/2022   CL 102 08/22/2022   CO2 30 08/22/2022     GERD Takes nexium 20 mg as needed  Depends on what he eats  Worse if he eats a lot of fried food or spicy food   DM2 Lab Results  Component Value Date   HGBA1C 6.6 (H) 08/22/2022   This is down from 6.9  Metformin 500 mg bid Mounjaro 2.5 mg weekly   Is trying to avoid sweets  Less bread also and no rice  Pasta once per month    Lab Results  Component Value Date   MICROALBUR 3.0 (H) 05/11/2022   MICROALBUR 2.5 (H) 08/29/2020  Ratio was normal   Vit D def  Level nl at 48.48 with current supplementation     Hyperlipidemia  Lab Results  Component Value Date   CHOL 119 08/22/2022   CHOL 165 05/11/2022   CHOL 146 08/29/2021   Lab Results  Component Value Date   HDL 34.70 (L) 08/22/2022   HDL 46.60 05/11/2022   HDL 39.20 08/29/2021   Lab Results  Component Value Date   LDLCALC 48 08/22/2022  LDLCALC 96 05/11/2022   LDLCALC 75 08/29/2021   Lab Results  Component Value Date   TRIG 182.0 (H) 08/22/2022   TRIG 112.0 05/11/2022   TRIG 157.0 (H) 08/29/2021   Lab Results  Component Value Date   CHOLHDL 3 08/22/2022   CHOLHDL 4 05/11/2022   CHOLHDL 4 08/29/2021   Lab Results  Component Value Date   LDLDIRECT 117.0 08/10/2016   LDLDIRECT 130.7 09/28/2013   LDLDIRECT 136.5 03/12/2012   Atorvastatin 10 mg daily  Fenofibrate 134 mg daily  Improved LDL today  Lab Results  Component Value Date   TSH 2.15 08/22/2022   Lab Results  Component Value Date   WBC 5.3 08/22/2022   HGB 13.6 08/22/2022   HCT 40.7 08/22/2022   MCV 89.6 08/22/2022   PLT 309.0 08/22/2022     Patient Active Problem List   Diagnosis Date Noted   Prostate cancer screening 08/03/2015    Priority:  15.   GERD (gastroesophageal reflux disease) 08/29/2022   Vitamin D deficiency 02/27/2021   Left low back pain 02/27/2021   Colon cancer screening 08/18/2018   Left knee pain 02/10/2018   Controlled type 2 diabetes mellitus without complication, without long-term current use of insulin ([place]) 11/04/2017   H/O motion sickness 11/04/2017   Routine general medical examination at a health care facility 03/22/2013   Morbid obesity ([place]) 09/23/2012   Essential hypertension 08/14/2007   Allergic rhinitis 08/14/2007   BAKER'S CYST 08/14/2007   SLEEP APNEA 08/14/2007   Hyperlipidemia associated with type 2 diabetes mellitus ([place]) 01/16/2007   ADVEF, DRUG/MEDICINAL/BIOLOGICAL SUBST NOS 01/16/2007   Past Medical History:  Diagnosis Date   Allergic rhinitis    Allergy    Arthritis    knee   Diabetes mellitus without complication (HCC)    GERD (gastroesophageal reflux disease)    Hyperglycemia    Hyperlipidemia    Hypertension    Other fatigue    Shortness of breath on exertion    Past Surgical History:  Procedure Laterality Date   Baker's cyst - aspirated  03/2005   KNEE ARTHROSCOPY     WISDOM TOOTH EXTRACTION     Social History   Tobacco Use   Smoking status: Former    Packs/day: 1.00    Years: 24.00    Total pack years: 24.00    Types: Cigarettes    Quit date: 08/06/2011    Years since quitting: 11.0   Smokeless tobacco: Current    Types: Chew   Tobacco comments:    non smoker  Vaping Use   Vaping Use: Never used  Substance Use Topics   Alcohol use: Yes    Alcohol/week: 0.0 standard drinks of alcohol    Comment: drinks on weekend   Drug use: No   Family History  Problem Relation Age of Onset   Heart disease Mother    Thyroid disease Mother    Hypertension Mother    Brain cancer Mother    Stroke Mother    Cancer Mother    Alcoholism Father    Colon cancer Neg Hx    Colon polyps Neg Hx    Esophageal cancer Neg Hx    Rectal cancer Neg Hx    Stomach cancer Neg  Hx    Allergies  Allergen Reactions   Lisinopril Cough   Current Outpatient Medications on File Prior to Visit  Medication Sig Dispense Refill   amLODipine (NORVASC) 10 MG tablet Take 1 tablet (10 mg total) by mouth daily.  90 tablet 3   aspirin 81 MG tablet Take 81 mg by mouth daily.     atorvastatin (LIPITOR) 10 MG tablet Take 1 tablet (10 mg total) by mouth every evening. 90 tablet 3   cetirizine (ZYRTEC) 10 MG tablet Take 10 mg by mouth every other day.     chlorthalidone (HYGROTON) 25 MG tablet Take 1 tablet (25 mg total) by mouth daily. 90 tablet 3   esomeprazole (NEXIUM) 20 MG capsule Take 1 capsule (20 mg total) by mouth as needed. 90 capsule 3   fenofibrate micronized (LOFIBRA) 134 MG capsule Take 1 capsule (134 mg total) by mouth daily. 90 capsule 3   losartan (COZAAR) 50 MG tablet Take 1 tablet (50 mg total) by mouth daily. 90 tablet 3   metFORMIN (GLUCOPHAGE) 500 MG tablet Take 1 tablet (500 mg total) by mouth 2 (two) times daily with a meal. 180 tablet 3   Multiple Vitamin (MULTIVITAMIN) tablet Take 1 tablet by mouth daily.     polyethylene glycol powder (GLYCOLAX/MIRALAX) 17 GM/SCOOP powder Take 17 g by mouth daily. 3350 g 0   No current facility-administered medications on file prior to visit.    Review of Systems  Constitutional:  Negative for activity change, appetite change, fatigue, fever and unexpected weight change.  HENT:  Negative for congestion, rhinorrhea, sore throat and trouble swallowing.   Eyes:  Negative for pain, redness, itching and visual disturbance.  Respiratory:  Negative for cough, chest tightness, shortness of breath and wheezing.   Cardiovascular:  Negative for chest pain and palpitations.  Gastrointestinal:  Negative for abdominal pain, blood in stool, constipation, diarrhea and nausea.  Endocrine: Negative for cold intolerance, heat intolerance, polydipsia and polyuria.  Genitourinary:  Negative for difficulty urinating, dysuria, frequency and  urgency.  Musculoskeletal:  Negative for arthralgias, joint swelling and myalgias.       L knee pain  Foot pain   Skin:  Negative for pallor and rash.  Neurological:  Negative for dizziness, tremors, weakness, numbness and headaches.  Hematological:  Negative for adenopathy. Does not bruise/bleed easily.  Psychiatric/Behavioral:  Negative for decreased concentration and dysphoric mood. The patient is not nervous/anxious.        Objective:   Physical Exam Constitutional:      General: He is not in acute distress.    Appearance: Normal appearance. He is well-developed. He is obese. He is not ill-appearing or diaphoretic.  HENT:     Head: Normocephalic and atraumatic.     Right Ear: Tympanic membrane, ear canal and external ear normal.     Left Ear: Tympanic membrane, ear canal and external ear normal.     Nose: Nose normal. No congestion.     Mouth/Throat:     Mouth: Mucous membranes are moist.     Pharynx: Oropharynx is clear. No posterior oropharyngeal erythema.  Eyes:     General: No scleral icterus.       Right eye: No discharge.        Left eye: No discharge.     Conjunctiva/sclera: Conjunctivae normal.     Pupils: Pupils are equal, round, and reactive to light.  Neck:     Thyroid: No thyromegaly.     Vascular: No carotid bruit or JVD.  Cardiovascular:     Rate and Rhythm: Normal rate and regular rhythm.     Pulses: Normal pulses.     Heart sounds: Normal heart sounds.     No gallop.  Pulmonary:  Effort: Pulmonary effort is normal. No respiratory distress.     Breath sounds: Normal breath sounds. No wheezing or rales.     Comments: Good air exch Chest:     Chest wall: No tenderness.  Abdominal:     General: Bowel sounds are normal. There is no distension or abdominal bruit.     Palpations: Abdomen is soft. There is no mass.     Tenderness: There is no abdominal tenderness.     Hernia: No hernia is present.  Musculoskeletal:        General: No tenderness.      Cervical back: Normal range of motion and neck supple. No rigidity. No muscular tenderness.     Right lower leg: No edema.     Left lower leg: No edema.     Comments: L knee crepitus No tenderness in feet  Lymphadenopathy:     Cervical: No cervical adenopathy.  Skin:    General: Skin is warm and dry.     Coloration: Skin is not pale.     Findings: No erythema or rash.     Comments: Some skin tags  Neurological:     Mental Status: He is alert.     Cranial Nerves: No cranial nerve deficit.     Motor: No abnormal muscle tone.     Coordination: Coordination normal.     Gait: Gait normal.     Deep Tendon Reflexes: Reflexes are normal and symmetric. Reflexes normal.  Psychiatric:        Mood and Affect: Mood normal.        Cognition and Memory: Cognition and memory normal.           Assessment & Plan:   Problem List Items Addressed This Visit       Cardiovascular and Mediastinum   Essential hypertension    bp in fair control at this time  BP Readings from Last 1 Encounters:  08/29/22 128/78  No changes needed Most recent labs reviewed  Disc lifstyle change with low sodium diet and exercise  Plan to continue  Amlodipine 10 mg daily  Losartan 50 mg daily  chlorthalidone 25 mg daily -remains stable with this  Stable renal panel  Trying to get motivation to change lifestyle         Digestive   GERD (gastroesophageal reflux disease)    Nexium 20 mg prn Enc him to avoid foods that flare him        Endocrine   Controlled type 2 diabetes mellitus without complication, without long-term current use of insulin (HCC)    Lab Results  Component Value Date   HGBA1C 6.6 (H) 08/22/2022  Continues metformin 500 mg bid Was unable to get mounjaro 2.5 mg but ins looks like it app to jan 9 Will re send to [facility] and pt will update Hope this would help wt loss Overall working on low glycemic diet Microalb ratio nl  Sent for recent eye exam report  On statin  Nl foot  exam F/u 6 mo      Relevant Medications   tirzepatide [facility]) 2.5 MG/0.5ML Pen   Hyperlipidemia associated with type 2 diabetes mellitus ([place])    Disc goals for lipids and reasons to control them Rev last labs with pt Rev low sat fat diet in detail  LDL is down to 48- pleased with this  Trig up slt   Plan to continue atorvastatin 10 mg daily  Fenofibrate 134 mg daily  Relevant Medications   tirzepatide [facility]) 2.5 MG/0.5ML Pen     Other   Prostate cancer screening    Lab Results  Component Value Date   PSA 0.65 08/22/2022   PSA 0.65 08/29/2021   PSA 0.47 08/22/2020    No voiding changes  No fam hx      Left knee pain    Pt thinks foot problems worsen this  Interested in orthotics for work shoes Will plan f/u with sport med      Morbid obesity ([place])    Discussed how this problem influences overall health and the risks it imposes  Reviewed plan for weight loss with lower calorie diet (via better food choices and also portion control or program like weight watchers) and exercise building up to or more than 30 minutes 5 days per week including some aerobic activity   Will try and get mounjaro filled      Relevant Medications   tirzepatide (MOUNJARO) 2.5 MG/0.5ML Pen   Routine general medical examination at a health care facility - Primary    Reviewed health habits including diet and exercise and skin cancer prevention Reviewed appropriate screening tests for age  Also reviewed health mt list, fam hx and immunization status , as well as social and family history   See HPI Labs reviewed Discussed shingrix vaccine  Colonosocpy utd 2020 with 5 y recall  Sent for recent eye exam  Psa stable Enc wt loss      Vitamin D deficiency    Vitamin D level is therapeutic with current supplementation Disc importance of this to bone and overall health Level of 48      Other Visit Diagnoses     Type 2 diabetes mellitus with other specified complication,  without long-term current use of insulin ([place])       Relevant Medications   tirzepatide (MOUNJARO) 2.5 MG/0.5ML Pen

## 2022-08-29 NOTE — Patient Instructions (Addendum)
I will send your mounjaro to [facility]  Let us know what happens    If you are interested in the shingles vaccine series (Shingrix), call your insurance or pharmacy to check on coverage and location it must be given.  If affordable - you can schedule it here or at your pharmacy depending on coverage   Follow up in 6 months with labs prior   Stop up front to schedule an appt with Dr Lorelei Pont for knee and feet   Take care of yourself

## 2022-08-29 NOTE — Assessment & Plan Note (Signed)
Nexium 20 mg prn Enc him to avoid foods that flare him

## 2022-08-29 NOTE — Assessment & Plan Note (Signed)
Pt thinks foot problems worsen this  Interested in orthotics for work shoes Will plan f/u with sport med

## 2022-08-29 NOTE — Assessment & Plan Note (Signed)
Disc goals for lipids and reasons to control them Rev last labs with pt Rev low sat fat diet in detail  LDL is down to 48- pleased with this  Trig up slt   Plan to continue atorvastatin 10 mg daily  Fenofibrate 134 mg daily

## 2022-08-29 NOTE — Assessment & Plan Note (Signed)
Vitamin D level is therapeutic with current supplementation Disc importance of this to bone and overall health Level of 48

## 2022-08-29 NOTE — Assessment & Plan Note (Signed)
bp in fair control at this time  BP Readings from Last 1 Encounters:  08/29/22 128/78   No changes needed Most recent labs reviewed  Disc lifstyle change with low sodium diet and exercise  Plan to continue  Amlodipine 10 mg daily  Losartan 50 mg daily  chlorthalidone 25 mg daily -remains stable with this  Stable renal panel  Trying to get motivation to change lifestyle

## 2022-08-29 NOTE — Assessment & Plan Note (Signed)
Lab Results  Component Value Date   HGBA1C 6.6 (H) 08/22/2022   Continues metformin 500 mg bid Was unable to get mounjaro 2.5 mg but ins looks like it app to jan 9 Will re send to [facility] and pt will update Hope this would help wt loss Overall working on low glycemic diet Microalb ratio nl  Sent for recent eye exam report  On statin  Nl foot exam F/u 6 mo

## 2022-09-08 NOTE — Progress Notes (Unsigned)
    Rangel Echeverri T. Palmina Clodfelter, MD, [place] at [facility] [place], [address]  Phone: [phone]  FAX: [place] - 57 y.o. male  MRN [id number]  Date of Birth: 10-31-1965  Date: 09/10/2022  PCP: Abner Greenspan, MD  Referral: Abner Greenspan, MD  No chief complaint on file.  Subjective:   Charles Watkins is a 57 y.o. very pleasant male patient with There is no height or weight on file to calculate BMI. who presents with the following:  L knee pain and foot pain.   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    Review of Systems is noted in the HPI, as appropriate  Objective:   There were no vitals taken for this visit.  GEN: No acute distress; alert,appropriate. PULM: Breathing comfortably in no respiratory distress PSYCH: Normally interactive.   Laboratory and Imaging Data:  Assessment and Plan:   ***

## 2022-09-10 ENCOUNTER — Encounter: Payer: Self-pay | Admitting: Family Medicine

## 2022-09-10 ENCOUNTER — Ambulatory Visit: Payer: BC Managed Care – PPO | Admitting: Family Medicine

## 2022-09-10 VITALS — BP 120/64 | HR 102 | Temp 98.9°F | Ht 73.5 in | Wt 340.5 lb

## 2022-09-10 DIAGNOSIS — M1712 Unilateral primary osteoarthritis, left knee: Secondary | ICD-10-CM

## 2022-09-10 DIAGNOSIS — M79672 Pain in left foot: Secondary | ICD-10-CM | POA: Diagnosis not present

## 2022-09-10 DIAGNOSIS — M79671 Pain in right foot: Secondary | ICD-10-CM | POA: Diagnosis not present

## 2022-09-10 DIAGNOSIS — M216X9 Other acquired deformities of unspecified foot: Secondary | ICD-10-CM | POA: Diagnosis not present

## 2022-10-30 ENCOUNTER — Other Ambulatory Visit: Payer: Self-pay | Admitting: Family Medicine

## 2022-10-30 DIAGNOSIS — E1169 Type 2 diabetes mellitus with other specified complication: Secondary | ICD-10-CM

## 2022-10-31 NOTE — Telephone Encounter (Signed)
Last filled on 08/29/22 # 2 mL with 3 refills, f/u scheduled 02/28/23

## 2023-02-07 ENCOUNTER — Other Ambulatory Visit: Payer: Self-pay | Admitting: Family Medicine

## 2023-02-28 ENCOUNTER — Ambulatory Visit: Payer: BC Managed Care – PPO | Admitting: Family Medicine

## 2023-02-28 VITALS — BP 128/79 | HR 96 | Temp 97.9°F | Ht 73.5 in | Wt 338.2 lb

## 2023-02-28 DIAGNOSIS — J301 Allergic rhinitis due to pollen: Secondary | ICD-10-CM

## 2023-02-28 DIAGNOSIS — E119 Type 2 diabetes mellitus without complications: Secondary | ICD-10-CM

## 2023-02-28 DIAGNOSIS — E1169 Type 2 diabetes mellitus with other specified complication: Secondary | ICD-10-CM | POA: Diagnosis not present

## 2023-02-28 DIAGNOSIS — I10 Essential (primary) hypertension: Secondary | ICD-10-CM | POA: Diagnosis not present

## 2023-02-28 DIAGNOSIS — Z7985 Long-term (current) use of injectable non-insulin antidiabetic drugs: Secondary | ICD-10-CM

## 2023-02-28 DIAGNOSIS — Z7984 Long term (current) use of oral hypoglycemic drugs: Secondary | ICD-10-CM

## 2023-02-28 DIAGNOSIS — Z6841 Body Mass Index (BMI) 40.0 and over, adult: Secondary | ICD-10-CM

## 2023-02-28 DIAGNOSIS — E7849 Other hyperlipidemia: Secondary | ICD-10-CM | POA: Diagnosis not present

## 2023-02-28 DIAGNOSIS — E785 Hyperlipidemia, unspecified: Secondary | ICD-10-CM

## 2023-02-28 LAB — COMPREHENSIVE METABOLIC PANEL
ALT: 28 U/L (ref 0–53)
AST: 21 U/L (ref 0–37)
Albumin: 4.3 g/dL (ref 3.5–5.2)
Alkaline Phosphatase: 55 U/L (ref 39–117)
BUN: 12 mg/dL (ref 6–23)
CO2: 30 mEq/L (ref 19–32)
Calcium: 10.4 mg/dL (ref 8.4–10.5)
Chloride: 100 mEq/L (ref 96–112)
Creatinine, Ser: 1.11 mg/dL (ref 0.40–1.50)
GFR: 74.53 mL/min (ref >60.00)
Glucose, Bld: 139 mg/dL — ABNORMAL HIGH (ref 70–99)
Potassium: 3.4 mEq/L — ABNORMAL LOW (ref 3.5–5.1)
Sodium: 137 mEq/L (ref 135–145)
Total Bilirubin: 0.4 mg/dL (ref 0.2–1.2)
Total Protein: 8.2 g/dL (ref 6.0–8.3)

## 2023-02-28 LAB — LIPID PANEL
Cholesterol: 127 mg/dL (ref 0–200)
HDL: 38.7 mg/dL — ABNORMAL LOW (ref >39.00)
LDL Cholesterol: 67 mg/dL (ref 0–99)
NonHDL: 87.94
Total CHOL/HDL Ratio: 3
Triglycerides: 104 mg/dL (ref 0.0–149.0)
VLDL: 20.8 mg/dL (ref 0.0–40.0)

## 2023-02-28 LAB — POCT GLYCOSYLATED HEMOGLOBIN (HGB A1C): Hemoglobin A1C: 6.3 % — AB (ref 4.0–5.6)

## 2023-02-28 MED ORDER — ATORVASTATIN CALCIUM 10 MG PO TABS: 10.0000 mg | ORAL_TABLET | Freq: Every evening | ORAL | 3 refills | Status: DC

## 2023-02-28 MED ORDER — LOSARTAN POTASSIUM 50 MG PO TABS: 50.0000 mg | ORAL_TABLET | Freq: Every day | ORAL | 3 refills | Status: DC

## 2023-02-28 MED ORDER — CHLORTHALIDONE 25 MG PO TABS: 25.0000 mg | ORAL_TABLET | Freq: Every day | ORAL | 3 refills | Status: DC

## 2023-02-28 MED ORDER — FENOFIBRATE MICRONIZED 134 MG PO CAPS: 134.0000 mg | ORAL_CAPSULE | Freq: Every day | ORAL | 3 refills | Status: DC

## 2023-02-28 MED ORDER — METFORMIN HCL 500 MG PO TABS: 500.0000 mg | ORAL_TABLET | Freq: Two times a day (BID) | ORAL | 3 refills | Status: DC

## 2023-02-28 MED ORDER — AMLODIPINE BESYLATE 10 MG PO TABS: 10.0000 mg | ORAL_TABLET | Freq: Every day | ORAL | 3 refills | Status: DC

## 2023-02-28 MED ORDER — TIRZEPATIDE 5 MG/0.5ML ~~LOC~~ SOAJ: 5.0000 mg | SUBCUTANEOUS | 0 refills | Status: DC

## 2023-02-28 NOTE — Patient Instructions (Addendum)
Try to get most of your carbohydrates from produce (with the exception of white potatoes)  Eat less bread/pasta/rice/snack foods/cereals/sweets and other items from the middle of the grocery store (processed carbs)   Aim for 60 oz of water if you can   Keep walking  Start back to the gym doing strength training   Go up on mounjaro from 2.5 to 5 mg weekly  It should start reducing appetite If any side effects let us know  In a month if you doing well (call and update [place]) we will go up on the dose again   Labs today     Schedule annual exam in January with fasting labs prior   For allergies Take zyrtec 10 mg daily  Flonase nasal spray  Every day in season

## 2023-02-28 NOTE — Progress Notes (Signed)
Subjective:    Patient ID: Charles Watkins, male    DOB: [birth year], 57 y.o.   MRN: [id number]  HPI  Wt Readings from Last 3 Encounters:  02/28/23 (!) 338 lb 4 oz (153.4 kg)  09/10/22 (!) 340 lb 8 oz (154.4 kg)  08/29/22 (!) 340 lb (154.2 kg)   44.02 kg/m  Vitals:   02/28/23 0756 02/28/23 0826  BP: (!) 148/82 128/79  Pulse: 96   Temp: 97.9 F (36.6 C)   SpO2: 96%    Pt presents for follow up fof DM,2, HTN , lipids and other chronic medical problems  Allergies worse lately  Forgot to use flonase    Doing well  On vacation this month   Officially retiring next week  Will have more time for self care   Feeling good    HTN bp is stable today  No cp or palpitations or headaches or edema  No side effects to medicines  BP Readings from Last 3 Encounters:  02/28/23 128/79  09/10/22 120/64  08/29/22 128/78    Amlodipine 10 mg daily  Losartan 50 mg daily  chlorthalidone 25 mg daily    Lab Results  Component Value Date   NA 137 02/28/2023   K 3.4 (L) 02/28/2023   CO2 30 02/28/2023   GLUCOSE 139 (H) 02/28/2023   BUN 12 02/28/2023   CREATININE 1.11 02/28/2023   CALCIUM 10.4 02/28/2023   GFR 74.53 02/28/2023   EGFR 86 06/26/2021   GFRNONAA 76.04 06/19/2010   DM2 Lab Results  Component Value Date   HGBA1C 6.3 (A) 02/28/2023   Today 6.3   Metformin 500 mg bid Moujaro 2.5 mg weekly - no side effects   Eating is up and down  Better then he was  Has to keep sweets/ cookies out of the house  Eats veggies Good protein intake   Water intake is fair   Eye exam :06/2022- in [place]   Lab Results  Component Value Date   MICROALBUR 3.0 (H) 05/11/2022   MICROALBUR 2.5 (H) 08/29/2020    Hyperlipidemia Lab Results  Component Value Date   CHOL 127 02/28/2023   HDL 38.70 (L) 02/28/2023   LDLCALC 67 02/28/2023   LDLDIRECT 117.0 08/10/2016   TRIG 104.0 02/28/2023   CHOLHDL 3 02/28/2023   Atorvastatin 10 mg daily  Fenofibrate 134 mg daily    Tries to avoid fatty and fried foods Cooks on grill   A little  Has started walking daily  Will start going to the gym         Patient Active Problem List   Diagnosis Date Noted   Prostate cancer screening 08/03/2015    Priority: 15.   GERD (gastroesophageal reflux disease) 08/29/2022   Vitamin D deficiency 02/27/2021   Left low back pain 02/27/2021   Colon cancer screening 08/18/2018   Left knee pain 02/10/2018   Controlled type 2 diabetes mellitus without complication, without long-term current use of insulin (HCC) 11/04/2017   H/O motion sickness 11/04/2017   Routine general medical examination at a health care facility 03/22/2013   Morbid obesity (HCC) 09/23/2012   Essential hypertension 08/14/2007   Allergic rhinitis 08/14/2007   BAKER'S CYST 08/14/2007   SLEEP APNEA 08/14/2007   Hyperlipidemia associated with type 2 diabetes mellitus (HCC) 01/16/2007   ADVEF, DRUG/MEDICINAL/BIOLOGICAL SUBST NOS 01/16/2007   Past Medical History:  Diagnosis Date   Allergic rhinitis    Allergy    Arthritis    knee   Diabetes  mellitus without complication (HCC)    GERD (gastroesophageal reflux disease)    Hyperglycemia    Hyperlipidemia    Hypertension    Other fatigue    Shortness of breath on exertion    Past Surgical History:  Procedure Laterality Date   Baker's cyst - aspirated  03/2005   KNEE ARTHROSCOPY     WISDOM TOOTH EXTRACTION     Social History   Tobacco Use   Smoking status: Former    Packs/day: 1.00    Years: 24.00    Additional pack years: 0.00    Total pack years: 24.00    Types: Cigarettes    Quit date: 08/06/2011    Years since quitting: 11.5   Smokeless tobacco: Current    Types: Chew   Tobacco comments:    non smoker  Vaping Use   Vaping Use: Never used  Substance Use Topics   Alcohol use: Yes    Alcohol/week: 0.0 standard drinks of alcohol    Comment: drinks on weekend   Drug use: No   Family History  Problem Relation Age of Onset    Heart disease Mother    Thyroid disease Mother    Hypertension Mother    Brain cancer Mother    Stroke Mother    Cancer Mother    Alcoholism Father    Colon cancer Neg Hx    Colon polyps Neg Hx    Esophageal cancer Neg Hx    Rectal cancer Neg Hx    Stomach cancer Neg Hx    Allergies  Allergen Reactions   Lisinopril Cough   Current Outpatient Medications on File Prior to Visit  Medication Sig Dispense Refill   aspirin 81 MG tablet Take 81 mg by mouth daily.     cetirizine (ZYRTEC) 10 MG tablet Take 10 mg by mouth every other day.     esomeprazole (NEXIUM) 20 MG capsule TAKE 1 CAPSULE BY MOUTH ONCE  DAILY AS NEEDED 90 capsule 0   Multiple Vitamin (MULTIVITAMIN) tablet Take 1 tablet by mouth daily.     polyethylene glycol powder (GLYCOLAX/MIRALAX) 17 GM/SCOOP powder Take 17 g by mouth daily. 3350 g 0   No current facility-administered medications on file prior to visit.    Review of Systems  Constitutional:  Negative for activity change, appetite change, fatigue, fever and unexpected weight change.  HENT:  Negative for congestion, rhinorrhea, sore throat and trouble swallowing.   Eyes:  Negative for pain, redness, itching and visual disturbance.  Respiratory:  Negative for cough, chest tightness, shortness of breath and wheezing.   Cardiovascular:  Negative for chest pain and palpitations.  Gastrointestinal:  Negative for abdominal pain, blood in stool, constipation, diarrhea and nausea.  Endocrine: Negative for cold intolerance, heat intolerance, polydipsia and polyuria.  Genitourinary:  Negative for difficulty urinating, dysuria, frequency and urgency.  Musculoskeletal:  Negative for arthralgias, joint swelling and myalgias.  Skin:  Negative for pallor and rash.  Neurological:  Negative for dizziness, tremors, weakness, numbness and headaches.  Hematological:  Negative for adenopathy. Does not bruise/bleed easily.  Psychiatric/Behavioral:  Negative for decreased  concentration and dysphoric mood. The patient is not nervous/anxious.        Objective:   Physical Exam Constitutional:      General: He is not in acute distress.    Appearance: Normal appearance. He is well-developed. He is obese. He is not ill-appearing or diaphoretic.  HENT:     Head: Normocephalic and atraumatic.  Eyes:  Conjunctiva/sclera: Conjunctivae normal.     Pupils: Pupils are equal, round, and reactive to light.  Neck:     Thyroid: No thyromegaly.     Vascular: No carotid bruit or JVD.  Cardiovascular:     Rate and Rhythm: Normal rate and regular rhythm.     Heart sounds: Normal heart sounds.     No gallop.  Pulmonary:     Effort: Pulmonary effort is normal. No respiratory distress.     Breath sounds: Normal breath sounds. No wheezing or rales.  Abdominal:     General: There is no distension or abdominal bruit.     Palpations: Abdomen is soft.  Musculoskeletal:     Cervical back: Normal range of motion and neck supple.     Right lower leg: No edema.     Left lower leg: No edema.  Lymphadenopathy:     Cervical: No cervical adenopathy.  Skin:    General: Skin is warm and dry.     Coloration: Skin is not pale.     Findings: No rash.  Neurological:     Mental Status: He is alert.     Coordination: Coordination normal.     Deep Tendon Reflexes: Reflexes are normal and symmetric. Reflexes normal.  Psychiatric:        Mood and Affect: Mood normal.           Assessment & Plan:   Problem List Items Addressed This Visit       Cardiovascular and Mediastinum   Essential hypertension    bp in fair control at this time  BP Readings from Last 1 Encounters:  02/28/23 128/79  No changes needed Most recent labs reviewed  Disc lifstyle change with low sodium diet and exercise  Plan to continue  Amlodipine 10 mg daily  Losartan 50 mg daily  chlorthalidone 25 mg daily -remains stable with this    Planning better lifestyle with retirement        Relevant Medications   chlorthalidone (HYGROTON) 25 MG tablet   amLODipine (NORVASC) 10 MG tablet   atorvastatin (LIPITOR) 10 MG tablet   fenofibrate micronized (LOFIBRA) 134 MG capsule   losartan (COZAAR) 50 MG tablet     Respiratory   Allergic rhinitis    Encouraged to continue zyrtec 10 mg daily  Add back flonase ns daily          Endocrine   Hyperlipidemia associated with type 2 diabetes mellitus (HCC)    Disc goals for lipids and reasons to control them Rev last labs with pt Rev low sat fat diet in detail  Labs today  Plan to continue atorvastatin 10 mg daily  Fenofibrate 134 mg daily       Relevant Medications   chlorthalidone (HYGROTON) 25 MG tablet   amLODipine (NORVASC) 10 MG tablet   atorvastatin (LIPITOR) 10 MG tablet   fenofibrate micronized (LOFIBRA) 134 MG capsule   losartan (COZAAR) 50 MG tablet   metFORMIN (GLUCOPHAGE) 500 MG tablet   tirzepatide (MOUNJARO) 5 MG/0.5ML Pen   Other Relevant Orders   Comprehensive metabolic panel (Completed)   Lipid panel (Completed)   Controlled type 2 diabetes mellitus without complication, without long-term current use of insulin (HCC) - Primary    Lab Results  Component Value Date   HGBA1C 6.3 (A) 02/28/2023  This is improved Continues metformin 500 mg bid and plan to increase mounjaro to 5 mg weekly as tolerated   Encouraged low glycemic diet  More time for health  habits with retirement  Encouraged to increase water intake  Sent for last eye exam report  Microalb is utd On statin and arb Good foot care       Relevant Medications   atorvastatin (LIPITOR) 10 MG tablet   losartan (COZAAR) 50 MG tablet   metFORMIN (GLUCOPHAGE) 500 MG tablet   tirzepatide (MOUNJARO) 5 MG/0.5ML Pen   Other Relevant Orders   Comprehensive metabolic panel (Completed)     Other   Morbid obesity (HCC)    Discussed how this problem influences overall health and the risks it imposes  Reviewed plan for weight loss with lower  calorie diet (via better food choices and also portion control or program like weight watchers) and exercise building up to or more than 30 minutes 5 days per week including some aerobic activity  Now more motivated with retirement  Encouraged strength building exercise and low glycemic diet  Plan to increase mounjaro dose to 5 mg also for diabetes as tolerated       Relevant Medications   metFORMIN (GLUCOPHAGE) 500 MG tablet   tirzepatide (MOUNJARO) 5 MG/0.5ML Pen   Other Visit Diagnoses     Other hyperlipidemia       Relevant Medications   chlorthalidone (HYGROTON) 25 MG tablet   amLODipine (NORVASC) 10 MG tablet   atorvastatin (LIPITOR) 10 MG tablet   fenofibrate micronized (LOFIBRA) 134 MG capsule   losartan (COZAAR) 50 MG tablet   Type 2 diabetes mellitus with other specified complication, without long-term current use of insulin (HCC)       Relevant Medications   atorvastatin (LIPITOR) 10 MG tablet   losartan (COZAAR) 50 MG tablet   metFORMIN (GLUCOPHAGE) 500 MG tablet   tirzepatide (MOUNJARO) 5 MG/0.5ML Pen   Other Relevant Orders   POCT glycosylated hemoglobin (Hb A1C) (Completed)

## 2023-03-01 MED ORDER — TIRZEPATIDE 5 MG/0.5ML ~~LOC~~ SOAJ: 5.0000 mg | SUBCUTANEOUS | 0 refills | Status: DC

## 2023-03-01 NOTE — Assessment & Plan Note (Signed)
Lab Results  Component Value Date   HGBA1C 6.3 (A) 02/28/2023   This is improved Continues metformin 500 mg bid and plan to increase mounjaro to 5 mg weekly as tolerated   Encouraged low glycemic diet  More time for health habits with retirement  Encouraged to increase water intake  Sent for last eye exam report  Microalb is utd On statin and arb Good foot care

## 2023-03-01 NOTE — Assessment & Plan Note (Signed)
Disc goals for lipids and reasons to control them Rev last labs with pt Rev low sat fat diet in detail  Labs today  Plan to continue atorvastatin 10 mg daily  Fenofibrate 134 mg daily

## 2023-03-01 NOTE — Assessment & Plan Note (Signed)
Discussed how this problem influences overall health and the risks it imposes  Reviewed plan for weight loss with lower calorie diet (via better food choices and also portion control or program like weight watchers) and exercise building up to or more than 30 minutes 5 days per week including some aerobic activity  Now more motivated with retirement  Encouraged strength building exercise and low glycemic diet  Plan to increase mounjaro dose to 5 mg also for diabetes as tolerated

## 2023-03-01 NOTE — Assessment & Plan Note (Signed)
bp in fair control at this time  BP Readings from Last 1 Encounters:  02/28/23 128/79   No changes needed Most recent labs reviewed  Disc lifstyle change with low sodium diet and exercise  Plan to continue  Amlodipine 10 mg daily  Losartan 50 mg daily  chlorthalidone 25 mg daily -remains stable with this    Planning better lifestyle with retirement

## 2023-03-01 NOTE — Assessment & Plan Note (Signed)
Encouraged to continue zyrtec 10 mg daily  Add back flonase ns daily

## 2023-03-04 ENCOUNTER — Telehealth: Payer: Self-pay | Admitting: *Deleted

## 2023-03-04 MED ORDER — POTASSIUM CHLORIDE CRYS ER 10 MEQ PO TBCR: 10.0000 meq | EXTENDED_RELEASE_TABLET | Freq: Two times a day (BID) | ORAL | 1 refills | Status: DC

## 2023-03-04 NOTE — Telephone Encounter (Signed)
-----   Message from Judy Pimple, MD sent at 03/01/2023  8:06 PM EDT ----- Potassium is a little low from the fluid pill you take  Please send in klor con 10 meq and take 1 po every day #30 1 refill Re check bmet in 2 weeks

## 2023-03-04 NOTE — Telephone Encounter (Signed)
Rx sent to pharmacy and lab appt scheduled  

## 2023-03-17 ENCOUNTER — Telehealth: Payer: Self-pay | Admitting: Family Medicine

## 2023-03-17 DIAGNOSIS — I10 Essential (primary) hypertension: Secondary | ICD-10-CM

## 2023-03-17 DIAGNOSIS — E876 Hypokalemia: Secondary | ICD-10-CM | POA: Insufficient documentation

## 2023-03-17 NOTE — Telephone Encounter (Signed)
-----   Message from Lovena Neighbours sent at 03/05/2023 10:19 AM EDT ----- Regarding: 2 week follow up lab orders for 7.15.24 Patient is scheduled for 2 week follow up labs. Thank you, Denny Peon

## 2023-03-18 ENCOUNTER — Other Ambulatory Visit (INDEPENDENT_AMBULATORY_CARE_PROVIDER_SITE_OTHER): Payer: BC Managed Care – PPO

## 2023-03-18 DIAGNOSIS — E876 Hypokalemia: Secondary | ICD-10-CM

## 2023-03-18 DIAGNOSIS — I10 Essential (primary) hypertension: Secondary | ICD-10-CM

## 2023-03-18 LAB — BASIC METABOLIC PANEL
BUN: 14 mg/dL (ref 6–23)
CO2: 29 mEq/L (ref 19–32)
Calcium: 10 mg/dL (ref 8.4–10.5)
Chloride: 101 mEq/L (ref 96–112)
Creatinine, Ser: 1.05 mg/dL (ref 0.40–1.50)
GFR: 79.65 mL/min (ref >60.00)
Glucose, Bld: 169 mg/dL — ABNORMAL HIGH (ref 70–99)
Potassium: 3.5 mEq/L (ref 3.5–5.1)
Sodium: 137 mEq/L (ref 135–145)

## 2023-03-19 MED ORDER — POTASSIUM CHLORIDE CRYS ER 10 MEQ PO TBCR: 10.0000 meq | EXTENDED_RELEASE_TABLET | Freq: Two times a day (BID) | ORAL | 2 refills | Status: DC

## 2023-03-19 NOTE — Addendum Note (Signed)
Addended by: Shon Millet on: 03/19/2023 03:25 PM   Modules accepted: Orders

## 2023-04-05 LAB — HM DIABETES EYE EXAM

## 2023-04-07 ENCOUNTER — Other Ambulatory Visit: Payer: Self-pay | Admitting: Family Medicine

## 2023-04-15 ENCOUNTER — Other Ambulatory Visit: Payer: Self-pay | Admitting: Family Medicine

## 2023-04-24 ENCOUNTER — Telehealth: Payer: Self-pay | Admitting: Pharmacy Technician

## 2023-04-24 ENCOUNTER — Other Ambulatory Visit (HOSPITAL_COMMUNITY): Payer: Self-pay

## 2023-04-24 NOTE — Telephone Encounter (Signed)
Pharmacy Patient Advocate Encounter   Received notification from CoverMyMeds that prior authorization for Mounjaro 5MG /0.5ML pen-injectors is required/requested.   Insurance verification completed.   The patient is insured through [employer] .   Per test claim: PA required; PA submitted to [facility] via CoverMyMeds Key/confirmation #/EOC [id number] Status is pending

## 2023-04-25 NOTE — Addendum Note (Signed)
Addended by: Roxy Manns A on: 04/25/2023 05:11 PM   Modules accepted: Orders

## 2023-04-25 NOTE — Telephone Encounter (Signed)
It looks like mounjaro is not covered but trulicity should be (similar / once weekly injrection) If he wants to try this please send to pharm (pended)  Let me know how he is after 2 weeks and we can increase the dose

## 2023-04-25 NOTE — Telephone Encounter (Signed)
Pharmacy Patient Advocate Encounter  Received notification from [facility] that Prior Authorization for Charles Watkins has been DENIED. Please advise how you'd like to proceed. Full denial letter will be uploaded to the media tab. See denial reason below.   PA #/Case ID/Reference #: [id number]

## 2023-04-29 MED ORDER — TRULICITY 0.75 MG/0.5ML ~~LOC~~ SOAJ: 0.7500 mg | SUBCUTANEOUS | 0 refills | Status: DC

## 2023-04-29 NOTE — Addendum Note (Signed)
Addended by: Shon Millet on: 04/29/2023 10:58 AM   Modules accepted: Orders

## 2023-04-29 NOTE — Telephone Encounter (Signed)
Pt notified of PA denial. He is ok trying Trulicity, Rx sent to pharmacy and pt advise to update [place] in 2 weeks

## 2023-05-04 ENCOUNTER — Other Ambulatory Visit: Payer: Self-pay | Admitting: Family Medicine

## 2023-05-18 ENCOUNTER — Other Ambulatory Visit: Payer: Self-pay | Admitting: Family Medicine

## 2023-05-20 NOTE — Telephone Encounter (Signed)
Does he want to go up on the dose (based on blood glucose and how he is tolerating it)?   Please also schedule follow up in early to mid oct for DM

## 2023-05-20 NOTE — Telephone Encounter (Signed)
Last filled on 04/29/23 #2 mL with 0 refill  (A little early but is being sent to mail order pharmacy)  CPE 09/17/23

## 2023-05-21 NOTE — Telephone Encounter (Signed)
I sent it   Will see him in oct

## 2023-05-21 NOTE — Telephone Encounter (Signed)
Pt said his blood sugars have been doing good and he hasn't had any low blood sugars, pt is okay with getting increased dose sent in.   F/u appt scheduled 06/19/23

## 2023-06-11 ENCOUNTER — Other Ambulatory Visit: Payer: Self-pay | Admitting: Family Medicine

## 2023-06-12 NOTE — Telephone Encounter (Signed)
Last filled on 05/21/23 #2 mL/ 0 refills, DM f/u scheduled for 06/19/23

## 2023-06-12 NOTE — Telephone Encounter (Signed)
How is he doing with the 1.5 mg dose  If well tolerated does he want to increase to 3 mg for blood sugar control and weigh loss

## 2023-06-14 NOTE — Telephone Encounter (Signed)
Left VM requesting pt to call the office back 

## 2023-06-17 NOTE — Telephone Encounter (Signed)
Patient called in returning call he received. Relayed message below. Patient stated that he is doing fine with the 1.5 MG and he would like to increase to 3.0 MG. He stated that 1.5 MG haven't been much with his appetite.

## 2023-06-19 ENCOUNTER — Ambulatory Visit: Payer: BC Managed Care – PPO | Admitting: Family Medicine

## 2023-06-19 VITALS — BP 129/70 | HR 96 | Temp 98.5°F | Ht 73.5 in | Wt 346.5 lb

## 2023-06-19 DIAGNOSIS — Z23 Encounter for immunization: Secondary | ICD-10-CM | POA: Diagnosis not present

## 2023-06-19 DIAGNOSIS — Z7985 Long-term (current) use of injectable non-insulin antidiabetic drugs: Secondary | ICD-10-CM

## 2023-06-19 DIAGNOSIS — I10 Essential (primary) hypertension: Secondary | ICD-10-CM | POA: Diagnosis not present

## 2023-06-19 DIAGNOSIS — E1169 Type 2 diabetes mellitus with other specified complication: Secondary | ICD-10-CM | POA: Diagnosis not present

## 2023-06-19 DIAGNOSIS — E119 Type 2 diabetes mellitus without complications: Secondary | ICD-10-CM

## 2023-06-19 DIAGNOSIS — E785 Hyperlipidemia, unspecified: Secondary | ICD-10-CM

## 2023-06-19 LAB — POCT GLYCOSYLATED HEMOGLOBIN (HGB A1C): Hemoglobin A1C: 6.3 % — AB (ref 4.0–5.6)

## 2023-06-19 LAB — MICROALBUMIN / CREATININE URINE RATIO
Creatinine,U: 238.9 mg/dL
Microalb Creat Ratio: 0.6 mg/g (ref 0.0–30.0)
Microalb, Ur: 1.5 mg/dL (ref 0.0–1.9)

## 2023-06-19 NOTE — Assessment & Plan Note (Signed)
Discussed how this problem influences overall health and the risks it imposes  Reviewed plan for weight loss with lower calorie diet (via better food choices (lower glycemic and portion control) along with exercise building up to or more than 30 minutes 5 days per week including some aerobic activity and strength training    Some weight gained with retirement/ sedentary habits and worse diet  Also change in brand of glp with insurance change  Made some plans and goals today  Also with increase dose of trulicity- may help as well   Making health a priority is going to be difficult for him but hopeful he can do this

## 2023-06-19 NOTE — Assessment & Plan Note (Signed)
Disc goals for lipids and reasons to control them Rev last labs with pt Rev low sat fat diet in detail  Labs today  Plan to continue atorvastatin 10 mg daily  Fenofibrate 134 mg daily

## 2023-06-19 NOTE — Assessment & Plan Note (Signed)
Stable Lab Results  Component Value Date   HGBA1C 6.3 (A) 06/19/2023   Had to change from mounjaro to trulicity due to insurance coverage and now gained some weight  Hopefully with increase to 3 mg in the next 1-2 weeks we will see better appetite control , a month later if well controlled can increase to 4.5 mg  Continues metformin 500 mg bid also  Eye exam utd  Long discussion re: lifestyle change with low glycemic diet and exercise (options noted) , he has done dm teaching also  Microalb today  Follow up planned for annual exam in January

## 2023-06-19 NOTE — Progress Notes (Signed)
Subjective:    Patient ID: Charles Watkins, male    DOB: 11/15/1965, 57 y.o.   MRN: [id number]  HPI  Wt Readings from Last 3 Encounters:  06/19/23 (!) 346 lb 8 oz (157.2 kg)  02/28/23 (!) 338 lb 4 oz (153.4 kg)  09/10/22 (!) 340 lb 8 oz (154.4 kg)   45.10 kg/m  Vitals:   06/19/23 1051 06/19/23 1116  BP: (!) 146/76 129/70  Pulse: 96   Temp: 98.5 F (36.9 C)   SpO2: 95%      Pt presents for follow up of DM2, HTN, lipids and other chronic medical problems  HTN bp is stable today  No cp or palpitations or headaches or edema  No side effects to medicines  BP Readings from Last 3 Encounters:  06/19/23 129/70  02/28/23 128/79  09/10/22 120/64    Amlodipine 10 mg daily  Losartan 50 mg daily  Chlorthalidone 25 mg daily   Lab Results  Component Value Date   NA 137 03/18/2023   K 3.5 03/18/2023   CO2 29 03/18/2023   GLUCOSE 169 (H) 03/18/2023   BUN 14 03/18/2023   CREATININE 1.05 03/18/2023   CALCIUM 10.0 03/18/2023   GFR 79.65 03/18/2023   EGFR 86 06/26/2021   GFRNONAA 76.04 06/19/2010      Dm 2  Lab Results  Component Value Date   HGBA1C 6.3 (A) 06/19/2023   Today -same 6.3   Metformin 500 mg bid  Mounjaro -changed to trulicity due to insurance/cost  Trulicity 3 mg weekly (recently sent in to increase from 1.5)  Will be starting that soon   Feels great   Trulicity has not decreased appetite yet   Eating -good and bad days   Since retirement  Some snacks  Some cereal  Some craving    Has done dm teaching in past    Now retired  Sits and watches TV  Low motivation   Has time to go to the gym      Lab Results  Component Value Date   MICROALBUR 3.0 (H) 05/11/2022   MICROALBUR 2.5 (H) 08/29/2020   Due for micralb ratio    Eye exam 04/2023   Hyperlipidemia Lab Results  Component Value Date   CHOL 127 02/28/2023   HDL 38.70 (L) 02/28/2023   LDLCALC 67 02/28/2023   LDLDIRECT 117.0 08/10/2016   TRIG 104.0 02/28/2023    CHOLHDL 3 02/28/2023   Atorvastatin 10 mg daily  Fenofibrate 134 mg daily    Patient Active Problem List   Diagnosis Date Noted   Prostate cancer screening 08/03/2015    Priority: 15.   Hypokalemia 03/17/2023   GERD (gastroesophageal reflux disease) 08/29/2022   Vitamin D deficiency 02/27/2021   Left low back pain 02/27/2021   Colon cancer screening 08/18/2018   Left knee pain 02/10/2018   Diabetes mellitus treated with injections of non-insulin medication (HCC) 11/04/2017   H/O motion sickness 11/04/2017   Routine general medical examination at a health care facility 03/22/2013   Morbid obesity (HCC) 09/23/2012   Essential hypertension 08/14/2007   Allergic rhinitis 08/14/2007   BAKER'S CYST 08/14/2007   Sleep apnea 08/14/2007   Hyperlipidemia associated with type 2 diabetes mellitus (HCC) 01/16/2007   ADVEF, DRUG/MEDICINAL/BIOLOGICAL SUBST NOS 01/16/2007   Past Medical History:  Diagnosis Date   Allergic rhinitis    Allergy    Arthritis    knee   Diabetes mellitus without complication (HCC)    GERD (gastroesophageal reflux disease)  Hyperglycemia    Hyperlipidemia    Hypertension    Other fatigue    Shortness of breath on exertion    Past Surgical History:  Procedure Laterality Date   Baker's cyst - aspirated  03/2005   KNEE ARTHROSCOPY     WISDOM TOOTH EXTRACTION     Social History   Tobacco Use   Smoking status: Former    Current packs/day: 0.00    Average packs/day: 1 pack/day for 24.0 years (24.0 ttl pk-yrs)    Types: Cigarettes    Start date: 08/06/1987    Quit date: 08/06/2011    Years since quitting: 11.8   Smokeless tobacco: Current    Types: Chew   Tobacco comments:    non smoker  Vaping Use   Vaping status: Never Used  Substance Use Topics   Alcohol use: Yes    Alcohol/week: 0.0 standard drinks of alcohol    Comment: drinks on weekend   Drug use: No   Family History  Problem Relation Age of Onset   Heart disease Mother    Thyroid  disease Mother    Hypertension Mother    Brain cancer Mother    Stroke Mother    Cancer Mother    Alcoholism Father    Colon cancer Neg Hx    Colon polyps Neg Hx    Esophageal cancer Neg Hx    Rectal cancer Neg Hx    Stomach cancer Neg Hx    Allergies  Allergen Reactions   Lisinopril Cough   Current Outpatient Medications on File Prior to Visit  Medication Sig Dispense Refill   amLODipine (NORVASC) 10 MG tablet Take 1 tablet (10 mg total) by mouth daily. 90 tablet 3   aspirin 81 MG tablet Take 81 mg by mouth daily.     atorvastatin (LIPITOR) 10 MG tablet Take 1 tablet (10 mg total) by mouth every evening. 90 tablet 3   cetirizine (ZYRTEC) 10 MG tablet Take 10 mg by mouth every other day.     chlorthalidone (HYGROTON) 25 MG tablet Take 1 tablet (25 mg total) by mouth daily. 90 tablet 3   Dulaglutide (TRULICITY) 3 MG/0.5ML SOPN Inject 3 mg as directed once a week. 2 mL 0   esomeprazole (NEXIUM) 20 MG capsule TAKE 1 CAPSULE BY MOUTH ONCE  DAILY AS NEEDED 90 capsule 1   fenofibrate micronized (LOFIBRA) 134 MG capsule Take 1 capsule (134 mg total) by mouth daily. 90 capsule 3   losartan (COZAAR) 50 MG tablet Take 1 tablet (50 mg total) by mouth daily. 90 tablet 3   metFORMIN (GLUCOPHAGE) 500 MG tablet Take 1 tablet (500 mg total) by mouth 2 (two) times daily with a meal. 180 tablet 3   Multiple Vitamin (MULTIVITAMIN) tablet Take 1 tablet by mouth daily.     potassium chloride (KLOR-CON M) 10 MEQ tablet Take 1 tablet (10 mEq total) by mouth 2 (two) times daily. 180 tablet 2   No current facility-administered medications on file prior to visit.    Review of Systems  Constitutional:  Negative for activity change, appetite change, fatigue, fever and unexpected weight change.       Some lack of motivation    HENT:  Negative for congestion, rhinorrhea, sore throat and trouble swallowing.   Eyes:  Negative for pain, redness, itching and visual disturbance.  Respiratory:  Negative for  cough, chest tightness, shortness of breath and wheezing.   Cardiovascular:  Negative for chest pain and palpitations.  Gastrointestinal:  Negative for abdominal pain, blood in stool, constipation, diarrhea and nausea.  Endocrine: Negative for cold intolerance, heat intolerance, polydipsia and polyuria.  Genitourinary:  Negative for difficulty urinating, dysuria, frequency and urgency.  Musculoskeletal:  Negative for arthralgias, joint swelling and myalgias.  Skin:  Negative for pallor and rash.  Neurological:  Negative for dizziness, tremors, weakness, numbness and headaches.  Hematological:  Negative for adenopathy. Does not bruise/bleed easily.  Psychiatric/Behavioral:  Negative for decreased concentration and dysphoric mood. The patient is not nervous/anxious.        Objective:   Physical Exam Constitutional:      General: He is not in acute distress.    Appearance: Normal appearance. He is well-developed. He is obese. He is not ill-appearing or diaphoretic.  HENT:     Head: Normocephalic and atraumatic.  Eyes:     Conjunctiva/sclera: Conjunctivae normal.     Pupils: Pupils are equal, round, and reactive to light.  Neck:     Thyroid: No thyromegaly.     Vascular: No carotid bruit or JVD.  Cardiovascular:     Rate and Rhythm: Normal rate and regular rhythm.     Heart sounds: Normal heart sounds.     No gallop.  Pulmonary:     Effort: Pulmonary effort is normal. No respiratory distress.     Breath sounds: Normal breath sounds. No wheezing or rales.  Abdominal:     General: There is no distension or abdominal bruit.     Palpations: Abdomen is soft.     Tenderness: There is no abdominal tenderness.  Musculoskeletal:     Cervical back: Normal range of motion and neck supple.     Comments: Trace annkle edema   Lymphadenopathy:     Cervical: No cervical adenopathy.  Skin:    General: Skin is warm and dry.     Coloration: Skin is not pale.     Findings: No rash.   Neurological:     Mental Status: He is alert.     Cranial Nerves: No cranial nerve deficit.     Coordination: Coordination normal.     Deep Tendon Reflexes: Reflexes are normal and symmetric. Reflexes normal.  Psychiatric:        Mood and Affect: Mood normal.           Assessment & Plan:   Problem List Items Addressed This Visit       Cardiovascular and Mediastinum   Essential hypertension    bp in fair control at this time  BP Readings from Last 1 Encounters:  06/19/23 129/70   No changes needed Most recent labs reviewed  Disc lifstyle change with low sodium diet and exercise  Plan to continue  Amlodipine 10 mg daily  Losartan 50 mg daily  chlorthalidone 25 mg daily -remains stable with this    Planning better lifestyle with retirement - so far is sedentary, spent time reviewing options for exercise today        Endocrine   Diabetes mellitus treated with injections of non-insulin medication (HCC)    Stable Lab Results  Component Value Date   HGBA1C 6.3 (A) 06/19/2023   Had to change from mounjaro to trulicity due to insurance coverage and now gained some weight  Hopefully with increase to 3 mg in the next 1-2 weeks we will see better appetite control , a month later if well controlled can increase to 4.5 mg  Continues metformin 500 mg bid also  Eye exam utd  Long discussion  re: lifestyle change with low glycemic diet and exercise (options noted) , he has done dm teaching also  Microalb today  Follow up planned for annual exam in January       Hyperlipidemia associated with type 2 diabetes mellitus (HCC)    Disc goals for lipids and reasons to control them Rev last labs with pt Rev low sat fat diet in detail  Labs today  Plan to continue atorvastatin 10 mg daily  Fenofibrate 134 mg daily         Other   Morbid obesity (HCC)    Discussed how this problem influences overall health and the risks it imposes  Reviewed plan for weight loss with lower  calorie diet (via better food choices (lower glycemic and portion control) along with exercise building up to or more than 30 minutes 5 days per week including some aerobic activity and strength training    Some weight gained with retirement/ sedentary habits and worse diet  Also change in brand of glp with insurance change  Made some plans and goals today  Also with increase dose of trulicity- may help as well   Making health a priority is going to be difficult for him but hopeful he can do this        Other Visit Diagnoses     Type 2 diabetes mellitus with other specified complication, without long-term current use of insulin (HCC)    -  Primary   Relevant Orders   POCT HgB A1C (Completed)   Need for influenza vaccination       Relevant Orders   Flu vaccine trivalent PF, 6mos and older(Flulaval,Afluria,Fluarix,Fluzone) (Completed)

## 2023-06-19 NOTE — Assessment & Plan Note (Signed)
bp in fair control at this time  BP Readings from Last 1 Encounters:  06/19/23 129/70   No changes needed Most recent labs reviewed  Disc lifstyle change with low sodium diet and exercise  Plan to continue  Amlodipine 10 mg daily  Losartan 50 mg daily  chlorthalidone 25 mg daily -remains stable with this    Planning better lifestyle with retirement - so far is sedentary, spent time reviewing options for exercise today

## 2023-06-19 NOTE — Patient Instructions (Addendum)
Try to get most of your carbohydrates from produce (with the exception of white potatoes) and whole grains Eat less bread/pasta/rice/snack foods/cereals/sweets and other items from the middle of the grocery store (processed carbs)   Start some regular exercise  At least 5 days per week   Cardio- walking/ biking , etc -to get heart rate up  Add some strength training to your routine, this is important for bone and brain health and can reduce your risk of falls and help your body use insulin properly and regulate weight  Light weights, exercise bands , and internet videos are a good way to start  Yoga (chair or regular), machines , floor exercises or a gym with machines are also good options    As we continue to increase the trulicity -appetite will go down   When it's time to go up on the Trulicity -increase to 3 mg weekly  After a month if you do well -then we will go up to 4.5 mg (top dose)  Diabetes is well controlled   We will see you for your physical in January   Today- leave a urine sample for the annual protein test

## 2023-07-07 ENCOUNTER — Other Ambulatory Visit: Payer: Self-pay | Admitting: Family Medicine

## 2023-09-08 ENCOUNTER — Telehealth: Payer: Self-pay | Admitting: Family Medicine

## 2023-09-08 DIAGNOSIS — I10 Essential (primary) hypertension: Secondary | ICD-10-CM

## 2023-09-08 DIAGNOSIS — Z7985 Long-term (current) use of injectable non-insulin antidiabetic drugs: Secondary | ICD-10-CM

## 2023-09-08 DIAGNOSIS — E1169 Type 2 diabetes mellitus with other specified complication: Secondary | ICD-10-CM

## 2023-09-08 DIAGNOSIS — E559 Vitamin D deficiency, unspecified: Secondary | ICD-10-CM

## 2023-09-08 DIAGNOSIS — Z125 Encounter for screening for malignant neoplasm of prostate: Secondary | ICD-10-CM

## 2023-09-08 NOTE — Telephone Encounter (Signed)
-----   Message from Alvina Chou sent at 08/29/2023  2:54 PM EST ----- Regarding: Lab order for Tue, 1.7.25 Patient is scheduled for CPX labs, please order future labs, Thanks , Camelia Eng

## 2023-09-10 ENCOUNTER — Other Ambulatory Visit (INDEPENDENT_AMBULATORY_CARE_PROVIDER_SITE_OTHER): Payer: BC Managed Care – PPO

## 2023-09-10 DIAGNOSIS — E785 Hyperlipidemia, unspecified: Secondary | ICD-10-CM

## 2023-09-10 DIAGNOSIS — E119 Type 2 diabetes mellitus without complications: Secondary | ICD-10-CM

## 2023-09-10 DIAGNOSIS — Z125 Encounter for screening for malignant neoplasm of prostate: Secondary | ICD-10-CM

## 2023-09-10 DIAGNOSIS — E559 Vitamin D deficiency, unspecified: Secondary | ICD-10-CM | POA: Diagnosis not present

## 2023-09-10 DIAGNOSIS — E1169 Type 2 diabetes mellitus with other specified complication: Secondary | ICD-10-CM | POA: Diagnosis not present

## 2023-09-10 DIAGNOSIS — I10 Essential (primary) hypertension: Secondary | ICD-10-CM

## 2023-09-10 DIAGNOSIS — Z7985 Long-term (current) use of injectable non-insulin antidiabetic drugs: Secondary | ICD-10-CM | POA: Diagnosis not present

## 2023-09-10 LAB — COMPREHENSIVE METABOLIC PANEL WITH GFR
ALT: 24 U/L (ref 0–53)
AST: 16 U/L (ref 0–37)
Albumin: 4.3 g/dL (ref 3.5–5.2)
Alkaline Phosphatase: 63 U/L (ref 39–117)
BUN: 12 mg/dL (ref 6–23)
CO2: 28 meq/L (ref 19–32)
Calcium: 9.6 mg/dL (ref 8.4–10.5)
Chloride: 103 meq/L (ref 96–112)
Creatinine, Ser: 1.06 mg/dL (ref 0.40–1.50)
GFR: 78.48 mL/min
Glucose, Bld: 141 mg/dL — ABNORMAL HIGH (ref 70–99)
Potassium: 3.5 meq/L (ref 3.5–5.1)
Sodium: 138 meq/L (ref 135–145)
Total Bilirubin: 0.3 mg/dL (ref 0.2–1.2)
Total Protein: 7.5 g/dL (ref 6.0–8.3)

## 2023-09-10 LAB — TSH: TSH: 2.86 u[IU]/mL (ref 0.35–5.50)

## 2023-09-10 LAB — LIPID PANEL
Cholesterol: 133 mg/dL (ref 0–200)
HDL: 40.8 mg/dL (ref >39.00)
LDL Cholesterol: 65 mg/dL (ref 0–99)
NonHDL: 92.34
Total CHOL/HDL Ratio: 3
Triglycerides: 138 mg/dL (ref 0.0–149.0)
VLDL: 27.6 mg/dL (ref 0.0–40.0)

## 2023-09-10 LAB — VITAMIN D 25 HYDROXY (VIT D DEFICIENCY, FRACTURES): VITD: 40.7 ng/mL (ref 30.00–100.00)

## 2023-09-10 LAB — CBC WITH DIFFERENTIAL/PLATELET
Basophils Absolute: 0.1 10*3/uL (ref 0.0–0.1)
Basophils Relative: 1.2 % (ref 0.0–3.0)
Eosinophils Absolute: 0.1 10*3/uL (ref 0.0–0.7)
Eosinophils Relative: 1.8 % (ref 0.0–5.0)
HCT: 40.6 % (ref 39.0–52.0)
Hemoglobin: 13.5 g/dL (ref 13.0–17.0)
Lymphocytes Relative: 51.8 % — ABNORMAL HIGH (ref 12.0–46.0)
Lymphs Abs: 2.6 10*3/uL (ref 0.7–4.0)
MCHC: 33.3 g/dL (ref 30.0–36.0)
MCV: 88.8 fL (ref 78.0–100.0)
Monocytes Absolute: 0.6 10*3/uL (ref 0.1–1.0)
Monocytes Relative: 12.3 % — ABNORMAL HIGH (ref 3.0–12.0)
Neutro Abs: 1.6 10*3/uL (ref 1.4–7.7)
Neutrophils Relative %: 32.9 % — ABNORMAL LOW (ref 43.0–77.0)
Platelets: 313 10*3/uL (ref 150.0–400.0)
RBC: 4.58 Mil/uL (ref 4.22–5.81)
RDW: 14 % (ref 11.5–15.5)
WBC: 5 10*3/uL (ref 4.0–10.5)

## 2023-09-10 LAB — PSA: PSA: 1.12 ng/mL (ref 0.10–4.00)

## 2023-09-10 LAB — HEMOGLOBIN A1C: Hgb A1c MFr Bld: 6.6 % — ABNORMAL HIGH (ref 4.6–6.5)

## 2023-09-17 ENCOUNTER — Encounter: Payer: Self-pay | Admitting: Family Medicine

## 2023-09-17 ENCOUNTER — Ambulatory Visit (INDEPENDENT_AMBULATORY_CARE_PROVIDER_SITE_OTHER): Payer: BC Managed Care – PPO | Admitting: Family Medicine

## 2023-09-17 VITALS — BP 126/70 | HR 105 | Temp 98.4°F | Ht 73.5 in | Wt 343.4 lb

## 2023-09-17 DIAGNOSIS — Z0000 Encounter for general adult medical examination without abnormal findings: Secondary | ICD-10-CM

## 2023-09-17 DIAGNOSIS — Z125 Encounter for screening for malignant neoplasm of prostate: Secondary | ICD-10-CM

## 2023-09-17 DIAGNOSIS — Z1159 Encounter for screening for other viral diseases: Secondary | ICD-10-CM | POA: Insufficient documentation

## 2023-09-17 DIAGNOSIS — I10 Essential (primary) hypertension: Secondary | ICD-10-CM | POA: Diagnosis not present

## 2023-09-17 DIAGNOSIS — E785 Hyperlipidemia, unspecified: Secondary | ICD-10-CM

## 2023-09-17 DIAGNOSIS — K219 Gastro-esophageal reflux disease without esophagitis: Secondary | ICD-10-CM

## 2023-09-17 DIAGNOSIS — E1169 Type 2 diabetes mellitus with other specified complication: Secondary | ICD-10-CM

## 2023-09-17 DIAGNOSIS — E559 Vitamin D deficiency, unspecified: Secondary | ICD-10-CM | POA: Diagnosis not present

## 2023-09-17 DIAGNOSIS — Z1211 Encounter for screening for malignant neoplasm of colon: Secondary | ICD-10-CM

## 2023-09-17 DIAGNOSIS — Z7985 Long-term (current) use of injectable non-insulin antidiabetic drugs: Secondary | ICD-10-CM

## 2023-09-17 DIAGNOSIS — G4733 Obstructive sleep apnea (adult) (pediatric): Secondary | ICD-10-CM

## 2023-09-17 DIAGNOSIS — Z114 Encounter for screening for human immunodeficiency virus [HIV]: Secondary | ICD-10-CM | POA: Insufficient documentation

## 2023-09-17 MED ORDER — TRULICITY 4.5 MG/0.5ML ~~LOC~~ SOAJ: 4.5000 mg | SUBCUTANEOUS | 2 refills | Status: DC

## 2023-09-17 NOTE — Assessment & Plan Note (Signed)
 Last vitamin D Lab Results  Component Value Date   VD25OH 40.70 09/10/2023   Vitamin D level is therapeutic with current supplementation Disc importance of this to bone and overall health

## 2023-09-17 NOTE — Assessment & Plan Note (Signed)
 Per pt did not qualify for cpap

## 2023-09-17 NOTE — Progress Notes (Signed)
 Subjective:    Patient ID: Charles Watkins, male    DOB: [birth year], 58 y.o.   MRN: [id number]  HPI  Here for health maintenance exam and to review chronic medical problems   Wt Readings from Last 3 Encounters:  09/17/23 (!) 343 lb 6 oz (155.8 kg)  06/19/23 (!) 346 lb 8 oz (157.2 kg)  02/28/23 (!) 338 lb 4 oz (153.4 kg)   44.69 kg/m  Vitals:   09/17/23 0759 09/17/23 0827  BP: 134/82 126/70  Pulse: (!) 105   Temp: 98.4 F (36.9 C)   SpO2: 95%     Immunization History  Administered Date(s) Administered   Influenza Split 08/12/2012   Influenza Whole 08/15/2007, 06/21/2009, 07/06/2010   Influenza, Seasonal, Injecte, Preservative Fre 06/19/2023   Influenza,inj,Quad PF,6+ Mos 09/28/2013, 08/09/2015, 08/15/2016, 11/04/2017, 08/18/2018, 05/12/2019, 08/29/2020, 08/29/2021, 05/11/2022   PFIZER(Purple Top)SARS-COV-2 Vaccination 11/11/2019, 12/09/2019, 08/15/2020, 01/07/2021   Pneumococcal Polysaccharide-23 08/18/2018   Td 06/20/2006   Tdap 08/15/2016    Health Maintenance Due  Topic Date Due   Hepatitis C Screening  Never done   Lung Cancer Screening  Never done   Pneumococcal Vaccine 70-26 Years old (2 of 2 - PCV) 08/19/2019   FOOT EXAM  08/30/2023   Colonoscopy  09/30/2023   Some traveling  Holidays   Pna vaccine -had 2019   Shingrix vaccine -may be interested   Hep C screening -interested in this and HIV screening   Lung cancer screening -wants to learn more    Prostate health Lab Results  Component Value Date   PSA 1.12 09/10/2023   PSA 0.65 08/22/2022   PSA 0.65 08/29/2021  No family history  No changes in voiding  Only has nocturia if he drinks a lot of fluid before bed/not often (less than once per week)   Colon cancer screening -colonoscopy 09/2018 - 5 y   Bone health   Falls- none Fractures-none  Supplements - vitamin D  Last vitamin D  Lab Results  Component Value Date   VD25OH 40.70 09/10/2023    Exercise  Walking 30 minutes     Mood    09/17/2023    8:15 AM 06/19/2023   11:26 AM 02/28/2023    8:08 AM 08/29/2022    9:03 AM 08/29/2021    9:06 AM  Depression screen PHQ 2/9  Decreased Interest 0 0 0 0 0  Down, Depressed, Hopeless 0 0 0 0 0  PHQ - 2 Score 0 0 0 0 0  Altered sleeping 0 0 0  0  Tired, decreased energy 0 0 0  0  Change in appetite 1 1 1   0  Feeling bad or failure about yourself  0 0 0  0  Trouble concentrating 0 0 0  0  Moving slowly or fidgety/restless 0 0 0  0  Suicidal thoughts 0 0 0  0  PHQ-9 Score 1 1 1   0  Difficult doing work/chores Not difficult at all Not difficult at all Not difficult at all  Not difficult at all   HTN bp is stable today  No cp or palpitations or headaches or edema  No side effects to medicines  BP Readings from Last 3 Encounters:  09/17/23 126/70  06/19/23 129/70  02/28/23 128/79     Amlodipine  10 mg daily  Losartan  50 mg daily  Chlorthalidone  25 mg daily   Lab Results  Component Value Date   NA 138 09/10/2023   K 3.5 09/10/2023   CO2 28 09/10/2023  GLUCOSE 141 (H) 09/10/2023   BUN 12 09/10/2023   CREATININE 1.06 09/10/2023   CALCIUM  9.6 09/10/2023   GFR 78.48 09/10/2023   EGFR 86 06/26/2021   GFRNONAA 76.04 06/19/2010      GERD Nexium  20 mg daily  Lab Results  Component Value Date   VITAMINB12 637 12/01/2020   Has to be more careful with what he eats  Onion    DM2 Lab Results  Component Value Date   HGBA1C 6.6 (H) 09/10/2023   HGBA1C 6.3 (A) 06/19/2023   HGBA1C 6.3 (A) 02/28/2023   Up from last time  Had to change from mounjaro  to trulicity  due to insurance coverage  Taking 3 mg weekly  Metformin  500 mg bid   Trying to eat the right things  Avoids pasta  No bread  Some rice if he eats out -not often  Tries not to buy cookies  Avoids snack foods  Some white potatoes       Hyperlipidemia Lab Results  Component Value Date   CHOL 133 09/10/2023   CHOL 127 02/28/2023   CHOL 119 08/22/2022   Lab Results   Component Value Date   HDL 40.80 09/10/2023   HDL 38.70 (L) 02/28/2023   HDL 34.70 (L) 08/22/2022   Lab Results  Component Value Date   LDLCALC 65 09/10/2023   LDLCALC 67 02/28/2023   LDLCALC 48 08/22/2022   Lab Results  Component Value Date   TRIG 138.0 09/10/2023   TRIG 104.0 02/28/2023   TRIG 182.0 (H) 08/22/2022   Lab Results  Component Value Date   CHOLHDL 3 09/10/2023   CHOLHDL 3 02/28/2023   CHOLHDL 3 08/22/2022   Lab Results  Component Value Date   LDLDIRECT 117.0 08/10/2016   LDLDIRECT 130.7 09/28/2013   LDLDIRECT 136.5 03/12/2012   Atorvastatin  10 mg daily  Fenofibrate  134 mg daily  Lab Results  Component Value Date   ALT 24 09/10/2023   AST 16 09/10/2023   ALKPHOS 63 09/10/2023   BILITOT 0.3 09/10/2023    Lab Results  Component Value Date   WBC 5.0 09/10/2023   HGB 13.5 09/10/2023   HCT 40.6 09/10/2023   MCV 88.8 09/10/2023   PLT 313.0 09/10/2023   Lab Results  Component Value Date   TSH 2.86 09/10/2023      Patient Active Problem List   Diagnosis Date Noted   Prostate cancer screening 08/03/2015    Priority: 15.   Encounter for hepatitis C screening test for low risk patient 09/17/2023   Encounter for screening for HIV 09/17/2023   GERD (gastroesophageal reflux disease) 08/29/2022   Vitamin D  deficiency 02/27/2021   Colon cancer screening 08/18/2018   Left knee pain 02/10/2018   Diabetes mellitus treated with injections of non-insulin  medication (HCC) 11/04/2017   H/O motion sickness 11/04/2017   Routine general medical examination at a health care facility 03/22/2013   Morbid obesity (HCC) 09/23/2012   Essential hypertension 08/14/2007   Allergic rhinitis 08/14/2007   BAKER'S CYST 08/14/2007   Sleep apnea 08/14/2007   Hyperlipidemia associated with type 2 diabetes mellitus (HCC) 01/16/2007   Past Medical History:  Diagnosis Date   Allergic rhinitis    Allergy    Arthritis    knee   Diabetes mellitus without complication  (HCC)    GERD (gastroesophageal reflux disease)    Hyperglycemia    Hyperlipidemia    Hypertension    Other fatigue    Shortness of breath on exertion    Past  Surgical History:  Procedure Laterality Date   Baker's cyst - aspirated  03/2005   KNEE ARTHROSCOPY     WISDOM TOOTH EXTRACTION     Social History   Tobacco Use   Smoking status: Former    Current packs/day: 0.00    Average packs/day: 1 pack/day for 24.0 years (24.0 ttl pk-yrs)    Types: Cigarettes    Start date: 08/06/1987    Quit date: 08/06/2011    Years since quitting: 12.1   Smokeless tobacco: Current    Types: Chew   Tobacco comments:    non smoker  Vaping Use   Vaping status: Never Used  Substance Use Topics   Alcohol use: Yes    Alcohol/week: 0.0 standard drinks of alcohol    Comment: drinks on weekend   Drug use: No   Family History  Problem Relation Age of Onset   Heart disease Mother    Thyroid  disease Mother    Hypertension Mother    Brain cancer Mother    Stroke Mother    Cancer Mother    Alcoholism Father    Colon cancer Neg Hx    Colon polyps Neg Hx    Esophageal cancer Neg Hx    Rectal cancer Neg Hx    Stomach cancer Neg Hx    Allergies  Allergen Reactions   Lisinopril  Cough   Current Outpatient Medications on File Prior to Visit  Medication Sig Dispense Refill   amLODipine  (NORVASC ) 10 MG tablet Take 1 tablet (10 mg total) by mouth daily. 90 tablet 3   aspirin 81 MG tablet Take 81 mg by mouth daily.     atorvastatin  (LIPITOR) 10 MG tablet Take 1 tablet (10 mg total) by mouth every evening. 90 tablet 3   cetirizine (ZYRTEC) 10 MG tablet Take 10 mg by mouth every other day.     chlorthalidone  (HYGROTON ) 25 MG tablet Take 1 tablet (25 mg total) by mouth daily. 90 tablet 3   esomeprazole  (NEXIUM ) 20 MG capsule TAKE 1 CAPSULE BY MOUTH ONCE  DAILY AS NEEDED 90 capsule 1   fenofibrate  micronized (LOFIBRA) 134 MG capsule Take 1 capsule (134 mg total) by mouth daily. 90 capsule 3   losartan   (COZAAR ) 50 MG tablet Take 1 tablet (50 mg total) by mouth daily. 90 tablet 3   metFORMIN  (GLUCOPHAGE ) 500 MG tablet Take 1 tablet (500 mg total) by mouth 2 (two) times daily with a meal. 180 tablet 3   Multiple Vitamin (MULTIVITAMIN) tablet Take 1 tablet by mouth daily.     potassium chloride  (KLOR-CON  M) 10 MEQ tablet Take 1 tablet (10 mEq total) by mouth 2 (two) times daily. 180 tablet 2   No current facility-administered medications on file prior to visit.    Review of Systems  Constitutional:  Negative for activity change, appetite change, fatigue, fever and unexpected weight change.  HENT:  Negative for congestion, rhinorrhea, sore throat and trouble swallowing.   Eyes:  Negative for pain, redness, itching and visual disturbance.  Respiratory:  Negative for cough, chest tightness, shortness of breath and wheezing.   Cardiovascular:  Negative for chest pain and palpitations.  Gastrointestinal:  Negative for abdominal pain, blood in stool, constipation, diarrhea and nausea.  Endocrine: Negative for cold intolerance, heat intolerance, polydipsia and polyuria.  Genitourinary:  Negative for difficulty urinating, dysuria, frequency and urgency.  Musculoskeletal:  Positive for arthralgias. Negative for joint swelling and myalgias.  Skin:  Negative for pallor and rash.  Neurological:  Negative  for dizziness, tremors, weakness, numbness and headaches.  Hematological:  Negative for adenopathy. Does not bruise/bleed easily.  Psychiatric/Behavioral:  Negative for decreased concentration and dysphoric mood. The patient is not nervous/anxious.        Objective:   Physical Exam Constitutional:      General: He is not in acute distress.    Appearance: Normal appearance. He is well-developed. He is obese. He is not ill-appearing or diaphoretic.  HENT:     Head: Normocephalic and atraumatic.     Right Ear: Tympanic membrane, ear canal and external ear normal.     Left Ear: Tympanic membrane, ear  canal and external ear normal.     Nose: Nose normal. No congestion.     Mouth/Throat:     Mouth: Mucous membranes are moist.     Pharynx: Oropharynx is clear. No posterior oropharyngeal erythema.  Eyes:     General: No scleral icterus.       Right eye: No discharge.        Left eye: No discharge.     Conjunctiva/sclera: Conjunctivae normal.     Pupils: Pupils are equal, round, and reactive to light.  Neck:     Thyroid : No thyromegaly.     Vascular: No carotid bruit or JVD.  Cardiovascular:     Rate and Rhythm: Normal rate and regular rhythm.     Pulses: Normal pulses.     Heart sounds: Normal heart sounds.     No gallop.  Pulmonary:     Effort: Pulmonary effort is normal. No respiratory distress.     Breath sounds: Normal breath sounds. No wheezing or rales.     Comments: Good air exch Chest:     Chest wall: No tenderness.  Abdominal:     General: Bowel sounds are normal. There is no distension or abdominal bruit.     Palpations: Abdomen is soft. There is no mass.     Tenderness: There is no abdominal tenderness.     Hernia: No hernia is present.  Musculoskeletal:        General: No tenderness.     Cervical back: Normal range of motion and neck supple. No rigidity. No muscular tenderness.     Right lower leg: No edema.     Left lower leg: No edema.  Lymphadenopathy:     Cervical: No cervical adenopathy.  Skin:    General: Skin is warm and dry.     Coloration: Skin is not pale.     Findings: No erythema or rash.     Comments: Scattered skin tags   Neurological:     Mental Status: He is alert.     Cranial Nerves: No cranial nerve deficit.     Motor: No abnormal muscle tone.     Coordination: Coordination normal.     Gait: Gait normal.     Deep Tendon Reflexes: Reflexes are normal and symmetric. Reflexes normal.  Psychiatric:        Mood and Affect: Mood normal.        Cognition and Memory: Cognition and memory normal.           Assessment & Plan:   Problem  List Items Addressed This Visit       Cardiovascular and Mediastinum   Essential hypertension   bp in fair control at this time  BP Readings from Last 1 Encounters:  09/17/23 126/70   No changes needed Most recent labs reviewed  Disc lifstyle change with low sodium  diet and exercise  Plan to continue  Amlodipine  10 mg daily  Losartan  50 mg daily  chlorthalidone  25 mg daily -remains stable with this   Encouraged a more active lifestyle         Respiratory   Sleep apnea   Per pt did not qualify for cpap        Digestive   GERD (gastroesophageal reflux disease)   Continues nexium  20 mg daily  B12 level is in normal range Encouraged to avoid triggers in diet and work on weight loss         Endocrine   Hyperlipidemia associated with type 2 diabetes mellitus (HCC)   Disc goals for lipids and reasons to control them Rev last labs with pt Rev low sat fat diet in detail  LDL of 65 HDL imporved   Plan to continue atorvastatin  10 mg daily  Fenofibrate  134 mg daily       Relevant Medications   Dulaglutide  (TRULICITY ) 4.5 MG/0.5ML SOAJ   Diabetes mellitus treated with injections of non-insulin  medication (HCC)   Lab Results  Component Value Date   HGBA1C 6.6 (H) 09/10/2023   HGBA1C 6.3 (A) 06/19/2023   HGBA1C 6.3 (A) 02/28/2023   Continues metformin  500 mg bid  Will increase trulicity  to 4.5 mg weekly and follow up 3 mo  Microalb utd  Eating better and less Continues statin  Normal foot exam       Relevant Medications   Dulaglutide  (TRULICITY ) 4.5 MG/0.5ML SOAJ     Other   Prostate cancer screening   Lab Results  Component Value Date   PSA 1.12 09/10/2023   PSA 0.65 08/22/2022   PSA 0.65 08/29/2021    No family history  No voiding changes        Vitamin D  deficiency   Last vitamin D  Lab Results  Component Value Date   VD25OH 40.70 09/10/2023   Vitamin D  level is therapeutic with current supplementation Disc importance of this to bone and  overall health       Routine general medical examination at a health care facility - Primary   Reviewed health habits including diet and exercise and skin cancer prevention Reviewed appropriate screening tests for age  Also reviewed health mt list, fam hx and immunization status , as well as social and family history   See HPI Labs reviewed and ordered Encouraged pt to check on coverage of shingrix vaccine Hep C and HIV screening today  Given info on lung cancer screening program with CT to read- will discuss later at his follow up  Reviewed psa Colonoscopy is due for 5 y recall Discussed fall prevention, supplements and exercise for bone density  Encouraged more exercise now that he is retired  [facility] Health Maintenance  Topic Date Due   Hepatitis C Screening  Never done   Screening for Lung Cancer  Never done   Pneumococcal Vaccination (2 of 2 - PCV) 08/19/2019   Complete foot exam   08/30/2023   Colon Cancer Screening  09/30/2023   Zoster (Shingles) Vaccine (1 of 2) 09/19/2023*   COVID-19 Vaccine (5 - 2024-25 season) 07/04/2025*   Hemoglobin A1C  03/09/2024   Eye exam for diabetics  04/04/2024   Yearly kidney health urinalysis for diabetes  06/18/2024   Yearly kidney function blood test for diabetes  09/09/2024   DTaP/Tdap/Td vaccine (3 - Td or Tdap) 08/15/2026   Flu Shot  Completed   HIV Screening  Completed   HPV  Vaccine  Aged Out  *Topic was postponed. The date shown is not the original due date.         Morbid obesity (HCC)   Discussed how this problem influences overall health and the risks it imposes  Reviewed plan for weight loss with lower calorie diet (via better food choices (lower glycemic and portion control) along with exercise building up to or more than 30 minutes 5 days per week including some aerobic activity and strength training   Plan to increase trulicity  dose today as well       Relevant Medications   Dulaglutide  (TRULICITY ) 4.5 MG/0.5ML SOAJ    Encounter for screening for HIV   HIV screen today  Low risk      Relevant Orders   HIV antibody (with reflex)   Encounter for hepatitis C screening test for low risk patient   Hep C screen today      Relevant Orders   Hepatitis C Antibody   Colon cancer screening   Colonoscopy 09/2018 with 5 y recall  Due for this       Other Visit Diagnoses       Type 2 diabetes mellitus with other specified complication, without long-term current use of insulin  (HCC)       Relevant Medications   Dulaglutide  (TRULICITY ) 4.5 MG/0.5ML SOAJ

## 2023-09-17 NOTE — Assessment & Plan Note (Signed)
 Reviewed health habits including diet and exercise and skin cancer prevention Reviewed appropriate screening tests for age  Also reviewed health mt list, fam hx and immunization status , as well as social and family history   See HPI Labs reviewed and ordered Encouraged pt to check on coverage of shingrix vaccine Hep C and HIV screening today  Given info on lung cancer screening program with CT to read- will discuss later at his follow up  Reviewed psa Colonoscopy is due for 5 y recall Discussed fall prevention, supplements and exercise for bone density  Encouraged more exercise now that he is retired  [facility] 1 Health Maintenance  Topic Date Due   Hepatitis C Screening  Never done   Screening for Lung Cancer  Never done   Pneumococcal Vaccination (2 of 2 - PCV) 08/19/2019   Complete foot exam   08/30/2023   Colon Cancer Screening  09/30/2023   Zoster (Shingles) Vaccine (1 of 2) 09/19/2023*   COVID-19 Vaccine (5 - 2024-25 season) 07/04/2025*   Hemoglobin A1C  03/09/2024   Eye exam for diabetics  04/04/2024   Yearly kidney health urinalysis for diabetes  06/18/2024   Yearly kidney function blood test for diabetes  09/09/2024   DTaP/Tdap/Td vaccine (3 - Td or Tdap) 08/15/2026   Flu Shot  Completed   HIV Screening  Completed   HPV Vaccine  Aged Out  *Topic was postponed. The date shown is not the original due date.

## 2023-09-17 NOTE — Assessment & Plan Note (Signed)
 bp in fair control at this time  BP Readings from Last 1 Encounters:  09/17/23 126/70   No changes needed Most recent labs reviewed  Disc lifstyle change with low sodium diet and exercise  Plan to continue  Amlodipine  10 mg daily  Losartan  50 mg daily  chlorthalidone  25 mg daily -remains stable with this   Encouraged a more active lifestyle

## 2023-09-17 NOTE — Assessment & Plan Note (Signed)
 Lab Results  Component Value Date   PSA 1.12 09/10/2023   PSA 0.65 08/22/2022   PSA 0.65 08/29/2021    No family history  No voiding changes

## 2023-09-17 NOTE — Assessment & Plan Note (Signed)
 Colonoscopy 09/2018 with 5 y recall  Due for this

## 2023-09-17 NOTE — Assessment & Plan Note (Signed)
 Continues nexium 20 mg daily  B12 level is in normal range Encouraged to avoid triggers in diet and work on weight loss

## 2023-09-17 NOTE — Assessment & Plan Note (Signed)
 HIV screen today Low risk

## 2023-09-17 NOTE — Assessment & Plan Note (Signed)
 Disc goals for lipids and reasons to control them Rev last labs with pt Rev low sat fat diet in detail  LDL of 65 HDL imporved   Plan to continue atorvastatin 10 mg daily  Fenofibrate 134 mg daily

## 2023-09-17 NOTE — Patient Instructions (Addendum)
 Take a look at the handout on lung cancer screening with CT scans - tell us  if you are interested   If you are interested in the shingles vaccine series (Shingrix), call your insurance or pharmacy to check on coverage and location it must be given.  If affordable - you can schedule it here or at your pharmacy depending on coverage   Keep walking  Add some strength training to your routine, this is important for bone and brain health and can reduce your risk of falls and help your body use insulin  properly and regulate weight  Light weights, exercise bands , and internet videos are a good way to start  Yoga (chair or regular), machines , floor exercises or a gym with machines are also good options    Stay away from any triggers of acid reflux like onions   For diabetes  Try to get most of your carbohydrates from produce (with the exception of white potatoes) and whole grains Eat less bread/pasta/rice/snack foods/cereals/sweets and other items from the middle of the grocery store (processed carbs) Sweet potato is better than white potato   Make sure to eat lean protein  The following are examples of protein in diet  Meat  Fish  Eggs  Dairy products  Soy products  Oat milk  Almond milk Nuts and nut butters  Dried beans     Go up on trulicity  to 4.5 mg weekly  If you don't tolerate it or have any problems let me know  Follow up in 3 months   Labs for HIV and hep C screening today

## 2023-09-17 NOTE — Assessment & Plan Note (Signed)
 Discussed how this problem influences overall health and the risks it imposes  Reviewed plan for weight loss with lower calorie diet (via better food choices (lower glycemic and portion control) along with exercise building up to or more than 30 minutes 5 days per week including some aerobic activity and strength training   Plan to increase trulicity  dose today as well

## 2023-09-17 NOTE — Assessment & Plan Note (Signed)
Hep C screen today 

## 2023-09-17 NOTE — Assessment & Plan Note (Signed)
 Lab Results  Component Value Date   HGBA1C 6.6 (H) 09/10/2023   HGBA1C 6.3 (A) 06/19/2023   HGBA1C 6.3 (A) 02/28/2023   Continues metformin  500 mg bid  Will increase trulicity  to 4.5 mg weekly and follow up 3 mo  Microalb utd  Eating better and less Continues statin  Normal foot exam

## 2023-09-18 LAB — HIV ANTIBODY (ROUTINE TESTING W REFLEX): HIV 1&2 Ab, 4th Generation: NONREACTIVE

## 2023-09-18 LAB — HEPATITIS C ANTIBODY: Hepatitis C Ab: NONREACTIVE

## 2023-10-03 ENCOUNTER — Telehealth: Payer: Self-pay | Admitting: *Deleted

## 2023-10-03 DIAGNOSIS — Z1211 Encounter for screening for malignant neoplasm of colon: Secondary | ICD-10-CM

## 2023-10-03 NOTE — Telephone Encounter (Signed)
Called pt and he is okay with PCP putting in referral to get him back to [facility] for his colonoscopy.

## 2023-10-03 NOTE — Telephone Encounter (Signed)
-----   Message from [facility] sent at 09/17/2023  7:19 PM EST ----- Please let pt know he will be due for 5 year colonoscopy at end of the month Would he like a referral?

## 2023-10-03 NOTE — Telephone Encounter (Signed)
The referral is in  Please give him # to call [facility] in [place] to call and schedule

## 2023-10-04 NOTE — Telephone Encounter (Signed)
Called patient reviewed instructions and provided contact number. Will reach out if any questions.

## 2023-10-07 ENCOUNTER — Encounter: Payer: Self-pay | Admitting: Family Medicine

## 2023-10-28 ENCOUNTER — Encounter: Payer: Self-pay | Admitting: Internal Medicine

## 2023-11-05 ENCOUNTER — Other Ambulatory Visit: Payer: Self-pay | Admitting: Family Medicine

## 2023-11-19 ENCOUNTER — Ambulatory Visit (AMBULATORY_SURGERY_CENTER): Payer: BC Managed Care – PPO | Admitting: *Deleted

## 2023-11-19 VITALS — Ht 73.5 in | Wt 338.0 lb

## 2023-11-19 DIAGNOSIS — Z860100 Personal history of colon polyps, unspecified: Secondary | ICD-10-CM

## 2023-11-19 MED ORDER — SUFLAVE 178.7 G PO SOLR: 1.0000 | Freq: Once | ORAL | 0 refills | Status: AC

## 2023-11-19 NOTE — Progress Notes (Signed)
 Pt's name and DOB verified at the beginning of the pre-visit wit 2 identifiers  Pt denies any difficulty with ambulating,sitting, laying down or rolling side to side  Pt has no issues with ambulation   Pt has no issues moving head neck or swallowing  No egg or soy allergy known to patient   No issues known to pt with past sedation with any surgeries or procedures  Pt denies having issues being intubated  No FH of Malignant Hyperthermia  Pt is not on diet pills or shots  Pt is not on home 02   Pt is not on blood thinners   Pt denies issues with constipation   Pt is not on dialysis  Pt denise any abnormal heart rhythms   Pt denies any upcoming cardiac testing  Patient's chart reviewed by Cathlyn Parsons CNRA prior to pre-visit and patient appropriate for the [facility].  Pre-visit completed and red dot placed by patient's name on their procedure day (on provider's schedule).    If you use chewing tobacco or snuff, please DO NOT use after midnight the day of your procedure. If you do, your procedure will be cancelled !!   Visit by phone  Pt states weight is 338 lb  IInstructions reviewed. Pt given [facility], [facility] main # and MD on call # prior to instructions.  Pt states understanding of instructions. Instructed to review again prior to procedure. Pt states they will.   Informed pt that they will receive a text or  call from [facility] regarding there prep med.

## 2023-11-20 ENCOUNTER — Telehealth: Payer: Self-pay | Admitting: Internal Medicine

## 2023-11-20 NOTE — Telephone Encounter (Signed)
 Patient rescheduled form 12/04/23 to 12/18/23

## 2023-12-04 ENCOUNTER — Encounter: Payer: BC Managed Care – PPO | Admitting: Internal Medicine

## 2023-12-16 ENCOUNTER — Encounter: Payer: Self-pay | Admitting: Internal Medicine

## 2023-12-16 ENCOUNTER — Ambulatory Visit: Payer: BC Managed Care – PPO | Admitting: Family Medicine

## 2023-12-18 ENCOUNTER — Ambulatory Visit (AMBULATORY_SURGERY_CENTER): Admitting: Internal Medicine

## 2023-12-18 ENCOUNTER — Encounter: Payer: Self-pay | Admitting: Internal Medicine

## 2023-12-18 ENCOUNTER — Other Ambulatory Visit: Payer: Self-pay | Admitting: Internal Medicine

## 2023-12-18 VITALS — BP 127/63 | HR 84 | Temp 97.9°F | Resp 15 | Ht 73.5 in | Wt 338.0 lb

## 2023-12-18 DIAGNOSIS — Z1211 Encounter for screening for malignant neoplasm of colon: Secondary | ICD-10-CM

## 2023-12-18 DIAGNOSIS — K6389 Other specified diseases of intestine: Secondary | ICD-10-CM | POA: Diagnosis not present

## 2023-12-18 DIAGNOSIS — D122 Benign neoplasm of ascending colon: Secondary | ICD-10-CM | POA: Diagnosis not present

## 2023-12-18 DIAGNOSIS — K5730 Diverticulosis of large intestine without perforation or abscess without bleeding: Secondary | ICD-10-CM | POA: Diagnosis not present

## 2023-12-18 DIAGNOSIS — Z860100 Personal history of colon polyps, unspecified: Secondary | ICD-10-CM

## 2023-12-18 MED ORDER — SODIUM CHLORIDE 0.9 % IV SOLN: 500.0000 mL | INTRAVENOUS | Status: DC

## 2023-12-18 NOTE — Op Note (Signed)
 [facility] Patient Name: Charles Watkins Procedure Date: 12/18/2023 1:56 PM MRN: [id number] Endoscopist: Wilhemina Bonito. Marina Goodell , MD, [id number] Age: 58 Referring MD:  Date of Birth: 11-Nov-1965 Gender: Male Account #: [id number] Procedure:                Colonoscopy with cold snare polypectomy x 2 Indications:              High risk colon cancer surveillance: Personal                            history of multiple (3 or more) adenomas (2020) Medicines:                Monitored Anesthesia Care Procedure:                Pre-Anesthesia Assessment:                           - Prior to the procedure, a History and Physical                            was performed, and patient medications and                            allergies were reviewed. The patient's tolerance of                            previous anesthesia was also reviewed. The risks                            and benefits of the procedure and the sedation                            options and risks were discussed with the patient.                            All questions were answered, and informed consent                            was obtained. Prior Anticoagulants: The patient has                            taken no anticoagulant or antiplatelet agents. ASA                            Grade Assessment: II - A patient with mild systemic                            disease. After reviewing the risks and benefits,                            the patient was deemed in satisfactory condition to                            undergo the procedure.  After obtaining informed consent, the colonoscope                            was passed under direct vision. Throughout the                            procedure, the patient's blood pressure, pulse, and                            oxygen saturations were monitored continuously. The                            Olympus Scope [id number] was introduced through the                             anus and advanced to the the cecum, identified by                            appendiceal orifice and ileocecal valve. The                            ileocecal valve, appendiceal orifice, and rectum                            were photographed. The quality of the bowel                            preparation was good. The colonoscopy was performed                            without difficulty. The patient tolerated the                            procedure well. The bowel preparation used was                            SUPREP via split dose instruction. Scope In: 2:09:41 PM Scope Out: 2:26:58 PM Scope Withdrawal Time: 0 hours 15 minutes 5 seconds  Total Procedure Duration: 0 hours 17 minutes 17 seconds  Findings:                 Two polyps were found in the ascending colon. The                            polyps were 2 to 3 mm in size. These polyps were                            removed with a cold snare. Resection and retrieval                            were complete.                           Multiple diverticula were found in the sigmoid  colon.                           The exam was otherwise without abnormality on                            direct and retroflexion views. Complications:            No immediate complications. Estimated blood loss:                            None. Estimated Blood Loss:     Estimated blood loss: none. Impression:               - Two 2 to 3 mm polyps in the ascending colon,                            removed with a cold snare. Resected and retrieved.                           - Diverticulosis in the sigmoid colon.                           - The examination was otherwise normal on direct                            and retroflexion views. Recommendation:           - Repeat colonoscopy in 5 years for surveillance.                           - Patient has a contact number available for                             emergencies. The signs and symptoms of potential                            delayed complications were discussed with the                            patient. Return to normal activities tomorrow.                            Written discharge instructions were provided to the                            patient.                           - Resume previous diet.                           - Continue present medications.                           - Await pathology results. Wilhemina Bonito. Marina Goodell, MD 12/18/2023 2:31:28 PM This report has been signed electronically.

## 2023-12-18 NOTE — Progress Notes (Signed)
 HISTORY OF PRESENT ILLNESS:  Charles Watkins is a 58 y.o. male with a history of multiple adenomatous colon polyps.  Prior exam 2020.  Now for follow-up surveillance colonoscopy  REVIEW OF SYSTEMS:  All non-GI ROS negative except for  Past Medical History:  Diagnosis Date   Allergic rhinitis    Allergy    Arthritis    knee   Diabetes mellitus without complication (HCC)    GERD (gastroesophageal reflux disease)    Hyperglycemia    Hyperlipidemia    Hypertension    Other fatigue    Shortness of breath on exertion     Past Surgical History:  Procedure Laterality Date   Baker's cyst - aspirated  03/2005   COLONOSCOPY     KNEE ARTHROSCOPY     WISDOM TOOTH EXTRACTION      Social History Charles Watkins  reports that he quit smoking about 12 years ago. His smoking use included cigarettes. He started smoking about 36 years ago. He has a 24 pack-year smoking history. His smokeless tobacco use includes chew. He reports current alcohol use. He reports that he does not use drugs.  family history includes Alcoholism in his father; Brain cancer in his mother; Cancer in his mother; Heart disease in his mother; Hypertension in his mother; Stroke in his mother; Thyroid disease in his mother.  Allergies  Allergen Reactions   Lisinopril Cough       PHYSICAL EXAMINATION: Vital signs: BP (!) 140/79   Pulse 86   Temp 97.9 F (36.6 C)   Ht 6' 1.5" (1.867 m)   Wt (!) 338 lb (153.3 kg)   SpO2 96%   BMI 43.99 kg/m  General: Well-developed, well-nourished, no acute distress HEENT: Sclerae are anicteric, conjunctiva pink. Oral mucosa intact Lungs: Clear Heart: Regular Abdomen: soft, nontender, nondistended, no obvious ascites, no peritoneal signs, normal bowel sounds. No organomegaly. Extremities: No edema Psychiatric: alert and oriented x3. Cooperative     ASSESSMENT:  History of multiple adenomatous colon polyps   PLAN:   Surveillance colonoscopy

## 2023-12-18 NOTE — Progress Notes (Signed)
 Report to PACU, RN, vss, BBS= Clear.

## 2023-12-18 NOTE — Patient Instructions (Signed)

## 2023-12-18 NOTE — Progress Notes (Signed)
 Pt's states no medical or surgical changes since previsit or office visit.

## 2023-12-19 ENCOUNTER — Telehealth: Payer: Self-pay

## 2023-12-19 NOTE — Telephone Encounter (Signed)
 Post procedure follow up call, no answer

## 2023-12-24 ENCOUNTER — Encounter: Payer: Self-pay | Admitting: Internal Medicine

## 2023-12-24 LAB — SURGICAL PATHOLOGY

## 2023-12-25 ENCOUNTER — Encounter: Payer: Self-pay | Admitting: Family Medicine

## 2023-12-25 ENCOUNTER — Ambulatory Visit (INDEPENDENT_AMBULATORY_CARE_PROVIDER_SITE_OTHER): Admitting: Family Medicine

## 2023-12-25 VITALS — BP 126/78 | HR 102 | Temp 98.6°F | Ht 73.5 in | Wt 339.5 lb

## 2023-12-25 DIAGNOSIS — R6882 Decreased libido: Secondary | ICD-10-CM | POA: Diagnosis not present

## 2023-12-25 DIAGNOSIS — Z7985 Long-term (current) use of injectable non-insulin antidiabetic drugs: Secondary | ICD-10-CM | POA: Diagnosis not present

## 2023-12-25 DIAGNOSIS — I10 Essential (primary) hypertension: Secondary | ICD-10-CM

## 2023-12-25 DIAGNOSIS — E1169 Type 2 diabetes mellitus with other specified complication: Secondary | ICD-10-CM | POA: Diagnosis not present

## 2023-12-25 DIAGNOSIS — E785 Hyperlipidemia, unspecified: Secondary | ICD-10-CM

## 2023-12-25 DIAGNOSIS — E119 Type 2 diabetes mellitus without complications: Secondary | ICD-10-CM | POA: Diagnosis not present

## 2023-12-25 LAB — POCT GLYCOSYLATED HEMOGLOBIN (HGB A1C): Hemoglobin A1C: 6 % — AB (ref 4.0–5.6)

## 2023-12-25 NOTE — Progress Notes (Signed)
 Subjective:    Patient ID: Charles Watkins, male    DOB: 1966/06/06, 58 y.o.   MRN: [id number]  HPI  Wt Readings from Last 3 Encounters:  12/25/23 (!) 339 lb 8 oz (154 kg)  12/18/23 (!) 338 lb (153.3 kg)  11/19/23 (!) 338 lb (153.3 kg)   44.18 kg/m  Vitals:   12/25/23 1354  BP: 126/78  Pulse: (!) 102  Temp: 98.6 F (37 C)  SpO2: 97%     Pt presents for follow up of DM2 and HTN and chronic medical problems  Also decreased libido   HTN bp is stable today  No cp or palpitations or headaches or edema  No side effects to co  BP Readings from Last 3 Encounters:  12/25/23 126/78  12/18/23 127/63  09/17/23 126/70     Amlodipine  10 mg daily  Losartan  50 mg daily  Chlorthalidone  25 mg daily   Lab Results  Component Value Date   NA 138 09/10/2023   K 3.5 09/10/2023   CO2 28 09/10/2023   GLUCOSE 141 (H) 09/10/2023   BUN 12 09/10/2023   CREATININE 1.06 09/10/2023   CALCIUM  9.6 09/10/2023   GFR 78.48 09/10/2023   EGFR 86 06/26/2021   GFRNONAA 76.04 06/19/2010     DM2 Lab Results  Component Value Date   HGBA1C 6.0 (A) 12/25/2023   HGBA1C 6.6 (H) 09/10/2023   HGBA1C 6.3 (A) 06/19/2023  Improved A1c   Metformin  500 mg bid  Trulicity  weekly 4.5 mg   No issues with blood sugar   Eating less in general - due to trulicity   Not a lot of sweets or junk food   Exercise- walking more  Some housework     Lab Results  Component Value Date   MICROALBUR 1.5 06/19/2023   MICROALBUR 3.0 (H) 05/11/2022   Normal ratio    Hyperlipidemia  Lab Results  Component Value Date   CHOL 133 09/10/2023   HDL 40.80 09/10/2023   LDLCALC 65 09/10/2023   LDLDIRECT 117.0 08/10/2016   TRIG 138.0 09/10/2023   CHOLHDL 3 09/10/2023   Fenofibrate  134 mg daily  Atorvastatin  10 mg daily    Had his colonoscopy done -polyps and 5 y recall   Despite feeling good lower libido  Some erectile issues     Patient Active Problem List   Diagnosis Date Noted   Prostate  cancer screening 08/03/2015    Priority: 15.   Decreased libido 12/25/2023   Encounter for hepatitis C screening test for low risk patient 09/17/2023   Encounter for screening for HIV 09/17/2023   GERD (gastroesophageal reflux disease) 08/29/2022   Vitamin D  deficiency 02/27/2021   Colon cancer screening 08/18/2018   Left knee pain 02/10/2018   Diabetes mellitus treated with injections of non-insulin  medication (HCC) 11/04/2017   H/O motion sickness 11/04/2017   Routine general medical examination at a health care facility 03/22/2013   Morbid obesity (HCC) 09/23/2012   Essential hypertension 08/14/2007   Allergic rhinitis 08/14/2007   BAKER'S CYST 08/14/2007   Sleep apnea 08/14/2007   Hyperlipidemia associated with type 2 diabetes mellitus (HCC) 01/16/2007   Past Medical History:  Diagnosis Date   Allergic rhinitis    Allergy    Arthritis    knee   Diabetes mellitus without complication (HCC)    GERD (gastroesophageal reflux disease)    Hyperglycemia    Hyperlipidemia    Hypertension    Other fatigue    Shortness of breath on exertion  Past Surgical History:  Procedure Laterality Date   Baker's cyst - aspirated  03/2005   COLONOSCOPY     KNEE ARTHROSCOPY     WISDOM TOOTH EXTRACTION     Social History   Tobacco Use   Smoking status: Former    Current packs/day: 0.00    Average packs/day: 1 pack/day for 24.0 years (24.0 ttl pk-yrs)    Types: Cigarettes    Start date: 08/06/1987    Quit date: 08/06/2011    Years since quitting: 12.3   Smokeless tobacco: Current    Types: Chew   Tobacco comments:    non smoker  Vaping Use   Vaping status: Never Used  Substance Use Topics   Alcohol use: Yes    Alcohol/week: 0.0 standard drinks of alcohol    Comment: drinks on weekend   Drug use: No   Family History  Problem Relation Age of Onset   Heart disease Mother    Thyroid  disease Mother    Hypertension Mother    Brain cancer Mother    Stroke Mother    Cancer  Mother    Alcoholism Father    Colon cancer Neg Hx    Colon polyps Neg Hx    Esophageal cancer Neg Hx    Rectal cancer Neg Hx    Stomach cancer Neg Hx    Allergies  Allergen Reactions   Lisinopril  Cough   Current Outpatient Medications on File Prior to Visit  Medication Sig Dispense Refill   amLODipine  (NORVASC ) 10 MG tablet Take 1 tablet (10 mg total) by mouth daily. 90 tablet 3   aspirin 81 MG tablet Take 81 mg by mouth daily.     atorvastatin  (LIPITOR) 10 MG tablet Take 1 tablet (10 mg total) by mouth every evening. 90 tablet 3   cetirizine (ZYRTEC) 10 MG tablet Take 10 mg by mouth every other day.     chlorthalidone  (HYGROTON ) 25 MG tablet Take 1 tablet (25 mg total) by mouth daily. 90 tablet 3   Dulaglutide  (TRULICITY ) 4.5 MG/0.5ML SOAJ Inject 4.5 mg as directed once a week. 6 mL 2   esomeprazole  (NEXIUM ) 20 MG capsule TAKE 1 CAPSULE BY MOUTH ONCE  DAILY AS NEEDED 90 capsule 1   fenofibrate  micronized (LOFIBRA) 134 MG capsule Take 1 capsule (134 mg total) by mouth daily. 90 capsule 3   losartan  (COZAAR ) 50 MG tablet Take 1 tablet (50 mg total) by mouth daily. 90 tablet 3   metFORMIN  (GLUCOPHAGE ) 500 MG tablet Take 1 tablet (500 mg total) by mouth 2 (two) times daily with a meal. 180 tablet 3   Multiple Vitamin (MULTIVITAMIN) tablet Take 1 tablet by mouth daily.     potassium chloride  (KLOR-CON  M) 10 MEQ tablet TAKE 1 TABLET BY MOUTH TWICE  DAILY 180 tablet 0   No current facility-administered medications on file prior to visit.    Review of Systems  Constitutional:  Negative for activity change, appetite change, fatigue, fever and unexpected weight change.  HENT:  Negative for congestion, rhinorrhea, sore throat and trouble swallowing.   Eyes:  Negative for pain, redness, itching and visual disturbance.  Respiratory:  Negative for cough, chest tightness, shortness of breath and wheezing.   Cardiovascular:  Negative for chest pain and palpitations.  Gastrointestinal:  Negative  for abdominal pain, blood in stool, constipation, diarrhea and nausea.  Endocrine: Negative for cold intolerance, heat intolerance, polydipsia and polyuria.  Genitourinary:  Negative for difficulty urinating, dysuria, frequency and urgency.  Musculoskeletal:  Negative for arthralgias, joint swelling and myalgias.  Skin:  Negative for pallor and rash.  Neurological:  Negative for dizziness, tremors, weakness, numbness and headaches.  Hematological:  Negative for adenopathy. Does not bruise/bleed easily.  Psychiatric/Behavioral:  Negative for decreased concentration and dysphoric mood. The patient is not nervous/anxious.        Objective:   Physical Exam Constitutional:      General: He is not in acute distress.    Appearance: Normal appearance. He is well-developed. He is obese. He is not ill-appearing or diaphoretic.  HENT:     Head: Normocephalic and atraumatic.  Eyes:     Conjunctiva/sclera: Conjunctivae normal.     Pupils: Pupils are equal, round, and reactive to light.  Neck:     Thyroid : No thyromegaly.     Vascular: No carotid bruit or JVD.  Cardiovascular:     Rate and Rhythm: Normal rate and regular rhythm.     Heart sounds: Normal heart sounds.     No gallop.  Pulmonary:     Effort: Pulmonary effort is normal. No respiratory distress.     Breath sounds: Normal breath sounds. No wheezing or rales.  Abdominal:     General: There is no distension or abdominal bruit.     Palpations: Abdomen is soft.  Musculoskeletal:     Cervical back: Normal range of motion and neck supple.     Right lower leg: No edema.     Left lower leg: No edema.  Lymphadenopathy:     Cervical: No cervical adenopathy.  Skin:    General: Skin is warm and dry.     Coloration: Skin is not pale.     Findings: No rash.  Neurological:     Mental Status: He is alert.     Coordination: Coordination normal.     Deep Tendon Reflexes: Reflexes are normal and symmetric. Reflexes normal.  Psychiatric:         Mood and Affect: Mood normal.           Assessment & Plan:   Problem List Items Addressed This Visit       Cardiovascular and Mediastinum   Essential hypertension   bp in fair control at this time  BP Readings from Last 1 Encounters:  12/25/23 126/78   No changes needed Most recent labs reviewed  Disc lifstyle change with low sodium diet and exercise  Plan to continue  Amlodipine  10 mg daily  Losartan  50 mg daily  chlorthalidone  25 mg daily  Bmet today   Commended better health habits         Relevant Orders   Basic metabolic panel with GFR     Endocrine   Hyperlipidemia associated with type 2 diabetes mellitus (HCC)   Disc goals for lipids and reasons to control them Rev last labs with pt Rev low sat fat diet in detail  LDL of 65 HDL imporved   Plan to continue atorvastatin  10 mg daily  Fenofibrate  134 mg daily       Diabetes mellitus treated with injections of non-insulin  medication (HCC) - Primary   Lab Results  Component Value Date   HGBA1C 6.0 (A) 12/25/2023   HGBA1C 6.6 (H) 09/10/2023   HGBA1C 6.3 (A) 06/19/2023   Commended on improvement with better diet and exercise  Continues Metformin  500 mg bid Trulicity  4.5 mg weekly -which helps appetite Encouraged to keep working on weight loss  Eye exam and microal utd  Normal foot exam /good care  Relevant Orders   POCT HgB A1C (Completed)     Other   Decreased libido   With mild erectile problems In setting of HTN and DM2/ controlled  Denies depression /mood is very good   Testosterone  level today      Relevant Orders   Testosterone    Other Visit Diagnoses       Type 2 diabetes mellitus with other specified complication, without long-term current use of insulin  (HCC)

## 2023-12-25 NOTE — Assessment & Plan Note (Signed)
 Disc goals for lipids and reasons to control them Rev last labs with pt Rev low sat fat diet in detail  LDL of 65 HDL imporved   Plan to continue atorvastatin 10 mg daily  Fenofibrate 134 mg daily

## 2023-12-25 NOTE — Patient Instructions (Addendum)
 Keep walking  Add some strength training to your routine, this is important for bone and brain health and can reduce your risk of falls and help your body use insulin  properly and regulate weight  Light weights, exercise bands , and internet videos are a good way to start  Yoga (chair or regular), machines , floor exercises or a gym with machines are also good options    A1c (glucose control) is improved today  Keep up the good work  Keep working on [employer] and exercise for weight loss  No changes in medicine   Labs for testosterone  (for decreased libido) today  We will reach out with results   Follow up in 6 months with fasting labs prior

## 2023-12-25 NOTE — Assessment & Plan Note (Signed)
 Lab Results  Component Value Date   HGBA1C 6.0 (A) 12/25/2023   HGBA1C 6.6 (H) 09/10/2023   HGBA1C 6.3 (A) 06/19/2023   Commended on improvement with better diet and exercise  Continues Metformin  500 mg bid Trulicity  4.5 mg weekly -which helps appetite Encouraged to keep working on weight loss  Eye exam and microal utd  Normal foot exam /good care

## 2023-12-25 NOTE — Assessment & Plan Note (Signed)
 With mild erectile problems In setting of HTN and DM2/ controlled  Denies depression /mood is very good   Testosterone  level today

## 2023-12-25 NOTE — Assessment & Plan Note (Signed)
 bp in fair control at this time  BP Readings from Last 1 Encounters:  12/25/23 126/78   No changes needed Most recent labs reviewed  Disc lifstyle change with low sodium diet and exercise  Plan to continue  Amlodipine  10 mg daily  Losartan  50 mg daily  chlorthalidone  25 mg daily  Bmet today   Commended better health habits

## 2023-12-26 LAB — BASIC METABOLIC PANEL WITH GFR
BUN: 13 mg/dL (ref 6–23)
CO2: 26 meq/L (ref 19–32)
Calcium: 9.7 mg/dL (ref 8.4–10.5)
Chloride: 103 meq/L (ref 96–112)
Creatinine, Ser: 1.03 mg/dL (ref 0.40–1.50)
GFR: 81.06 mL/min
Glucose, Bld: 92 mg/dL (ref 70–99)
Potassium: 3.7 meq/L (ref 3.5–5.1)
Sodium: 137 meq/L (ref 135–145)

## 2023-12-26 LAB — TESTOSTERONE: Testosterone: 294.99 ng/dL — ABNORMAL LOW (ref 300.00–890.00)

## 2024-01-29 ENCOUNTER — Other Ambulatory Visit: Payer: Self-pay | Admitting: Family Medicine

## 2024-04-11 ENCOUNTER — Other Ambulatory Visit: Payer: Self-pay | Admitting: Family Medicine

## 2024-04-13 NOTE — Telephone Encounter (Signed)
 Last filled on 09/17/23 #6 mL/ 2 refills   F/u scheduled 06/26/24

## 2024-06-19 ENCOUNTER — Other Ambulatory Visit

## 2024-06-22 ENCOUNTER — Telehealth: Payer: Self-pay | Admitting: Family Medicine

## 2024-06-22 DIAGNOSIS — Z7985 Long-term (current) use of injectable non-insulin antidiabetic drugs: Secondary | ICD-10-CM

## 2024-06-22 DIAGNOSIS — I10 Essential (primary) hypertension: Secondary | ICD-10-CM

## 2024-06-22 DIAGNOSIS — E1169 Type 2 diabetes mellitus with other specified complication: Secondary | ICD-10-CM

## 2024-06-22 NOTE — Telephone Encounter (Signed)
-----   Message from Veva JINNY Ferrari sent at 06/08/2024  2:46 PM EDT ----- Regarding: Lab orders for Wed, 10.22.25 Lab orders for a 6 month follow up appt

## 2024-06-24 ENCOUNTER — Ambulatory Visit: Payer: Self-pay | Admitting: Family Medicine

## 2024-06-24 ENCOUNTER — Other Ambulatory Visit (INDEPENDENT_AMBULATORY_CARE_PROVIDER_SITE_OTHER)

## 2024-06-24 DIAGNOSIS — I10 Essential (primary) hypertension: Secondary | ICD-10-CM | POA: Diagnosis not present

## 2024-06-24 DIAGNOSIS — E1169 Type 2 diabetes mellitus with other specified complication: Secondary | ICD-10-CM

## 2024-06-24 DIAGNOSIS — Z7985 Long-term (current) use of injectable non-insulin antidiabetic drugs: Secondary | ICD-10-CM | POA: Diagnosis not present

## 2024-06-24 DIAGNOSIS — E785 Hyperlipidemia, unspecified: Secondary | ICD-10-CM | POA: Diagnosis not present

## 2024-06-24 DIAGNOSIS — E119 Type 2 diabetes mellitus without complications: Secondary | ICD-10-CM | POA: Diagnosis not present

## 2024-06-24 LAB — MICROALBUMIN / CREATININE URINE RATIO
Creatinine,U: 315 mg/dL
Microalb Creat Ratio: 5.7 mg/g (ref 0.0–30.0)
Microalb, Ur: 1.8 mg/dL (ref 0.0–1.9)

## 2024-06-24 LAB — COMPREHENSIVE METABOLIC PANEL WITH GFR
ALT: 23 U/L (ref 0–53)
AST: 17 U/L (ref 0–37)
Albumin: 4.3 g/dL (ref 3.5–5.2)
Alkaline Phosphatase: 49 U/L (ref 39–117)
BUN: 16 mg/dL (ref 6–23)
CO2: 25 meq/L (ref 19–32)
Calcium: 9.9 mg/dL (ref 8.4–10.5)
Chloride: 100 meq/L (ref 96–112)
Creatinine, Ser: 1.16 mg/dL (ref 0.40–1.50)
GFR: 70.04 mL/min (ref >60.00)
Glucose, Bld: 124 mg/dL — ABNORMAL HIGH (ref 70–99)
Potassium: 3.6 meq/L (ref 3.5–5.1)
Sodium: 135 meq/L (ref 135–145)
Total Bilirubin: 0.4 mg/dL (ref 0.2–1.2)
Total Protein: 8.1 g/dL (ref 6.0–8.3)

## 2024-06-24 LAB — LIPID PANEL
Cholesterol: 138 mg/dL (ref 0–200)
HDL: 44.4 mg/dL (ref >39.00)
LDL Cholesterol: 66 mg/dL (ref 0–99)
NonHDL: 93.28
Total CHOL/HDL Ratio: 3
Triglycerides: 138 mg/dL (ref 0.0–149.0)
VLDL: 27.6 mg/dL (ref 0.0–40.0)

## 2024-06-24 LAB — HEMOGLOBIN A1C: Hgb A1c MFr Bld: 6.3 % (ref 4.6–6.5)

## 2024-06-26 ENCOUNTER — Ambulatory Visit: Admitting: Family Medicine

## 2024-07-01 ENCOUNTER — Ambulatory Visit (INDEPENDENT_AMBULATORY_CARE_PROVIDER_SITE_OTHER): Admitting: Family Medicine

## 2024-07-01 VITALS — BP 124/80 | HR 110 | Temp 98.0°F | Ht 73.5 in | Wt 342.0 lb

## 2024-07-01 DIAGNOSIS — Z23 Encounter for immunization: Secondary | ICD-10-CM

## 2024-07-01 DIAGNOSIS — E1169 Type 2 diabetes mellitus with other specified complication: Secondary | ICD-10-CM | POA: Diagnosis not present

## 2024-07-01 DIAGNOSIS — E785 Hyperlipidemia, unspecified: Secondary | ICD-10-CM

## 2024-07-01 DIAGNOSIS — E119 Type 2 diabetes mellitus without complications: Secondary | ICD-10-CM | POA: Diagnosis not present

## 2024-07-01 DIAGNOSIS — Z7985 Long-term (current) use of injectable non-insulin antidiabetic drugs: Secondary | ICD-10-CM

## 2024-07-01 DIAGNOSIS — I10 Essential (primary) hypertension: Secondary | ICD-10-CM

## 2024-07-01 NOTE — Progress Notes (Signed)
 Subjective:    Patient ID: Charles Watkins, male    DOB: 1966/03/25, 58 y.o.   MRN: [id number]  HPI  Wt Readings from Last 3 Encounters:  07/01/24 (!) 342 lb (155.1 kg)  12/25/23 (!) 339 lb 8 oz (154 kg)  12/18/23 (!) 338 lb (153.3 kg)   44.51 kg/m  Vitals:   07/01/24 1600 07/01/24 1626  BP: 136/84 124/80  Pulse: (!) 110   Temp: 98 F (36.7 C)   SpO2: 94%    Pt presents for 6 mo follow up of chronic health problems  HTN DM2 Hyperlipidemia Morbid obesity   Retired  Working around the house     HTN bp is stable today  No cp or palpitations or headaches or edema  No side effects to medicines  BP Readings from Last 3 Encounters:  07/01/24 124/80  12/25/23 126/78  12/18/23 127/63    Amlodipine  10 mg daily  Losartan  50 mg daily  Chlorthalidone  25 mg daily  Lab Results  Component Value Date   NA 135 06/24/2024   K 3.6 06/24/2024   CO2 25 06/24/2024   GLUCOSE 124 (H) 06/24/2024   BUN 16 06/24/2024   CREATININE 1.16 06/24/2024   CALCIUM  9.9 06/24/2024   GFR 70.04 06/24/2024   EGFR 86 06/26/2021   GFRNONAA 76.04 06/19/2010    Diabetes Home sugar results -not checking  No symptoms of hypoglycemia   DM diet  So so  Not good this month / keeps candy in the house   Some sweets / gets cravings  Eating more vegetables than fruits  Protein- chicken/ steak (once per week)/ meat sauce    Exercise  Once per week -goes walking - for about 30-40 minutes  Some house work    Lab Results  Component Value Date   HGBA1C 6.3 06/24/2024   HGBA1C 6.0 (A) 12/25/2023   HGBA1C 6.6 (H) 09/10/2023   Lab Results  Component Value Date   MICROALBUR 1.8 06/24/2024  Ratio 5.7   Renal protection-arb Last eye exam - due  Flu shot today  Metformin  500 mg bid  Trulicity  4.5 mg weekly   Hyperlipidemia Lab Results  Component Value Date   CHOL 138 06/24/2024   CHOL 133 09/10/2023   CHOL 127 02/28/2023   Lab Results  Component Value Date   HDL 44.40  06/24/2024   HDL 40.80 09/10/2023   HDL 38.70 (L) 02/28/2023   Lab Results  Component Value Date   LDLCALC 66 06/24/2024   LDLCALC 65 09/10/2023   LDLCALC 67 02/28/2023   Lab Results  Component Value Date   TRIG 138.0 06/24/2024   TRIG 138.0 09/10/2023   TRIG 104.0 02/28/2023   Lab Results  Component Value Date   CHOLHDL 3 06/24/2024   CHOLHDL 3 09/10/2023   CHOLHDL 3 02/28/2023   Lab Results  Component Value Date   LDLDIRECT 117.0 08/10/2016   LDLDIRECT 130.7 09/28/2013   LDLDIRECT 136.5 03/12/2012   Atorvastatin  10 mg daily  Fenofibrate  134 mg daily    Patient Active Problem List   Diagnosis Date Noted   Prostate cancer screening 08/03/2015    Priority: 15.   Decreased libido 12/25/2023   Encounter for hepatitis C screening test for low risk patient 09/17/2023   Encounter for screening for HIV 09/17/2023   GERD (gastroesophageal reflux disease) 08/29/2022   Vitamin D  deficiency 02/27/2021   Colon cancer screening 08/18/2018   Left knee pain 02/10/2018   Diabetes mellitus treated with injections of  non-insulin  medication (HCC) 11/04/2017   H/O motion sickness 11/04/2017   Routine general medical examination at a health care facility 03/22/2013   Morbid obesity (HCC) 09/23/2012   Essential hypertension 08/14/2007   Allergic rhinitis 08/14/2007   BAKER'S CYST 08/14/2007   Sleep apnea 08/14/2007   Hyperlipidemia associated with type 2 diabetes mellitus (HCC) 01/16/2007   Past Medical History:  Diagnosis Date   Allergic rhinitis    Allergy    Arthritis    knee   Diabetes mellitus without complication (HCC)    GERD (gastroesophageal reflux disease)    Hyperglycemia    Hyperlipidemia    Hypertension    Other fatigue    Shortness of breath on exertion    Past Surgical History:  Procedure Laterality Date   Baker's cyst - aspirated  03/2005   COLONOSCOPY     KNEE ARTHROSCOPY     WISDOM TOOTH EXTRACTION     Social History   Tobacco Use   Smoking  status: Former    Current packs/day: 0.00    Average packs/day: 1 pack/day for 24.0 years (24.0 ttl pk-yrs)    Types: Cigarettes    Start date: 08/06/1987    Quit date: 08/06/2011    Years since quitting: 12.9   Smokeless tobacco: Current    Types: Chew   Tobacco comments:    non smoker  Vaping Use   Vaping status: Never Used  Substance Use Topics   Alcohol use: Yes    Alcohol/week: 0.0 standard drinks of alcohol    Comment: drinks on weekend   Drug use: No   Family History  Problem Relation Age of Onset   Heart disease Mother    Thyroid  disease Mother    Hypertension Mother    Brain cancer Mother    Stroke Mother    Cancer Mother    Alcoholism Father    Colon cancer Neg Hx    Colon polyps Neg Hx    Esophageal cancer Neg Hx    Rectal cancer Neg Hx    Stomach cancer Neg Hx    Allergies  Allergen Reactions   Lisinopril  Cough   Current Outpatient Medications on File Prior to Visit  Medication Sig Dispense Refill   amLODipine  (NORVASC ) 10 MG tablet TAKE 1 TABLET BY MOUTH DAILY 90 tablet 2   aspirin 81 MG tablet Take 81 mg by mouth daily.     atorvastatin  (LIPITOR) 10 MG tablet TAKE 1 TABLET BY MOUTH IN THE  EVENING 90 tablet 2   cetirizine (ZYRTEC) 10 MG tablet Take 10 mg by mouth every other day.     chlorthalidone  (HYGROTON ) 25 MG tablet TAKE 1 TABLET BY MOUTH DAILY 90 tablet 2   Dulaglutide  (TRULICITY ) 4.5 MG/0.5ML SOAJ INJECT THE CONTENTS OF ONE PEN  SUBCUTANEOUSLY WEEKLY AS  DIRECTED 6 mL 0   esomeprazole  (NEXIUM ) 20 MG capsule TAKE 1 CAPSULE BY MOUTH ONCE  DAILY AS NEEDED 90 capsule 1   fenofibrate  micronized (LOFIBRA) 134 MG capsule TAKE 1 CAPSULE BY MOUTH DAILY 90 capsule 2   losartan  (COZAAR ) 50 MG tablet TAKE 1 TABLET BY MOUTH DAILY 90 tablet 2   metFORMIN  (GLUCOPHAGE ) 500 MG tablet TAKE 1 TABLET BY MOUTH TWICE  DAILY WITH MEALS 180 tablet 2   Multiple Vitamin (MULTIVITAMIN) tablet Take 1 tablet by mouth daily.     potassium chloride  (KLOR-CON  M) 10 MEQ tablet  TAKE 1 TABLET BY MOUTH TWICE  DAILY 180 tablet 2   No current facility-administered medications on  file prior to visit.    Review of Systems  Constitutional:  Negative for activity change, appetite change, fatigue, fever and unexpected weight change.  HENT:  Negative for congestion, rhinorrhea, sore throat and trouble swallowing.   Eyes:  Negative for pain, redness, itching and visual disturbance.  Respiratory:  Negative for cough, chest tightness, shortness of breath and wheezing.   Cardiovascular:  Negative for chest pain and palpitations.  Gastrointestinal:  Negative for abdominal pain, blood in stool, constipation, diarrhea and nausea.  Endocrine: Negative for cold intolerance, heat intolerance, polydipsia and polyuria.  Genitourinary:  Negative for difficulty urinating, dysuria, frequency and urgency.  Musculoskeletal:  Negative for arthralgias, joint swelling and myalgias.  Skin:  Negative for pallor and rash.  Neurological:  Negative for dizziness, tremors, weakness, numbness and headaches.  Hematological:  Negative for adenopathy. Does not bruise/bleed easily.  Psychiatric/Behavioral:  Negative for decreased concentration and dysphoric mood. The patient is not nervous/anxious.        Objective:   Physical Exam Constitutional:      General: He is not in acute distress.    Appearance: He is well-developed.  HENT:     Head: Normocephalic and atraumatic.  Eyes:     Conjunctiva/sclera: Conjunctivae normal.     Pupils: Pupils are equal, round, and reactive to light.  Neck:     Thyroid : No thyromegaly.     Vascular: No carotid bruit or JVD.  Cardiovascular:     Rate and Rhythm: Normal rate and regular rhythm.     Heart sounds:     No gallop.  Pulmonary:     Effort: Pulmonary effort is normal. No respiratory distress.     Breath sounds: Normal breath sounds. No wheezing or rales.  Abdominal:     General: There is no distension or abdominal bruit.     Palpations: Abdomen is  soft.  Musculoskeletal:     Cervical back: Normal range of motion and neck supple.     Right lower leg: No edema.     Left lower leg: No edema.  Lymphadenopathy:     Cervical: No cervical adenopathy.  Skin:    General: Skin is warm and dry.     Coloration: Skin is not pale.     Findings: No rash.  Neurological:     Mental Status: He is alert.     Coordination: Coordination normal.     Deep Tendon Reflexes: Reflexes are normal and symmetric. Reflexes normal.  Psychiatric:        Mood and Affect: Mood normal.     Comments: Low motivation to get moving           Assessment & Plan:   Problem List Items Addressed This Visit       Cardiovascular and Mediastinum   Essential hypertension   bp in fair control at this time  BP Readings from Last 1 Encounters:  07/01/24 124/80   No changes needed Most recent labs reviewed  Disc lifstyle change with low sodium diet and exercise  Plan to continue  Amlodipine  10 mg daily  Losartan  50 mg daily  chlorthalidone  25 mg daily  Commended better health habits  Encouraged to add more exercise          Endocrine   Hyperlipidemia associated with type 2 diabetes mellitus (HCC)   Disc goals for lipids and reasons to control them Rev last labs with pt Rev low sat fat diet in detail  LDL of 66 HDL imporved  Plan to continue atorvastatin  10 mg daily  Fenofibrate  134 mg daily       Diabetes mellitus treated with injections of non-insulin  medication (HCC) - Primary   Lab Results  Component Value Date   HGBA1C 6.3 06/24/2024   HGBA1C 6.0 (A) 12/25/2023   HGBA1C 6.6 (H) 09/10/2023   Microalb utd  Encouraged to schedule eye exam  Flu shot given   Metformin  500 mg bid Trulicity  4.5 mg weekly   Encouraged more exercise  Low glycemic diet        Other Visit Diagnoses       Need for influenza vaccination       Relevant Orders   Flu vaccine trivalent PF, 6mos and older(Flulaval,Afluria,Fluarix,Fluzone) (Completed)

## 2024-07-01 NOTE — Assessment & Plan Note (Signed)
 Disc goals for lipids and reasons to control them Rev last labs with pt Rev low sat fat diet in detail  LDL of 66 HDL imporved   Plan to continue atorvastatin  10 mg daily  Fenofibrate  134 mg daily

## 2024-07-01 NOTE — Patient Instructions (Addendum)
  Eat more produce and protein Avoid added sugars in your diet when you can  Try to get most of your carbohydrates from produce (with the exception of white potatoes) and whole grains Eat less bread/pasta/rice/snack foods/cereals/sweets and other items from the middle of the grocery store (processed carbs)  The following are examples of protein in diet (lean) Meat (lean)  Fish  Eggs  Dairy products  Soy products  Oat milk  Almond milk Nuts and nut butters  Dried beans (very good)    Try and walk more often 30 minutes  Add some strength training to your routine, this is important for bone and brain health and can reduce your risk of falls and help your body use insulin  properly and regulate weight  Light weights, exercise bands , and internet videos are a good way to start  Yoga (chair or regular), machines , floor exercises or a gym with machines are also good options  Use your weight set at least 3 days per week - make yourself a schedule   This will help diabetes and other medical problems and also prevent muscle loss   Flu shot today   Schedule annual exam in 3-4 months (am appointment, don't eat heavy that day)

## 2024-07-01 NOTE — Assessment & Plan Note (Signed)
 Lab Results  Component Value Date   HGBA1C 6.3 06/24/2024   HGBA1C 6.0 (A) 12/25/2023   HGBA1C 6.6 (H) 09/10/2023   Microalb utd  Encouraged to schedule eye exam  Flu shot given   Metformin  500 mg bid Trulicity  4.5 mg weekly   Encouraged more exercise  Low glycemic diet

## 2024-07-01 NOTE — Assessment & Plan Note (Addendum)
 bp in fair control at this time  BP Readings from Last 1 Encounters:  07/01/24 124/80   No changes needed Most recent labs reviewed  Disc lifstyle change with low sodium diet and exercise  Plan to continue  Amlodipine  10 mg daily  Losartan  50 mg daily  chlorthalidone  25 mg daily  Commended better health habits  Encouraged to add more exercise

## 2024-07-06 ENCOUNTER — Other Ambulatory Visit: Payer: Self-pay | Admitting: Family Medicine

## 2024-09-23 ENCOUNTER — Other Ambulatory Visit: Payer: Self-pay | Admitting: Family Medicine

## 2024-11-02 ENCOUNTER — Encounter: Admitting: Family Medicine
